# Patient Record
Sex: Female | Born: 1940 | Race: Black or African American | Hispanic: No | Smoking: Former smoker
Health system: Southern US, Community
[De-identification: ages and names within clinical notes are randomized; demographics above are authoritative.]

## PROBLEM LIST (undated history)

## (undated) DIAGNOSIS — G459 Transient cerebral ischemic attack, unspecified: Secondary | ICD-10-CM

## (undated) DIAGNOSIS — M4800 Spinal stenosis, site unspecified: Secondary | ICD-10-CM

## (undated) DIAGNOSIS — E119 Type 2 diabetes mellitus without complications: Secondary | ICD-10-CM

## (undated) DIAGNOSIS — C8590 Non-Hodgkin lymphoma, unspecified, unspecified site: Secondary | ICD-10-CM

## (undated) DIAGNOSIS — I10 Essential (primary) hypertension: Secondary | ICD-10-CM

## (undated) DIAGNOSIS — K219 Gastro-esophageal reflux disease without esophagitis: Secondary | ICD-10-CM

## (undated) DIAGNOSIS — E785 Hyperlipidemia, unspecified: Secondary | ICD-10-CM

## (undated) DIAGNOSIS — I219 Acute myocardial infarction, unspecified: Secondary | ICD-10-CM

## (undated) DIAGNOSIS — M1990 Unspecified osteoarthritis, unspecified site: Secondary | ICD-10-CM

## (undated) HISTORY — DX: Type 2 diabetes mellitus without complications: E11.9

## (undated) HISTORY — PX: TOE AMPUTATION: SHX809

## (undated) HISTORY — DX: Unspecified osteoarthritis, unspecified site: M19.90

## (undated) HISTORY — DX: Essential (primary) hypertension: I10

## (undated) HISTORY — PX: EYE SURGERY: SHX253

## (undated) HISTORY — PX: ABDOMINAL HYSTERECTOMY: SHX81

---

## 2003-09-16 ENCOUNTER — Other Ambulatory Visit: Payer: Self-pay

## 2007-01-04 DIAGNOSIS — C8590 Non-Hodgkin lymphoma, unspecified, unspecified site: Secondary | ICD-10-CM

## 2007-01-04 HISTORY — DX: Non-Hodgkin lymphoma, unspecified, unspecified site: C85.90

## 2008-08-17 ENCOUNTER — Emergency Department: Payer: Self-pay | Admitting: Internal Medicine

## 2010-01-15 ENCOUNTER — Emergency Department: Payer: Self-pay | Admitting: Emergency Medicine

## 2010-01-23 ENCOUNTER — Inpatient Hospital Stay: Payer: Self-pay | Admitting: Internal Medicine

## 2010-02-17 ENCOUNTER — Ambulatory Visit: Payer: Self-pay | Admitting: Surgery

## 2010-02-19 ENCOUNTER — Ambulatory Visit: Payer: Self-pay | Admitting: Surgery

## 2010-03-31 ENCOUNTER — Ambulatory Visit: Payer: Self-pay | Admitting: Surgery

## 2010-04-02 ENCOUNTER — Ambulatory Visit: Payer: Self-pay | Admitting: Surgery

## 2010-04-07 LAB — PATHOLOGY REPORT

## 2011-03-15 ENCOUNTER — Ambulatory Visit: Payer: Self-pay | Admitting: Oncology

## 2011-03-15 LAB — BASIC METABOLIC PANEL
Anion Gap: 12 (ref 7–16)
BUN: 21 mg/dL — ABNORMAL HIGH (ref 7–18)
Chloride: 96 mmol/L — ABNORMAL LOW (ref 98–107)
Co2: 27 mmol/L (ref 21–32)
Creatinine: 1.88 mg/dL — ABNORMAL HIGH (ref 0.60–1.30)
EGFR (African American): 34 — ABNORMAL LOW
EGFR (Non-African Amer.): 28 — ABNORMAL LOW
Potassium: 3.5 mmol/L (ref 3.5–5.1)
Sodium: 135 mmol/L — ABNORMAL LOW (ref 136–145)

## 2011-03-15 LAB — CBC CANCER CENTER
Basophil #: 0 x10 3/mm (ref 0.0–0.1)
Eosinophil #: 0.1 x10 3/mm (ref 0.0–0.7)
HCT: 36.8 % (ref 35.0–47.0)
Lymphocyte %: 22 %
MCH: 31.3 pg (ref 26.0–34.0)
MCHC: 35 g/dL (ref 32.0–36.0)
Monocyte #: 0.4 x10 3/mm (ref 0.0–0.7)
Monocyte %: 6.4 %
Neutrophil #: 4.6 x10 3/mm (ref 1.4–6.5)
Platelet: 177 x10 3/mm (ref 150–440)
RDW: 13.7 % (ref 11.5–14.5)
WBC: 6.5 x10 3/mm (ref 3.6–11.0)

## 2011-03-29 ENCOUNTER — Emergency Department: Payer: Self-pay | Admitting: Emergency Medicine

## 2011-03-29 LAB — URINALYSIS, COMPLETE
Bilirubin,UR: NEGATIVE
Leukocyte Esterase: NEGATIVE
Nitrite: NEGATIVE
Ph: 5 (ref 4.5–8.0)
Protein: NEGATIVE
RBC,UR: 1 /HPF (ref 0–5)
Specific Gravity: 1.025 (ref 1.003–1.030)
WBC UR: 2 /HPF (ref 0–5)

## 2011-03-29 LAB — COMPREHENSIVE METABOLIC PANEL
Alkaline Phosphatase: 86 U/L (ref 50–136)
Anion Gap: 12 (ref 7–16)
BUN: 22 mg/dL — ABNORMAL HIGH (ref 7–18)
Bilirubin,Total: 0.5 mg/dL (ref 0.2–1.0)
Calcium, Total: 9.6 mg/dL (ref 8.5–10.1)
Co2: 29 mmol/L (ref 21–32)
Creatinine: 1.6 mg/dL — ABNORMAL HIGH (ref 0.60–1.30)
EGFR (African American): 41 — ABNORMAL LOW
Glucose: 445 mg/dL — ABNORMAL HIGH (ref 65–99)
Osmolality: 291 (ref 275–301)
Potassium: 3.4 mmol/L — ABNORMAL LOW (ref 3.5–5.1)
SGOT(AST): 21 U/L (ref 15–37)
SGPT (ALT): 16 U/L
Total Protein: 8.2 g/dL (ref 6.4–8.2)

## 2011-03-29 LAB — CBC
HCT: 38.1 % (ref 35.0–47.0)
MCH: 30.7 pg (ref 26.0–34.0)
MCV: 90 fL (ref 80–100)
Platelet: 176 10*3/uL (ref 150–440)
RBC: 4.21 10*6/uL (ref 3.80–5.20)
WBC: 6.5 10*3/uL (ref 3.6–11.0)

## 2011-04-04 ENCOUNTER — Ambulatory Visit: Payer: Self-pay | Admitting: Oncology

## 2011-07-28 DIAGNOSIS — G4733 Obstructive sleep apnea (adult) (pediatric): Secondary | ICD-10-CM | POA: Insufficient documentation

## 2011-07-28 DIAGNOSIS — E785 Hyperlipidemia, unspecified: Secondary | ICD-10-CM | POA: Insufficient documentation

## 2011-07-28 DIAGNOSIS — Z859 Personal history of malignant neoplasm, unspecified: Secondary | ICD-10-CM | POA: Insufficient documentation

## 2011-07-28 DIAGNOSIS — E1151 Type 2 diabetes mellitus with diabetic peripheral angiopathy without gangrene: Secondary | ICD-10-CM | POA: Insufficient documentation

## 2011-07-28 DIAGNOSIS — H3530 Unspecified macular degeneration: Secondary | ICD-10-CM | POA: Insufficient documentation

## 2011-07-28 DIAGNOSIS — Z89429 Acquired absence of other toe(s), unspecified side: Secondary | ICD-10-CM | POA: Insufficient documentation

## 2011-07-28 DIAGNOSIS — R32 Unspecified urinary incontinence: Secondary | ICD-10-CM | POA: Insufficient documentation

## 2011-07-28 DIAGNOSIS — E559 Vitamin D deficiency, unspecified: Secondary | ICD-10-CM | POA: Insufficient documentation

## 2011-07-28 DIAGNOSIS — I2510 Atherosclerotic heart disease of native coronary artery without angina pectoris: Secondary | ICD-10-CM | POA: Insufficient documentation

## 2011-07-28 DIAGNOSIS — H409 Unspecified glaucoma: Secondary | ICD-10-CM | POA: Insufficient documentation

## 2011-11-29 ENCOUNTER — Ambulatory Visit: Payer: Self-pay | Admitting: Ophthalmology

## 2011-11-29 LAB — CBC WITH DIFFERENTIAL/PLATELET
Basophil #: 0.1 10*3/uL (ref 0.0–0.1)
HCT: 34.2 % — ABNORMAL LOW (ref 35.0–47.0)
Lymphocyte %: 24.5 %
MCH: 31.3 pg (ref 26.0–34.0)
MCHC: 35.2 g/dL (ref 32.0–36.0)
Monocyte #: 0.7 x10 3/mm (ref 0.2–0.9)
Monocyte %: 7.6 %
Neutrophil #: 6.1 10*3/uL (ref 1.4–6.5)
Neutrophil %: 66.1 %
RDW: 13.8 % (ref 11.5–14.5)
WBC: 9.3 10*3/uL (ref 3.6–11.0)

## 2011-11-29 LAB — BASIC METABOLIC PANEL
Anion Gap: 6 — ABNORMAL LOW (ref 7–16)
BUN: 24 mg/dL — ABNORMAL HIGH (ref 7–18)
Calcium, Total: 9.5 mg/dL (ref 8.5–10.1)
Co2: 28 mmol/L (ref 21–32)
Creatinine: 1.47 mg/dL — ABNORMAL HIGH (ref 0.60–1.30)
Glucose: 97 mg/dL (ref 65–99)
Potassium: 3.6 mmol/L (ref 3.5–5.1)
Sodium: 141 mmol/L (ref 136–145)

## 2011-12-05 DIAGNOSIS — F419 Anxiety disorder, unspecified: Secondary | ICD-10-CM | POA: Insufficient documentation

## 2011-12-07 ENCOUNTER — Ambulatory Visit: Payer: Self-pay | Admitting: Ophthalmology

## 2012-01-04 ENCOUNTER — Ambulatory Visit: Payer: Self-pay | Admitting: Oncology

## 2012-01-26 LAB — CBC CANCER CENTER
Basophil #: 0.1 x10 3/mm (ref 0.0–0.1)
Basophil %: 0.5 %
Eosinophil #: 0.2 x10 3/mm (ref 0.0–0.7)
Eosinophil %: 1.6 %
HCT: 36.3 % (ref 35.0–47.0)
HGB: 12.5 g/dL (ref 12.0–16.0)
Lymphocyte #: 2.9 x10 3/mm (ref 1.0–3.6)
Monocyte #: 0.7 x10 3/mm (ref 0.2–0.9)
Platelet: 213 x10 3/mm (ref 150–440)
RBC: 4.15 10*6/uL (ref 3.80–5.20)
RDW: 13.9 % (ref 11.5–14.5)
WBC: 10.9 x10 3/mm (ref 3.6–11.0)

## 2012-01-30 DIAGNOSIS — M1990 Unspecified osteoarthritis, unspecified site: Secondary | ICD-10-CM | POA: Insufficient documentation

## 2012-02-04 ENCOUNTER — Ambulatory Visit: Payer: Self-pay | Admitting: Oncology

## 2012-03-03 ENCOUNTER — Ambulatory Visit: Payer: Self-pay | Admitting: Oncology

## 2012-05-24 DIAGNOSIS — F09 Unspecified mental disorder due to known physiological condition: Secondary | ICD-10-CM | POA: Insufficient documentation

## 2012-07-24 ENCOUNTER — Ambulatory Visit: Payer: Self-pay | Admitting: Oncology

## 2012-07-26 LAB — CBC CANCER CENTER
Basophil #: 0 x10 3/mm (ref 0.0–0.1)
Basophil %: 0.5 %
HGB: 11.8 g/dL — ABNORMAL LOW (ref 12.0–16.0)
Lymphocyte #: 1.9 x10 3/mm (ref 1.0–3.6)
Lymphocyte %: 26.7 %
MCHC: 35.5 g/dL (ref 32.0–36.0)
MCV: 89 fL (ref 80–100)
Monocyte #: 0.6 x10 3/mm (ref 0.2–0.9)
Monocyte %: 8.2 %
Neutrophil #: 4.6 x10 3/mm (ref 1.4–6.5)
Neutrophil %: 63.5 %
Platelet: 190 x10 3/mm (ref 150–440)
RBC: 3.74 10*6/uL — ABNORMAL LOW (ref 3.80–5.20)
WBC: 7.2 x10 3/mm (ref 3.6–11.0)

## 2012-07-26 LAB — BASIC METABOLIC PANEL
Anion Gap: 10 (ref 7–16)
Calcium, Total: 9.3 mg/dL (ref 8.5–10.1)
Chloride: 98 mmol/L (ref 98–107)
Co2: 31 mmol/L (ref 21–32)
Osmolality: 293 (ref 275–301)
Potassium: 3.6 mmol/L (ref 3.5–5.1)
Sodium: 139 mmol/L (ref 136–145)

## 2012-08-03 ENCOUNTER — Ambulatory Visit: Payer: Self-pay | Admitting: Oncology

## 2012-08-27 DIAGNOSIS — M7061 Trochanteric bursitis, right hip: Secondary | ICD-10-CM | POA: Insufficient documentation

## 2012-09-06 DIAGNOSIS — M546 Pain in thoracic spine: Secondary | ICD-10-CM | POA: Insufficient documentation

## 2012-09-10 ENCOUNTER — Ambulatory Visit: Payer: Self-pay | Admitting: Specialist

## 2012-09-20 ENCOUNTER — Ambulatory Visit: Payer: Self-pay | Admitting: Specialist

## 2012-11-05 DIAGNOSIS — I70209 Unspecified atherosclerosis of native arteries of extremities, unspecified extremity: Secondary | ICD-10-CM | POA: Insufficient documentation

## 2012-11-26 ENCOUNTER — Ambulatory Visit: Payer: Self-pay | Admitting: Oncology

## 2013-01-24 ENCOUNTER — Ambulatory Visit: Payer: Self-pay | Admitting: Pain Medicine

## 2013-02-11 ENCOUNTER — Ambulatory Visit: Payer: Self-pay | Admitting: Family

## 2013-03-20 DIAGNOSIS — M5137 Other intervertebral disc degeneration, lumbosacral region: Secondary | ICD-10-CM | POA: Insufficient documentation

## 2013-04-25 ENCOUNTER — Ambulatory Visit: Payer: Self-pay | Admitting: Pain Medicine

## 2013-05-13 ENCOUNTER — Ambulatory Visit: Payer: Self-pay | Admitting: Pain Medicine

## 2013-06-20 ENCOUNTER — Ambulatory Visit: Payer: Self-pay | Admitting: Pain Medicine

## 2013-08-15 ENCOUNTER — Ambulatory Visit: Payer: Self-pay | Admitting: Pain Medicine

## 2013-08-26 ENCOUNTER — Ambulatory Visit: Payer: Self-pay | Admitting: Pain Medicine

## 2013-09-24 ENCOUNTER — Ambulatory Visit: Payer: Self-pay | Admitting: Pain Medicine

## 2013-10-02 ENCOUNTER — Ambulatory Visit: Payer: Self-pay | Admitting: Pain Medicine

## 2013-10-11 ENCOUNTER — Emergency Department: Payer: Self-pay | Admitting: Emergency Medicine

## 2013-10-11 LAB — CBC
HCT: 35.5 % (ref 35.0–47.0)
HGB: 11.8 g/dL — AB (ref 12.0–16.0)
MCH: 29.6 pg (ref 26.0–34.0)
MCHC: 33.3 g/dL (ref 32.0–36.0)
MCV: 89 fL (ref 80–100)
PLATELETS: 132 10*3/uL — AB (ref 150–440)
RBC: 4 10*6/uL (ref 3.80–5.20)
RDW: 13.8 % (ref 11.5–14.5)
WBC: 5.2 10*3/uL (ref 3.6–11.0)

## 2013-10-11 LAB — BASIC METABOLIC PANEL
Anion Gap: 8 (ref 7–16)
BUN: 21 mg/dL — ABNORMAL HIGH (ref 7–18)
Calcium, Total: 8.9 mg/dL (ref 8.5–10.1)
Chloride: 104 mmol/L (ref 98–107)
Co2: 33 mmol/L — ABNORMAL HIGH (ref 21–32)
Creatinine: 1.52 mg/dL — ABNORMAL HIGH (ref 0.60–1.30)
EGFR (African American): 43 — ABNORMAL LOW
EGFR (Non-African Amer.): 36 — ABNORMAL LOW
Glucose: 154 mg/dL — ABNORMAL HIGH (ref 65–99)
Osmolality: 295 (ref 275–301)
Potassium: 3.9 mmol/L (ref 3.5–5.1)
Sodium: 145 mmol/L (ref 136–145)

## 2013-10-11 LAB — TROPONIN I: Troponin-I: <0.02 ng/mL

## 2013-10-29 ENCOUNTER — Ambulatory Visit: Payer: Self-pay | Admitting: Pain Medicine

## 2013-11-06 ENCOUNTER — Ambulatory Visit: Payer: Self-pay | Admitting: Pain Medicine

## 2013-11-25 DIAGNOSIS — Z9181 History of falling: Secondary | ICD-10-CM | POA: Insufficient documentation

## 2013-11-25 DIAGNOSIS — H540X55 Blindness right eye category 5, blindness left eye category 5: Secondary | ICD-10-CM | POA: Insufficient documentation

## 2013-12-05 ENCOUNTER — Ambulatory Visit: Payer: Self-pay | Admitting: Pain Medicine

## 2014-01-09 ENCOUNTER — Ambulatory Visit: Payer: Self-pay | Admitting: Pain Medicine

## 2014-01-28 DIAGNOSIS — R51 Headache: Secondary | ICD-10-CM

## 2014-01-28 DIAGNOSIS — R519 Headache, unspecified: Secondary | ICD-10-CM | POA: Insufficient documentation

## 2014-03-11 ENCOUNTER — Ambulatory Visit: Payer: Self-pay | Admitting: Pain Medicine

## 2014-03-24 ENCOUNTER — Ambulatory Visit: Payer: Self-pay | Admitting: Pain Medicine

## 2014-04-07 DIAGNOSIS — R42 Dizziness and giddiness: Secondary | ICD-10-CM | POA: Insufficient documentation

## 2014-04-22 NOTE — Op Note (Signed)
PATIENT NAME:  [name], [name] MR#:  [id number] DATE OF BIRTH:  Aug 24, 1940  DATE OF PROCEDURE:  12/07/2011  PROCEDURES PERFORMED:  1. Pars plana vitrectomy of the right eye.  2. Internal limiting membrane peel of the right eye.   PREOPERATIVE DIAGNOSES:  1. Persistent diabetic macular edema.  2. Proliferative diabetic retinopathy.   POSTOPERATIVE DIAGNOSES:  1. Persistent diabetic macular edema.  2. Proliferative diabetic retinopathy.   ESTIMATED BLOOD LOSS: Less than 1 mL.  PRIMARY SURGEON: Teresa Pelton. Cynda Soule, MD  ANESTHESIA: General endotracheal anesthesia with a supplemental retrobulbar block of the right eye.   COMPLICATIONS: None.   INDICATIONS FOR PROCEDURE: This is a patient who presented to my office with decreased vision in the right eye. Multiple attempts were made to control her diabetic macular edema including intravitreal injections of multiple medications, focal retinal laser and subtenon Kenalog. None of this seemed to work. Risks, benefits, and alternatives of the above procedure were discussed and the patient wished to proceed.   DETAILS OF PROCEDURE: After informed consent was obtained, the patient was brought into the operative suite at [facility]. Patient was placed in supine position, was induced by the anesthesia team without any complications. The right eye was prepped and draped in sterile manner. After lid speculum was inserted, a small subtenons wound was created inferonasally. A subtenons tract was created using [place]. A retrobulbar block was performed via this tract. A 25-gauge trocar was then placed inferotemporally through displaced conjunctiva in an oblique fashion 3 mm beyond the limbus. The infusion cannula was turned on and inserted through the trocar and secured in position with Steri-Strips. Two more trocars were placed in a similar fashion superotemporally and superonasally. The vitreous cutter and light pipe were introduced  into the eye and a core vitrectomy was performed. Peripheral vitreous was trimmed for 360 degrees. Indocyanine green was injected onto the posterior pole and removed within 30 seconds. An internal limiting membrane peel was completed for 360 degrees around the fovea for a total of two disk diameters. Scleral depressed exam was performed for 360 degrees and no signs of any breaks or tears could be identified. A partial air-fluid exchange was performed and the trocars were removed. The wounds were noted to be airtight. 5 mg of dexamethasone was given into the inferior fornix. Pressure in the eye was confirmed to be approximately 15 mmHg and the lid speculum was removed. The eye was cleaned and TobraDex was placed in the eye. A patch and shield were placed over the eye and the patient was reversed by anesthesia and taken to postanesthesia care with instructions to remain head up.    ____________________________ Teresa Pelton. Starling Manns, MD mfa:cms D: 12/07/2011 10:57:54 ET T: 12/07/2011 11:22:37 ET JOB#: [id number]  cc: Teresa Pelton. Starling Manns, MD, <Dictator>  Coralee Rud MD ELECTRONICALLY SIGNED 12/21/2011 9:36

## 2014-04-24 ENCOUNTER — Ambulatory Visit
Admit: 2014-04-24 | Disposition: A | Discharge status: Home / Self Care | Payer: Self-pay | Attending: Pain Medicine | Admitting: Pain Medicine

## 2014-06-24 ENCOUNTER — Ambulatory Visit: Payer: Medicare (Managed Care) | Attending: Pain Medicine | Admitting: Pain Medicine

## 2014-06-24 ENCOUNTER — Encounter: Payer: Self-pay | Admitting: Pain Medicine

## 2014-06-24 VITALS — BP 121/59 | HR 66 | Temp 98.1°F | Resp 20 | Ht 64.0 in | Wt 234.0 lb

## 2014-06-24 DIAGNOSIS — M545 Low back pain, unspecified: Secondary | ICD-10-CM | POA: Insufficient documentation

## 2014-06-24 DIAGNOSIS — E1140 Type 2 diabetes mellitus with diabetic neuropathy, unspecified: Secondary | ICD-10-CM | POA: Insufficient documentation

## 2014-06-24 DIAGNOSIS — M4800 Spinal stenosis, site unspecified: Secondary | ICD-10-CM | POA: Insufficient documentation

## 2014-06-24 DIAGNOSIS — M79605 Pain in left leg: Secondary | ICD-10-CM | POA: Diagnosis present

## 2014-06-24 DIAGNOSIS — M48062 Spinal stenosis, lumbar region with neurogenic claudication: Secondary | ICD-10-CM

## 2014-06-24 DIAGNOSIS — E1340 Other specified diabetes mellitus with diabetic neuropathy, unspecified: Secondary | ICD-10-CM

## 2014-06-24 DIAGNOSIS — M5136 Other intervertebral disc degeneration, lumbar region: Secondary | ICD-10-CM | POA: Insufficient documentation

## 2014-06-24 DIAGNOSIS — M79604 Pain in right leg: Secondary | ICD-10-CM | POA: Diagnosis present

## 2014-06-24 DIAGNOSIS — M47816 Spondylosis without myelopathy or radiculopathy, lumbar region: Secondary | ICD-10-CM

## 2014-06-24 NOTE — Progress Notes (Signed)
Patient did not bring list of medications and is poor historian.  Dr Primus Bravo notified that we do not have accurate med list.

## 2014-06-24 NOTE — Progress Notes (Signed)
Safety precautions to be maintained throughout the outpatient stay will include: orient to surroundings, keep bed in low position, maintain call bell within reach at all times, provide assistance with transfer out of bed and ambulation.  

## 2014-06-24 NOTE — Patient Instructions (Addendum)
Continue present medications.  F/U PCP for evaliation of  BP and general medical  condition. We will need medical clearance from your primary care physician to proceed with lumbar epidural steroid injection  F/U surgical evaluation. We are scheduling neurosurgical evaluation for evaluation of your lower back lower extremity pain and weakness  F/U neurological evaluation  MRI of lumbar spine. We will obtain updated lumbar MRI to evaluate for disc protrusion, fractures, degenerative changes, and other abnormalities which may be contributing to lower back lower extremity pain and paresthesias  May consider radiofrequency rhizolysis or intraspinal procedures pending response to present treatment and F/U evaluation.  Patient to call Pain Management Center should patient have concerns prior to scheduled return appointment. GENERAL RISKS AND COMPLICATIONS  What are the risk, side effects and possible complications? Generally speaking, most procedures are safe.  However, with any procedure there are risks, side effects, and the possibility of complications.  The risks and complications are dependent upon the sites that are lesioned, or the type of nerve block to be performed.  The closer the procedure is to the spine, the more serious the risks are.  Great care is taken when placing the radio frequency needles, block needles or lesioning probes, but sometimes complications can occur. 1. Infection: Any time there is an injection through the skin, there is a risk of infection.  This is why sterile conditions are used for these blocks.  There are four possible types of infection. 1. Localized skin infection. 2. Central Nervous System Infection-This can be in the form of Meningitis, which can be deadly. 3. Epidural Infections-This can be in the form of an epidural abscess, which can cause pressure inside of the spine, causing compression of the spinal cord with subsequent paralysis. This would require an  emergency surgery to decompress, and there are no guarantees that the patient would recover from the paralysis. 4. Discitis-This is an infection of the intervertebral discs.  It occurs in about 1% of discography procedures.  It is difficult to treat and it may lead to surgery.        2. Pain: the needles have to go through skin and soft tissues, will cause soreness.       3. Damage to internal structures:  The nerves to be lesioned may be near blood vessels or    other nerves which can be potentially damaged.       4. Bleeding: Bleeding is more common if the patient is taking blood thinners such as  aspirin, Coumadin, Ticiid, Plavix, etc., or if he/she have some genetic predisposition  such as hemophilia. Bleeding into the spinal canal can cause compression of the spinal  cord with subsequent paralysis.  This would require an emergency surgery to  decompress and there are no guarantees that the patient would recover from the  paralysis.       5. Pneumothorax:  Puncturing of a lung is a possibility, every time a needle is introduced in  the area of the chest or upper back.  Pneumothorax refers to free air around the  collapsed lung(s), inside of the thoracic cavity (chest cavity).  Another two possible  complications related to a similar event would include: Hemothorax and Chylothorax.   These are variations of the Pneumothorax, where instead of air around the collapsed  lung(s), you may have blood or chyle, respectively.       6. Spinal headaches: They may occur with any procedures in the area of the spine.  7. Persistent CSF (Cerebro-Spinal Fluid) leakage: This is a rare problem, but may occur  with prolonged intrathecal or epidural catheters either due to the formation of a fistulous  track or a dural tear.       8. Nerve damage: By working so close to the spinal cord, there is always a possibility of  nerve damage, which could be as serious as a permanent spinal cord injury with  paralysis.        9. Death:  Although rare, severe deadly allergic reactions known as "Anaphylactic  reaction" can occur to any of the medications used.      10. Worsening of the symptoms:  We can always make thing worse.  What are the chances of something like this happening? Chances of any of this occuring are extremely low.  By statistics, you have more of a chance of getting killed in a motor vehicle accident: while driving to the hospital than any of the above occurring .  Nevertheless, you should be aware that they are possibilities.  In general, it is similar to taking a shower.  Everybody knows that you can slip, hit your head and get killed.  Does that mean that you should not shower again?  Nevertheless always keep in mind that statistics do not mean anything if you happen to be on the wrong side of them.  Even if a procedure has a 1 (one) in a 1,000,000 (million) chance of going wrong, it you happen to be that one..Also, keep in mind that by statistics, you have more of a chance of having something go wrong when taking medications.  Who should not have this procedure? If you are on a blood thinning medication (e.g. Coumadin, Plavix, see list of "Blood Thinners"), or if you have an active infection going on, you should not have the procedure.  If you are taking any blood thinners, please inform your physician.  How should I prepare for this procedure?  Do not eat or drink anything at least six hours prior to the procedure.  Bring a driver with you .  It cannot be a taxi.  Come accompanied by an adult that can drive you back, and that is strong enough to help you if your legs get weak or numb from the local anesthetic.  Take all of your medicines the morning of the procedure with just enough water to swallow them.  If you have diabetes, make sure that you are scheduled to have your procedure done first thing in the morning, whenever possible.  If you have diabetes, take only half of your insulin dose and  notify our nurse that you have done so as soon as you arrive at the clinic.  If you are diabetic, but only take blood sugar pills (oral hypoglycemic), then do not take them on the morning of your procedure.  You may take them after you have had the procedure.  Do not take aspirin or any aspirin-containing medications, at least eleven (11) days prior to the procedure.  They may prolong bleeding.  Wear loose fitting clothing that may be easy to take off and that you would not mind if it got stained with Betadine or blood.  Do not wear any jewelry or perfume  Remove any nail coloring.  It will interfere with some of our monitoring equipment.  NOTE: Remember that this is not meant to be interpreted as a complete list of all possible complications.  Unforeseen problems may occur.  BLOOD THINNERS  The following drugs contain aspirin or other products, which can cause increased bleeding during surgery and should not be taken for 2 weeks prior to and 1 week after surgery.  If you should need take something for relief of minor pain, you may take acetaminophen which is found in Tylenol,m Datril, Anacin-3 and Panadol. It is not blood thinner. The products listed below are.  Do not take any of the products listed below in addition to any listed on your instruction sheet.  A.P.C or A.P.C with Codeine Codeine Phosphate Capsules #3 Ibuprofen Ridaura  ABC compound Congesprin Imuran rimadil  Advil Cope Indocin Robaxisal  Alka-Seltzer Effervescent Pain Reliever and Antacid Coricidin or Coricidin-D  Indomethacin Rufen  Alka-Seltzer plus Cold Medicine Cosprin Ketoprofen S-A-C Tablets  Anacin Analgesic Tablets or Capsules Coumadin Korlgesic Salflex  Anacin Extra Strength Analgesic tablets or capsules CP-2 Tablets Lanoril Salicylate  Anaprox Cuprimine Capsules Levenox Salocol  Anexsia-D Dalteparin Magan Salsalate  Anodynos Darvon compound Magnesium Salicylate Sine-off  Ansaid Dasin Capsules Magsal Sodium  Salicylate  Anturane Depen Capsules Marnal Soma  APF Arthritis pain formula Dewitt's Pills Measurin Stanback  Argesic Dia-Gesic Meclofenamic Sulfinpyrazone  Arthritis Bayer Timed Release Aspirin Diclofenac Meclomen Sulindac  Arthritis pain formula Anacin Dicumarol Medipren Supac  Analgesic (Safety coated) Arthralgen Diffunasal Mefanamic Suprofen  Arthritis Strength Bufferin Dihydrocodeine Mepro Compound Suprol  Arthropan liquid Dopirydamole Methcarbomol with Aspirin Synalgos  ASA tablets/Enseals Disalcid Micrainin Tagament  Ascriptin Doan's Midol Talwin  Ascriptin A/D Dolene Mobidin Tanderil  Ascriptin Extra Strength Dolobid Moblgesic Ticlid  Ascriptin with Codeine Doloprin or Doloprin with Codeine Momentum Tolectin  Asperbuf Duoprin Mono-gesic Trendar  Aspergum Duradyne Motrin or Motrin IB Triminicin  Aspirin plain, buffered or enteric coated Durasal Myochrisine Trigesic  Aspirin Suppositories Easprin Nalfon Trillsate  Aspirin with Codeine Ecotrin Regular or Extra Strength Naprosyn Uracel  Atromid-S Efficin Naproxen Ursinus  Auranofin Capsules Elmiron Neocylate Vanquish  Axotal Emagrin Norgesic Verin  Azathioprine Empirin or Empirin with Codeine Normiflo Vitamin E  Azolid Emprazil Nuprin Voltaren  Bayer Aspirin plain, buffered or children's or timed BC Tablets or powders Encaprin Orgaran Warfarin Sodium  Buff-a-Comp Enoxaparin Orudis Zorpin  Buff-a-Comp with Codeine Equegesic Os-Cal-Gesic   Buffaprin Excedrin plain, buffered or Extra Strength Oxalid   Bufferin Arthritis Strength Feldene Oxphenbutazone   Bufferin plain or Extra Strength Feldene Capsules Oxycodone with Aspirin   Bufferin with Codeine Fenoprofen Fenoprofen Pabalate or Pabalate-SF   Buffets II Flogesic Panagesic   Buffinol plain or Extra Strength Florinal or Florinal with Codeine Panwarfarin   Buf-Tabs Flurbiprofen Penicillamine   Butalbital Compound Four-way cold tablets Penicillin   Butazolidin Fragmin Pepto-Bismol    Carbenicillin Geminisyn Percodan   Carna Arthritis Reliever Geopen Persantine   Carprofen Gold's salt Persistin   Chloramphenicol Goody's Phenylbutazone   Chloromycetin Haltrain Piroxlcam   Clmetidine heparin Plaquenil   Cllnoril Hyco-pap Ponstel   Clofibrate Hydroxy chloroquine Propoxyphen         Before stopping any of these medications, be sure to consult the physician who ordered them.  Some, such as Coumadin (Warfarin) are ordered to prevent or treat serious conditions such as "deep thrombosis", "pumonary embolisms", and other heart problems.  The amount of time that you may need off of the medication may also vary with the medication and the reason for which you were taking it.  If you are taking any of these medications, please make sure you notify your pain physician before you undergo any procedures.         Epidural Steroid Injection  An epidural steroid injection is given to relieve pain in your neck, back, or legs that is caused by the irritation or swelling of a nerve root. This procedure involves injecting a steroid and numbing medicine (anesthetic) into the epidural space. The epidural space is the space between the outer covering of your spinal cord and the bones that form your backbone (vertebra).  LET [facility] CARE PROVIDER KNOW ABOUT:  2. Any allergies you have. 3. All medicines you are taking, including vitamins, herbs, eye drops, creams, and over-the-counter medicines such as aspirin. 4. Previous problems you or members of your family have had with the use of anesthetics. 5. Any blood disorders or blood clotting disorders you have. 6. Previous surgeries you have had. 7. Medical conditions you have. RISKS AND COMPLICATIONS Generally, this is a safe procedure. However, as with any procedure, complications can occur. Possible complications of epidural steroid injection include:  Headache.  Bleeding.  Infection.  Allergic reaction to the medicines.  Damage  to your nerves. The response to this procedure depends on the underlying cause of the pain and its duration. People who have long-term (chronic) pain are less likely to benefit from epidural steroids than are those people whose pain comes on strong and suddenly. BEFORE THE PROCEDURE   Ask your health care provider about changing or stopping your regular medicines. You may be advised to stop taking blood-thinning medicines a few days before the procedure.  You may be given medicines to reduce anxiety.  Arrange for someone to take you home after the procedure. PROCEDURE   You will remain awake during the procedure. You may receive medicine to make you relaxed.  You will be asked to lie on your stomach.  The injection site will be cleaned.  The injection site will be numbed with a medicine (local anesthetic).  A needle will be injected through your skin into the epidural space.  Your health care provider will use an X-ray machine to ensure that the steroid is delivered closest to the affected nerve. You may have minimal discomfort at this time.  Once the needle is in the right position, the local anesthetic and the steroid will be injected into the epidural space.  The needle will then be removed and a bandage will be applied to the injection site. AFTER THE PROCEDURE  12. You may be monitored for a short time before you go home. 13. You may feel weakness or numbness in your arm or leg, which disappears within hours. 14. You may be allowed to eat, drink, and take your regular medicine. 15. You may have soreness at the site of the injection. Document Released: 03/29/2007 Document Revised: 08/22/2012 Document Reviewed: 06/08/2012 [facility] Patient Information 2015 [place], [place]. This information is not intended to replace advice given to you by your health care provider. Make sure you discuss any questions you have with your health care provider.

## 2014-06-24 NOTE — Progress Notes (Signed)
   Subjective:    Patient ID: [name], female    DOB: 1940/10/04, 74 y.o.   MRN: [id number]  HPI  Patient is 74 year old female returns to [facility] for further evaluation and treatment of pain involving the lower back lower extremity region. Patient states that she has lower back lower extremity pain which interferes with ability to walk and perform other activities of daily living without experiencing severe disabling lower back lower extremity pain paresthesias and weakness. We informed patient that we wish to proceed with further evaluations including updated MRI of the lumbar spine as well as schedule patient for neurosurgical evaluation and will consider patient for lumbar epidural steroid injection of the treatment pending medical clearance by patient's primary care physician and pending response to treatment regimen at this time. We will proceed with lumbar MRI schedule neurosurgical evaluation and will consider patient for lumbar epidural steroid injection pending medical clearance the patient undergo procedure and follow-up evaluation and pain management Center. The patient was understanding and agrees to treatment plan     Review of Systems     Objective:   Physical Exam There was tenderness of the splenius capitis and occipitalis musculature regions a mild degree with with palpation of the cervical facet cervical paraspinal musculature region reproducing mild discomfort. Palpation over the region of the thoracic facet thoracic paraspinal musculature region was a tends to palpation of mild degree patient appeared to be without increased pain of significant degree with Tinel and Phalen's maneuver. Palpation over the region of the lumbar facet lumbar paraspinal musculature region was a tends to palpation of moderate degree. With no crepitus of the thoracic region noted. Tinel and Phalen's maneuver without increased pain of significant degree and patient appeared to be with  decreased grip strength. Palpation over the lumbar facet lumbar paraspinal musculature region was a tends to palpation of moderate degree with moderate tenderness over the PSIS and PII S regions. Straight leg raising was tolerates approximately 20 without increase of pain with dorsiflexion noted. There was negative clonus negative Homans. EHL strength slightly decreased without definite sensory deficit of dermatomal distribution detected.  Abdomen soft nontender without excessive tends to palpation and there was no costovertebral angle tenderness noted       Assessment & Plan:  Degenerative disc disease lumbar spine  Spinal stenosis with neurogenic claudication   Diabetes mellitus with diabetic neuropathy   Equal iliac joint dysfunction     Continue present medications.  Lumbar epidural steroid injection Lumbar epidural steroid injection to be performed at time return appointment pending medical clearance for lumbar epidural steroid injection for patient with history of diabetes mellitus hypertension and other abnormalities  F/U PCP for evaliation of  BP and general medical  condition.  F/U surgical evaluation. We scheduled neurosurgical evaluation of lumbar and lower cavity pain and paresthesias and weakness  Lumbar MRI We scheduled  lumbar MRI to evaluate patient's lumbar and lower extremity pain paresthesias and weakness. We will evaluate patient for herniated nucleus pulposus the, degenerative disc disease lumbar spine, facet arthropathy, and other abnormalities to explain patient's symptomatology  F/U neurological evaluation.  May consider radiofrequency rhizolysis or intraspinal procedures pending response to present treatment and F/U evaluation.  Patient to call Pain Management Center should patient have concerns prior to scheduled return appointment.

## 2014-06-24 NOTE — Progress Notes (Signed)
Patient discharge via wheelchair accompanied by CNA Discharge instructions given for procedure.LESI Faxed clearance to Dr Durenda Guthrie to have procedure done

## 2014-07-02 ENCOUNTER — Ambulatory Visit
Admission: RE | Admit: 2014-07-02 | Discharge: 2014-07-02 | Disposition: A | Discharge status: Home / Self Care | Payer: Medicare (Managed Care) | Source: Ambulatory Visit | Attending: Pain Medicine | Admitting: Pain Medicine

## 2014-07-02 DIAGNOSIS — M4806 Spinal stenosis, lumbar region: Secondary | ICD-10-CM | POA: Diagnosis not present

## 2014-07-02 DIAGNOSIS — M5126 Other intervertebral disc displacement, lumbar region: Secondary | ICD-10-CM | POA: Diagnosis not present

## 2014-07-02 DIAGNOSIS — M5136 Other intervertebral disc degeneration, lumbar region: Secondary | ICD-10-CM | POA: Diagnosis present

## 2014-07-02 DIAGNOSIS — E1340 Other specified diabetes mellitus with diabetic neuropathy, unspecified: Secondary | ICD-10-CM

## 2014-07-02 DIAGNOSIS — M47816 Spondylosis without myelopathy or radiculopathy, lumbar region: Secondary | ICD-10-CM

## 2014-07-02 DIAGNOSIS — M79673 Pain in unspecified foot: Secondary | ICD-10-CM | POA: Insufficient documentation

## 2014-07-02 DIAGNOSIS — M48062 Spinal stenosis, lumbar region with neurogenic claudication: Secondary | ICD-10-CM

## 2014-07-03 DIAGNOSIS — M48061 Spinal stenosis, lumbar region without neurogenic claudication: Secondary | ICD-10-CM | POA: Insufficient documentation

## 2014-07-03 NOTE — Progress Notes (Signed)
Attempted to call patient. Mobile number is not valid. Home number was answered by voicemail by someone named  Gerald Stabs. Did not leave a message, as I do not know if this the patient's phone number. No PCP is listed for this patient, cannot send clearance request at this time. Will try home number again later.

## 2014-07-03 NOTE — Progress Notes (Signed)
Attempted to call patient. The mobile number listed is not valid. The home number was answered by voice mail by someone named Gerald Stabs. No message was left, as I was not sure this is a correct number.  No PCP listed, so cannot send clearance request at this time. Will try home number again later.

## 2014-07-03 NOTE — Progress Notes (Signed)
Patient already has appt. For lesi.

## 2014-07-16 ENCOUNTER — Ambulatory Visit: Payer: Medicaid Other | Admitting: Pain Medicine

## 2014-07-21 ENCOUNTER — Ambulatory Visit: Payer: Medicare (Managed Care) | Attending: Pain Medicine | Admitting: Pain Medicine

## 2014-07-21 VITALS — BP 107/63 | HR 74 | Temp 96.9°F | Resp 16 | Ht 65.0 in | Wt 244.0 lb

## 2014-07-21 DIAGNOSIS — M5126 Other intervertebral disc displacement, lumbar region: Secondary | ICD-10-CM | POA: Diagnosis not present

## 2014-07-21 DIAGNOSIS — M79605 Pain in left leg: Secondary | ICD-10-CM | POA: Diagnosis present

## 2014-07-21 DIAGNOSIS — M4806 Spinal stenosis, lumbar region: Secondary | ICD-10-CM | POA: Insufficient documentation

## 2014-07-21 DIAGNOSIS — M48062 Spinal stenosis, lumbar region with neurogenic claudication: Secondary | ICD-10-CM

## 2014-07-21 DIAGNOSIS — E1340 Other specified diabetes mellitus with diabetic neuropathy, unspecified: Secondary | ICD-10-CM

## 2014-07-21 DIAGNOSIS — M545 Low back pain: Secondary | ICD-10-CM | POA: Diagnosis present

## 2014-07-21 DIAGNOSIS — M5136 Other intervertebral disc degeneration, lumbar region: Secondary | ICD-10-CM

## 2014-07-21 DIAGNOSIS — M79604 Pain in right leg: Secondary | ICD-10-CM | POA: Diagnosis present

## 2014-07-21 DIAGNOSIS — M47816 Spondylosis without myelopathy or radiculopathy, lumbar region: Secondary | ICD-10-CM

## 2014-07-21 MED ORDER — MIDAZOLAM HCL 5 MG/5ML IJ SOLN
INTRAMUSCULAR | Status: AC
  Administered 2014-07-21: 1 mg via INTRAVENOUS
  Filled 2014-07-21: qty 5

## 2014-07-21 MED ORDER — LIDOCAINE HCL (PF) 1 % IJ SOLN
INTRAMUSCULAR | Status: AC
  Administered 2014-07-21: 15:00:00
  Filled 2014-07-21: qty 5

## 2014-07-21 MED ORDER — BUPIVACAINE HCL (PF) 0.25 % IJ SOLN
INTRAMUSCULAR | Status: AC
  Filled 2014-07-21: qty 30

## 2014-07-21 MED ORDER — FENTANYL CITRATE (PF) 100 MCG/2ML IJ SOLN
INTRAMUSCULAR | Status: AC
  Filled 2014-07-21: qty 2

## 2014-07-21 MED ORDER — CIPROFLOXACIN HCL 250 MG PO TABS: 250.0000 mg | ORAL_TABLET | Freq: Two times a day (BID) | ORAL | Status: DC

## 2014-07-21 MED ORDER — ORPHENADRINE CITRATE 30 MG/ML IJ SOLN
INTRAMUSCULAR | Status: AC
  Filled 2014-07-21: qty 2

## 2014-07-21 MED ORDER — CIPROFLOXACIN IN D5W 400 MG/200ML IV SOLN
INTRAVENOUS | Status: AC
  Administered 2014-07-21: 400 mg
  Filled 2014-07-21: qty 200

## 2014-07-21 MED ORDER — SODIUM CHLORIDE 0.9 % IJ SOLN
INTRAMUSCULAR | Status: AC
  Administered 2014-07-21: 15:00:00
  Filled 2014-07-21: qty 20

## 2014-07-21 MED ORDER — TRIAMCINOLONE ACETONIDE 40 MG/ML IJ SUSP
INTRAMUSCULAR | Status: AC
  Administered 2014-07-21: 15:00:00
  Filled 2014-07-21: qty 1

## 2014-07-21 NOTE — Patient Instructions (Addendum)
Continue present medications and begin taking Cipro antibiotic today as prescribed  F/U PCP Dr. Coralie Common for evaliation of  BP, diabetes mellitus,  and general medical  condition.  F/U surgical evaluation  F/U neurological evaluation  May consider radiofrequency rhizolysis or intraspinal procedures pending response to present treatment and F/U evaluation.  Patient to call Pain Management Center should patient have concerns prior to scheduled return appointment. Pain Management Discharge Instructions  General Discharge Instructions :  If you need to reach your doctor call: Monday-Friday 8:00 am - 4:00 pm at [phone] or toll free [phone].  After clinic hours [phone] to have operator reach doctor.  Bring all of your medication bottles to all your appointments in the pain clinic.  To cancel or reschedule your appointment with Pain Management please remember to call 24 hours in advance to avoid a fee.  Refer to the educational materials which you have been given on: General Risks, I had my Procedure. Discharge Instructions, Post Sedation.  Post Procedure Instructions:  The drugs you were given will stay in your system until tomorrow, so for the next 24 hours you should not drive, make any legal decisions or drink any alcoholic beverages.  You may eat anything you prefer, but it is better to start with liquids then soups and crackers, and gradually work up to solid foods.  Please notify your doctor immediately if you have any unusual bleeding, trouble breathing or pain that is not related to your normal pain.  Depending on the type of procedure that was done, some parts of your body may feel week and/or numb.  This usually clears up by tonight or the next day.  Walk with the use of an assistive device or accompanied by an adult for the 24 hours.  You may use ice on the affected area for the first 24 hours.  Put ice in a Ziploc bag and cover with a towel and place against  area 15 minutes on 15 minutes off.  You may switch to heat after 24 hours.Epidural Steroid Injection Patient Information  Description: The epidural space surrounds the nerves as they exit the spinal cord.  In some patients, the nerves can be compressed and inflamed by a bulging disc or a tight spinal canal (spinal stenosis).  By injecting steroids into the epidural space, we can bring irritated nerves into direct contact with a potentially helpful medication.  These steroids act directly on the irritated nerves and can reduce swelling and inflammation which often leads to decreased pain.  Epidural steroids may be injected anywhere along the spine and from the neck to the low back depending upon the location of your pain.   After numbing the skin with local anesthetic (like Novocaine), a small needle is passed into the epidural space slowly.  You may experience a sensation of pressure while this is being done.  The entire block usually last less than 10 minutes.  Conditions which may be treated by epidural steroids:   Low back and leg pain  Neck and arm pain  Spinal stenosis  Post-laminectomy syndrome  Herpes zoster (shingles) pain  Pain from compression fractures  Preparation for the injection:  1. Do not eat any solid food or dairy products within 6 hours of your appointment.  2. You may drink clear liquids up to 2 hours before appointment.  Clear liquids include water, black coffee, juice or soda.  No milk or cream please. 3. You may take your regular medication, including pain medications, with a  sip of water before your appointment  Diabetics should hold regular insulin (if taken separately) and take 1/2 normal NPH dos the morning of the procedure.  Carry some sugar containing items with you to your appointment. 4. A driver must accompany you and be prepared to drive you home after your procedure.  5. Bring all your current medications with your. 6. An IV may be inserted and sedation  may be given at the discretion of the physician.   7. A blood pressure cuff, EKG and other monitors will often be applied during the procedure.  Some patients may need to have extra oxygen administered for a short period. 8. You will be asked to provide medical information, including your allergies, prior to the procedure.  We must know immediately if you are taking blood thinners (like Coumadin/Warfarin)  Or if you are allergic to IV iodine contrast (dye). We must know if you could possible be pregnant.  Possible side-effects:  Bleeding from needle site  Infection (rare, may require surgery)  Nerve injury (rare)  Numbness & tingling (temporary)  Difficulty urinating (rare, temporary)  Spinal headache ( a headache worse with upright posture)  Light -headedness (temporary)  Pain at injection site (several days)  Decreased blood pressure (temporary)  Weakness in arm/leg (temporary)  Pressure sensation in back/neck (temporary)  Call if you experience:  Fever/chills associated with headache or increased back/neck pain.  Headache worsened by an upright position.  New onset weakness or numbness of an extremity below the injection site  Hives or difficulty breathing (go to the emergency room)  Inflammation or drainage at the infection site  Severe back/neck pain  Any new symptoms which are concerning to you  Please note:  Although the local anesthetic injected can often make your back or neck feel good for several hours after the injection, the pain will likely return.  It takes 3-7 days for steroids to work in the epidural space.  You may not notice any pain relief for at least that one week.  If effective, we will often do a series of three injections spaced 3-6 weeks apart to maximally decrease your pain.  After the initial series, we generally will wait several months before considering a repeat injection of the same type.  If you have any questions, please call [phone] [facility]

## 2014-07-21 NOTE — Progress Notes (Signed)
Script in hand for cipro.

## 2014-07-21 NOTE — Progress Notes (Signed)
Safety precautions to be maintained throughout the outpatient stay will include: orient to surroundings, keep bed in low position, maintain call bell within reach at all times, provide assistance with transfer out of bed and ambulation.  Called Dr. Durenda Guthrie and received approval for patient to have the lumbar epidural procedure done today; witnessed by Festus Barren, RN at 1235hrs on July 18,2016. Re-faxed the medication request which was sent in June. pATIENT IS FROM [facility] [place]; GETS MEDICATION FROM [facility] [phone]

## 2014-07-21 NOTE — Progress Notes (Signed)
   Subjective:    Patient ID: [name], female    DOB: 22-Jun-1940, 74 y.o.   MRN: [id number]  HPI  PROCEDURE PERFORMED: Lumbar epidural steroid injection   NOTE: The patient is a 74 y.o. female who returns to [place] for further evaluation and treatment of pain involving the lumbar and lower extremity region. MRI revealed the patient to be with increased size of L4-5 disc herniation, now with severe multifactorial spinal stenosis and right greater than left lateral recess stenosis at this level. Unchanged disc and facet degeneration noted elsewhere throughout the spine. The risks, benefits, and expectations of the procedure have been discussed and explained to the patient who was understanding and in agreement with suggested treatment plan. We will proceed with lumbar epidural steroid injection as discussed and as explained to the patient who is willing to proceed with procedure as planned.   DESCRIPTION OF PROCEDURE: Lumbar epidural steroid injection with IV Versed, IV fentanyl conscious sedation, EKG, blood pressure, pulse, and pulse oximetry monitoring. The procedure was performed with the patient in the prone position under fluoroscopic guidance. A local anesthetic skin wheal of 1.5% plain lidocaine was accomplished at proposed entry site. An 18-gauge Tuohy epidural needle was inserted at the L 4 vertebral body level right of the midline via loss-of-resistance technique with negative heme and negative CSF return. A total of 4 mL of Preservative-Free normal saline with 40 mg of Kenalog injected incrementally via epidurally placed needle. Needle was removed.    A total of 40 mg of Kenalog was utilized for the procedure.   The patient tolerated the injection well.    PLAN:   1. Medications: We will continue presently prescribed medications . Patient was given prescription for Cipro to take as prescribed prophylactically 2. Will consider modification of treatment regimen  pending response to treatment rendered on today's visit and follow-up evaluation. 3. The patient is to follow-up with primary care physician, Dr Coralie Common  regarding blood pressure, diabetes mellitus, and general medical condition status post lumbar epidural steroid injection performed on today's visit. 4. Surgical evaluation.. Neurosurgical reevaluation was scheduled for evaluation of patient's lumbar and lower extremity pain and weakness  5. Neurological evaluation. 6. The patient may be a candidate for radiofrequency procedures, implantation device, and other treatment pending response to treatment and follow-up evaluation. 7. The patient has been advised to adhere to proper body mechanics and avoid activities which appear to aggravate condition. 8. The patient has been advised to call the Pain Management Center prior to scheduled return appointment should there be significant change in condition or should there be sign  The patient is understanding and agrees with the suggested  treatment plan     Review of Systems     Objective:   Physical Exam        Assessment & Plan:

## 2014-07-22 ENCOUNTER — Telehealth: Payer: Self-pay | Admitting: *Deleted

## 2014-07-22 NOTE — Telephone Encounter (Signed)
Tried calling number listed.. No voice mail.

## 2014-08-12 ENCOUNTER — Encounter: Payer: Self-pay | Admitting: Pain Medicine

## 2014-08-12 ENCOUNTER — Ambulatory Visit: Payer: Medicare (Managed Care) | Attending: Pain Medicine | Admitting: Pain Medicine

## 2014-08-12 VITALS — BP 105/22 | HR 66 | Temp 98.2°F | Resp 16 | Ht 65.0 in | Wt 232.0 lb

## 2014-08-12 DIAGNOSIS — M5136 Other intervertebral disc degeneration, lumbar region: Secondary | ICD-10-CM | POA: Diagnosis not present

## 2014-08-12 DIAGNOSIS — M79605 Pain in left leg: Secondary | ICD-10-CM | POA: Diagnosis present

## 2014-08-12 DIAGNOSIS — E1140 Type 2 diabetes mellitus with diabetic neuropathy, unspecified: Secondary | ICD-10-CM | POA: Insufficient documentation

## 2014-08-12 DIAGNOSIS — E1340 Other specified diabetes mellitus with diabetic neuropathy, unspecified: Secondary | ICD-10-CM

## 2014-08-12 DIAGNOSIS — M79604 Pain in right leg: Secondary | ICD-10-CM | POA: Diagnosis present

## 2014-08-12 DIAGNOSIS — M47816 Spondylosis without myelopathy or radiculopathy, lumbar region: Secondary | ICD-10-CM

## 2014-08-12 DIAGNOSIS — M4806 Spinal stenosis, lumbar region: Secondary | ICD-10-CM | POA: Diagnosis not present

## 2014-08-12 DIAGNOSIS — M533 Sacrococcygeal disorders, not elsewhere classified: Secondary | ICD-10-CM | POA: Insufficient documentation

## 2014-08-12 DIAGNOSIS — M48062 Spinal stenosis, lumbar region with neurogenic claudication: Secondary | ICD-10-CM

## 2014-08-12 DIAGNOSIS — M545 Low back pain: Secondary | ICD-10-CM | POA: Diagnosis present

## 2014-08-12 NOTE — Progress Notes (Signed)
   Subjective:    Patient ID: [name], female    DOB: 15-Nov-1940, 74 y.o.   MRN: [id number]  HPI  Patient is 74 year old female who returns to [place] for further evaluation and treatment of pain involving the lower back lower extremity region. Patient states that she is unable to stand or walk for any significant period of time due to the severe lower back lower extremity pain which is aggravated by standing and walking. Patient has had significant improvement with prior interventional treatment and Pain Management Center. At this time we will request medical clearance for patient undergo lumbar epidural steroid injection in attempt to decrease severity of symptoms, and last progression of patient's symptoms, and avoid the need for more involved treatment. The patient was understanding and in agreement with suggested treatment plan. Patient denied any trauma change in events of daily living the call significant change in symptomatology.    Review of Systems     Objective:   Physical Exam  There was mild tenderness of the splenius capitis and occipitalis musculature region. There was mild tenderness over the cervical facet cervical paraspinal musculature region as well as the thoracic facet thoracic paraspinal musculature region. There was unremarkable Spurling maneuver. Patient appeared to be with bilaterally equal grip strength. Tinel and Phalen's maneuver were without increase of pain of any significant degree. Palpation over the region of the thoracic paraspinal muscles reproducing thoracic facet region was with mild tends to palpation with no crepitus of the thoracic region noted. Palpation of the lower thoracic paraspinal muscles region was with moderate tenderness to palpation with moderate muscle spasms. Palpation over the lumbar paraspinal muscles region lumbar facet region was with moderate to moderately severe discomfort. Straight leg raising was tolerates  approximately 20 without increased pain dorsiflexion noted. There was questionable decreased EHL strength. There appeared to be decreased sensation of the lower extremities in a stocking type distribution. There was negative clonus negative Homans. There was mild tenderness along the greater trochanteric region iliotibial band region. The abdomen was nontender with no costovertebral angle tenderness noted.    Assessment & Plan:    Degenerative disc disease lumbar spine  Spinal stenosis with neurogenic claudication   Diabetes mellitus with diabetic neuropathy   Sacroiliac joint dysfunction   Plan  Continue present medications  Lumbar epidural steroid injection to be performed at time return appointment pending medical clearance  F/U PCP Dr. Rolena Infante and Dr.Reilly medical clearance for lumbar epidural steroid injection and for evaliation of  BP, diabetes mellitus, and general medical  condition  F/U surgical evaluation as discussed  F/U neurological evaluation  May consider radiofrequency rhizolysis or intraspinal procedures pending response to present treatment and F/U evaluation   Patient to call Pain Management Center should patient have concerns prior to scheduled return appointmen.

## 2014-08-12 NOTE — Patient Instructions (Addendum)
Continue present medications  Lumbar epidural steroid injection to be performed at time return appointment pending medical clearance by your primary care physician  F/U PCP Dr. Rolena Infante or Dr.Reilly   for evaliation of  BP, diabetes mellitus, and general medical  condition  F/U surgical evaluation. Neurosurgical evaluation of lower back and lower extremity pain as discussed  F/U neurological evaluation  May consider radiofrequency rhizolysis or intraspinal procedures pending response to present treatment and F/U evaluation   Patient to call Pain Management Center should patient have concerns prior to scheduled return appointmen.  SStop Aspirin 5 days before your procedure. Epidural Steroid Injection Patient Information  Description: The epidural space surrounds the nerves as they exit the spinal cord.  In some patients, the nerves can be compressed and inflamed by a bulging disc or a tight spinal canal (spinal stenosis).  By injecting steroids into the epidural space, we can bring irritated nerves into direct contact with a potentially helpful medication.  These steroids act directly on the irritated nerves and can reduce swelling and inflammation which often leads to decreased pain.  Epidural steroids may be injected anywhere along the spine and from the neck to the low back depending upon the location of your pain.   After numbing the skin with local anesthetic (like Novocaine), a small needle is passed into the epidural space slowly.  You may experience a sensation of pressure while this is being done.  The entire block usually last less than 10 minutes.  Conditions which may be treated by epidural steroids:   Low back and leg pain  Neck and arm pain  Spinal stenosis  Post-laminectomy syndrome  Herpes zoster (shingles) pain  Pain from compression fractures  Preparation for the injection:  1. Do not eat any solid food or dairy products within 6 hours of your appointment.  2. You  may drink clear liquids up to 2 hours before appointment.  Clear liquids include water, black coffee, juice or soda.  No milk or cream please. 3. You may take your regular medication, including pain medications, with a sip of water before your appointment  Diabetics should hold regular insulin (if taken separately) and take 1/2 normal NPH dos the morning of the procedure.  Carry some sugar containing items with you to your appointment. 4. A driver must accompany you and be prepared to drive you home after your procedure.  5. Bring all your current medications with your. 6. An IV may be inserted and sedation may be given at the discretion of the physician.   7. A blood pressure cuff, EKG and other monitors will often be applied during the procedure.  Some patients may need to have extra oxygen administered for a short period. 8. You will be asked to provide medical information, including your allergies, prior to the procedure.  We must know immediately if you are taking blood thinners (like Coumadin/Warfarin)  Or if you are allergic to IV iodine contrast (dye). We must know if you could possible be pregnant.  Possible side-effects:  Bleeding from needle site  Infection (rare, may require surgery)  Nerve injury (rare)  Numbness & tingling (temporary)  Difficulty urinating (rare, temporary)  Spinal headache ( a headache worse with upright posture)  Light -headedness (temporary)  Pain at injection site (several days)  Decreased blood pressure (temporary)  Weakness in arm/leg (temporary)  Pressure sensation in back/neck (temporary)  Call if you experience:  Fever/chills associated with headache or increased back/neck pain.  Headache worsened by  an upright position.  New onset weakness or numbness of an extremity below the injection site  Hives or difficulty breathing (go to the emergency room)  Inflammation or drainage at the infection site  Severe back/neck pain  Any new  symptoms which are concerning to you  Please note:  Although the local anesthetic injected can often make your back or neck feel good for several hours after the injection, the pain will likely return.  It takes 3-7 days for steroids to work in the epidural space.  You may not notice any pain relief for at least that one week.  If effective, we will often do a series of three injections spaced 3-6 weeks apart to maximally decrease your pain.  After the initial series, we generally will wait several months before considering a repeat injection of the same type.  If you have any questions, please call [phone] [facility]

## 2014-08-12 NOTE — Progress Notes (Signed)
Safety precautions to be maintained throughout the outpatient stay will include: orient to surroundings, keep bed in low position, maintain call bell within reach at all times, provide assistance with transfer out of bed and ambulation.  

## 2014-08-20 ENCOUNTER — Encounter: Payer: Self-pay | Admitting: Pain Medicine

## 2014-08-20 ENCOUNTER — Ambulatory Visit: Payer: PRIVATE HEALTH INSURANCE | Admitting: Pain Medicine

## 2014-08-20 ENCOUNTER — Ambulatory Visit: Payer: Medicare (Managed Care) | Attending: Pain Medicine | Admitting: Pain Medicine

## 2014-08-20 VITALS — BP 142/84 | HR 74 | Temp 98.4°F | Resp 20 | Ht 65.0 in | Wt 220.0 lb

## 2014-08-20 DIAGNOSIS — M5136 Other intervertebral disc degeneration, lumbar region: Secondary | ICD-10-CM

## 2014-08-20 DIAGNOSIS — E1340 Other specified diabetes mellitus with diabetic neuropathy, unspecified: Secondary | ICD-10-CM

## 2014-08-20 DIAGNOSIS — M48062 Spinal stenosis, lumbar region with neurogenic claudication: Secondary | ICD-10-CM

## 2014-08-20 DIAGNOSIS — M5126 Other intervertebral disc displacement, lumbar region: Secondary | ICD-10-CM | POA: Diagnosis not present

## 2014-08-20 DIAGNOSIS — M79605 Pain in left leg: Secondary | ICD-10-CM | POA: Diagnosis present

## 2014-08-20 DIAGNOSIS — M4806 Spinal stenosis, lumbar region: Secondary | ICD-10-CM | POA: Insufficient documentation

## 2014-08-20 DIAGNOSIS — M545 Low back pain: Secondary | ICD-10-CM | POA: Diagnosis present

## 2014-08-20 DIAGNOSIS — M47816 Spondylosis without myelopathy or radiculopathy, lumbar region: Secondary | ICD-10-CM | POA: Diagnosis not present

## 2014-08-20 DIAGNOSIS — M79604 Pain in right leg: Secondary | ICD-10-CM | POA: Diagnosis present

## 2014-08-20 MED ORDER — BUPIVACAINE HCL (PF) 0.25 % IJ SOLN
INTRAMUSCULAR | Status: AC
  Administered 2014-08-20: 14:00:00
  Filled 2014-08-20: qty 30

## 2014-08-20 MED ORDER — LIDOCAINE HCL (PF) 1 % IJ SOLN
INTRAMUSCULAR | Status: AC
  Administered 2014-08-20: 14:00:00
  Filled 2014-08-20: qty 5

## 2014-08-20 MED ORDER — CIPROFLOXACIN HCL 250 MG PO TABS: 250.0000 mg | ORAL_TABLET | Freq: Two times a day (BID) | ORAL | Status: DC

## 2014-08-20 MED ORDER — ORPHENADRINE CITRATE 30 MG/ML IJ SOLN
INTRAMUSCULAR | Status: AC
  Filled 2014-08-20: qty 2

## 2014-08-20 MED ORDER — TRIAMCINOLONE ACETONIDE 40 MG/ML IJ SUSP
INTRAMUSCULAR | Status: AC
  Administered 2014-08-20: 14:00:00
  Filled 2014-08-20: qty 1

## 2014-08-20 MED ORDER — CIPROFLOXACIN IN D5W 400 MG/200ML IV SOLN
INTRAVENOUS | Status: AC
  Filled 2014-08-20: qty 200

## 2014-08-20 MED ORDER — SODIUM CHLORIDE 0.9 % IJ SOLN
INTRAMUSCULAR | Status: AC
  Administered 2014-08-20: 14:00:00
  Filled 2014-08-20: qty 20

## 2014-08-20 MED ORDER — MIDAZOLAM HCL 5 MG/5ML IJ SOLN
INTRAMUSCULAR | Status: AC
  Administered 2014-08-20: 1 mg via INTRAVENOUS
  Filled 2014-08-20: qty 5

## 2014-08-20 MED ORDER — FENTANYL CITRATE (PF) 100 MCG/2ML IJ SOLN
INTRAMUSCULAR | Status: AC
  Filled 2014-08-20: qty 2

## 2014-08-20 NOTE — Progress Notes (Signed)
   Subjective:    Patient ID: [name], female    DOB: 1940/05/14, 74 y.o.   MRN: [id number]  HPI PROCEDURE PERFORMED: Lumbar epidural steroid injection   NOTE: The patient is a 74 y.o. female who returns to [place] for further evaluation and treatment of pain involving the lumbar and lower extremity region.  MRI revealed the patient to be with  Degenerative changes of the lumbar spine with increased size of the L4-5 disc herniation with severe multifactorial spinal stenosis right greater than left lateral recess stenosis at this level. Degenerative disc disease and facet degeneration noted throughout the lumbar spine as well. There is concern regarding intraspinal abnormalities contributing to patient's symptomatology with lumbar stenosis with neurogenic claudication. We will proceed with lumbar epidural steroid injection and will request again that patient undergo neurosurgical evaluation immediately. The risks, benefits, and expectations of the procedure have been discussed and explained to the patient who was understanding and in agreement with suggested treatment plan. We will proceed with lumbar epidural steroid injection as discussed and as explained to the patient who is willing to proceed with procedure as planned.   DESCRIPTION OF PROCEDURE: Lumbar epidural steroid injection with IV Versed, IV fentanyl conscious sedation, EKG, blood pressure, pulse, and pulse oximetry monitoring. The procedure was performed with the patient in the prone position under fluoroscopic guidance. A local anesthetic skin wheal of 1.5% plain lidocaine was accomplished at proposed entry site. An 18-gauge Tuohy epidural needle was inserted at the L  4 vertebral body level  right of the midline via loss-of-resistance technique with negative heme and negative CSF return. A total of 4 mL of Preservative-Free normal saline with 40 mg of Kenalog injected incrementally via epidurally placed needle. Needle was  removed.    A total of 40 mg of Kenalog was utilized for the procedure.   The patient tolerated the injection well.    PLAN:   1. Medications: We will continue presently prescribed medications. 2. Will consider modification of treatment regimen pending response to treatment rendered on today's visit and follow-up evaluation. 3. The patient is to follow-up with primary care physician Dr. Rolena Infante or Dr.Reilly  regarding blood pressure and general medical condition status post lumbar epidural steroid injection performed on today's visit. 4. Surgical evaluation.. We requested neurosurgical evaluation again today and will request the primary care physician schedule neurosurgical evaluation if we are unable to obtain the evaluation. We have expressed  the urgency of the need for the neurosurgical evaluation. 5. Neurological evaluation. 6. The patient may be a candidate for radiofrequency procedures, implantation device, and other treatment pending response to treatment and follow-up evaluation. 7. The patient has been advised to adhere to proper body mechanics and avoid activities which appear to aggravate condition. 8. The patient has been advised to call the Pain Management Center prior to scheduled return appointment should there be significant change in condition or should there be sign  The patient is understanding and agrees with the suggested  treatment plan     Review of Systems     Objective:   Physical Exam        Assessment & Plan:

## 2014-08-20 NOTE — Patient Instructions (Addendum)
Continue present medications and begin taking antibiotic Cipro as prescribed. Please obtain your antibiotic Cipro today and begin taking antibiotic today  F/U PCP Dr. Rolena Infante for  evaliation of  BP and general medical  condition.  F/U surgical evaluation as discussed  F/U neurological evaluation.  May consider radiofrequency rhizolysis or intraspinal procedures pending response to present treatment and F/U evaluation.  Patient to call Pain Management Center should patient have concerns prior to scheduled return appointment.   Pain Management Discharge Instructions  General Discharge Instructions :  If you need to reach your doctor call: Monday-Friday 8:00 am - 4:00 pm at [phone] or toll free [phone].  After clinic hours [phone] to have operator reach doctor.  Bring all of your medication bottles to all your appointments in the pain clinic.  To cancel or reschedule your appointment with Pain Management please remember to call 24 hours in advance to avoid a fee.  Refer to the educational materials which you have been given on: General Risks, I had my Procedure. Discharge Instructions, Post Sedation.  Post Procedure Instructions:  The drugs you were given will stay in your system until tomorrow, so for the next 24 hours you should not drive, make any legal decisions or drink any alcoholic beverages.  You may eat anything you prefer, but it is better to start with liquids then soups and crackers, and gradually work up to solid foods.  Please notify your doctor immediately if you have any unusual bleeding, trouble breathing or pain that is not related to your normal pain.  Depending on the type of procedure that was done, some parts of your body may feel week and/or numb.  This usually clears up by tonight or the next day.  Walk with the use of an assistive device or accompanied by an adult for the 24 hours.  You may use ice on the affected area for the first 24 hours.   Put ice in a Ziploc bag and cover with a towel and place against area 15 minutes on 15 minutes off.  You may switch to heat after 24 hours.

## 2014-08-20 NOTE — Progress Notes (Signed)
Safety precautions to be maintained throughout the outpatient stay will include: orient to surroundings, keep bed in low position, maintain call bell within reach at all times, provide assistance with transfer out of bed and ambulation.  

## 2014-08-21 ENCOUNTER — Telehealth: Payer: Self-pay | Admitting: *Deleted

## 2014-08-21 NOTE — Telephone Encounter (Signed)
Called the number that was given, someone named Gerald Stabs answered on voicemail. Did not leave a message.

## 2014-08-26 ENCOUNTER — Other Ambulatory Visit: Payer: Self-pay | Admitting: Pain Medicine

## 2014-08-30 DIAGNOSIS — F331 Major depressive disorder, recurrent, moderate: Secondary | ICD-10-CM | POA: Insufficient documentation

## 2014-09-23 ENCOUNTER — Ambulatory Visit: Payer: PRIVATE HEALTH INSURANCE | Admitting: Pain Medicine

## 2014-10-13 ENCOUNTER — Ambulatory Visit: Payer: PRIVATE HEALTH INSURANCE | Admitting: Pain Medicine

## 2014-10-18 ENCOUNTER — Emergency Department
Admission: EM | Admit: 2014-10-18 | Discharge: 2014-10-18 | Disposition: A | Discharge status: Home / Self Care | Payer: Medicare (Managed Care) | Attending: Emergency Medicine | Admitting: Emergency Medicine

## 2014-10-18 ENCOUNTER — Encounter: Payer: Self-pay | Admitting: Emergency Medicine

## 2014-10-18 DIAGNOSIS — I10 Essential (primary) hypertension: Secondary | ICD-10-CM | POA: Diagnosis not present

## 2014-10-18 DIAGNOSIS — Z792 Long term (current) use of antibiotics: Secondary | ICD-10-CM | POA: Diagnosis not present

## 2014-10-18 DIAGNOSIS — Z880 Allergy status to penicillin: Secondary | ICD-10-CM | POA: Diagnosis not present

## 2014-10-18 DIAGNOSIS — Z87891 Personal history of nicotine dependence: Secondary | ICD-10-CM | POA: Insufficient documentation

## 2014-10-18 DIAGNOSIS — Z794 Long term (current) use of insulin: Secondary | ICD-10-CM | POA: Insufficient documentation

## 2014-10-18 DIAGNOSIS — Z7982 Long term (current) use of aspirin: Secondary | ICD-10-CM | POA: Insufficient documentation

## 2014-10-18 DIAGNOSIS — Z79899 Other long term (current) drug therapy: Secondary | ICD-10-CM | POA: Diagnosis not present

## 2014-10-18 DIAGNOSIS — M5440 Lumbago with sciatica, unspecified side: Secondary | ICD-10-CM | POA: Diagnosis not present

## 2014-10-18 DIAGNOSIS — M545 Low back pain: Secondary | ICD-10-CM | POA: Diagnosis present

## 2014-10-18 DIAGNOSIS — E119 Type 2 diabetes mellitus without complications: Secondary | ICD-10-CM | POA: Insufficient documentation

## 2014-10-18 HISTORY — DX: Non-Hodgkin lymphoma, unspecified, unspecified site: C85.90

## 2014-10-18 HISTORY — DX: Acute myocardial infarction, unspecified: I21.9

## 2014-10-18 HISTORY — DX: Transient cerebral ischemic attack, unspecified: G45.9

## 2014-10-18 HISTORY — DX: Spinal stenosis, site unspecified: M48.00

## 2014-10-18 MED ORDER — HYDROMORPHONE HCL 1 MG/ML IJ SOLN
1.0000 mg | Freq: Once | INTRAMUSCULAR | Status: AC
Start: 2014-10-18 — End: 2014-10-18
  Administered 2014-10-18: 1 mg via INTRAVENOUS

## 2014-10-18 MED ORDER — OXYCODONE HCL 5 MG PO TABS: 5.0000 mg | ORAL_TABLET | Freq: Three times a day (TID) | ORAL | Status: DC | PRN

## 2014-10-18 MED ORDER — MORPHINE SULFATE (PF) 4 MG/ML IV SOLN
6.0000 mg | Freq: Once | INTRAVENOUS | Status: AC
  Administered 2014-10-18: 6 mg via INTRAVENOUS
  Filled 2014-10-18: qty 2

## 2014-10-18 MED ORDER — HYDROMORPHONE HCL 1 MG/ML IJ SOLN
INTRAMUSCULAR | Status: AC
  Administered 2014-10-18: 1 mg via INTRAVENOUS
  Filled 2014-10-18: qty 1

## 2014-10-18 NOTE — Discharge Instructions (Signed)
Please seek medical attention for any high fevers, chest pain, shortness of breath, change in behavior, persistent vomiting, bloody stool or any other new or concerning symptoms.   Back Pain, Adult Back pain is very common in adults.The cause of back pain is rarely dangerous and the pain often gets better over time.The cause of your back pain may not be known. Some common causes of back pain include:  Strain of the muscles or ligaments supporting the spine.  Wear and tear (degeneration) of the spinal disks.  Arthritis.  Direct injury to the back. For many people, back pain may return. Since back pain is rarely dangerous, most people can learn to manage this condition on their own. HOME CARE INSTRUCTIONS Watch your back pain for any changes. The following actions may help to lessen any discomfort you are feeling:  Remain active. It is stressful on your back to sit or stand in one place for long periods of time. Do not sit, drive, or stand in one place for more than 30 minutes at a time. Take short walks on even surfaces as soon as you are able.Try to increase the length of time you walk each day.  Exercise regularly as directed by your health care provider. Exercise helps your back heal faster. It also helps avoid future injury by keeping your muscles strong and flexible.  Do not stay in bed.Resting more than 1-2 days can delay your recovery.  Pay attention to your body when you bend and lift. The most comfortable positions are those that put less stress on your recovering back. Always use proper lifting techniques, including:  Bending your knees.  Keeping the load close to your body.  Avoiding twisting.  Find a comfortable position to sleep. Use a firm mattress and lie on your side with your knees slightly bent. If you lie on your back, put a pillow under your knees.  Avoid feeling anxious or stressed.Stress increases muscle tension and can worsen back pain.It is important to  recognize when you are anxious or stressed and learn ways to manage it, such as with exercise.  Take medicines only as directed by your health care provider. Over-the-counter medicines to reduce pain and inflammation are often the most helpful.Your health care provider may prescribe muscle relaxant drugs.These medicines help dull your pain so you can more quickly return to your normal activities and healthy exercise.  Apply ice to the injured area:  Put ice in a plastic bag.  Place a towel between your skin and the bag.  Leave the ice on for 20 minutes, 2-3 times a day for the first 2-3 days. After that, ice and heat may be alternated to reduce pain and spasms.  Maintain a healthy weight. Excess weight puts extra stress on your back and makes it difficult to maintain good posture. SEEK MEDICAL CARE IF:  You have pain that is not relieved with rest or medicine.  You have increasing pain going down into the legs or buttocks.  You have pain that does not improve in one week.  You have night pain.  You lose weight.  You have a fever or chills. SEEK IMMEDIATE MEDICAL CARE IF:   You develop new bowel or bladder control problems.  You have unusual weakness or numbness in your arms or legs.  You develop nausea or vomiting.  You develop abdominal pain.  You feel faint.   This information is not intended to replace advice given to you by your health care provider. Make  sure you discuss any questions you have with your health care provider.   Document Released: 12/20/2004 Document Revised: 01/10/2014 Document Reviewed: 04/23/2013 Elsevier Interactive Patient Education [employer].

## 2014-10-18 NOTE — ED Notes (Addendum)
41 yof PMHx DM, HTN, Non-Hodkins Lymphoma, spinal stenosis (s/p steroid inj 08/2014) and MI presents from home via EMS with gradual worsening of low back pain radiating to legs x 1-2 weeks.

## 2014-10-18 NOTE — ED Provider Notes (Signed)
[facility] Emergency Department Provider Note    ____________________________________________  Time seen: 1245  I have reviewed the triage vital signs and the nursing notes.   HISTORY  Chief Complaint Back Pain   History limited by: Not Limited   HPI [name] is a 74 y.o. female who presents to the emergency with worsening back pain. The patient states that she has been dealing with back pain for a number of months now. She has seen a pain specialist. She has had an MRI which does show some spinal stenosis. The patient states that for the past couple of days the pain is gotten worse. She denies any new numbness or tingling in her legs. Denies any new problems with urination or defecation. Denies any fevers. She states that the pain has gotten so bad now that she has not been able to sleep.   Past Medical History  Diagnosis Date  . Diabetes mellitus without complication ([place])   . Arthritis   . Non Hodgkin's lymphoma ([place]) 2009    s/p stem cell transplant  . Hypertension   . MI (myocardial infarction) ([place])     1990s  . TIA (transient ischemic attack)   . Spinal stenosis     Patient Active Problem List   Diagnosis Date Noted  . DDD (degenerative disc disease), lumbar 06/24/2014  . Facet syndrome, lumbar 06/24/2014  . Spinal stenosis, lumbar region, with neurogenic claudication 06/24/2014  . Neuropathy due to secondary diabetes ([place]) 06/24/2014    Past Surgical History  Procedure Laterality Date  . Abdominal hysterectomy    . Toe amputation Left   . Eye surgery Bilateral     Current Outpatient Rx  Name  Route  Sig  Dispense  Refill  . acetaminophen (TYLENOL) 500 MG tablet   Oral   Take 500 mg by mouth every 8 (eight) hours as needed.         Marland Kitchen amLODipine (NORVASC) 5 MG tablet   Oral   Take 5 mg by mouth daily.         Marland Kitchen aspirin EC 81 MG tablet   Oral   Take 81 mg by mouth daily.         Marland Kitchen atorvastatin (LIPITOR) 80 MG  tablet   Oral   Take 80 mg by mouth daily.         . brimonidine-timolol (COMBIGAN) 0.2-0.5 % ophthalmic solution   Both Eyes   Place 1 drop into both eyes every 12 (twelve) hours.         . Carboxymethylcellul-Glycerin (OPTIVE) 0.5-0.9 % SOLN   Ophthalmic   Apply 1-2 drops to eye 3 (three) times daily as needed.         . chlorthalidone (HYGROTON) 25 MG tablet   Oral   Take 25 mg by mouth daily.         . ciprofloxacin (CIPRO) 250 MG tablet   Oral   Take 1 tablet (250 mg total) by mouth 2 (two) times daily. Patient not taking: Reported on 08/12/2014   14 tablet   0   . ciprofloxacin (CIPRO) 250 MG tablet   Oral   Take 1 tablet (250 mg total) by mouth 2 (two) times daily.   14 tablet   0   . citalopram (CELEXA) 20 MG tablet   Oral   Take 20 mg by mouth daily.         Marland Kitchen docusate sodium (COLACE) 100 MG capsule   Oral  Take 100 mg by mouth at bedtime as needed for mild constipation.         . gabapentin (NEURONTIN) 300 MG capsule   Oral   Take 300 mg by mouth.         . Insulin Glargine (LANTUS SOLOSTAR) 100 UNIT/ML Solostar Pen   Subcutaneous   Inject 60 Units into the skin daily.         Marland Kitchen lisinopril (PRINIVIL,ZESTRIL) 40 MG tablet   Oral   Take 40 mg by mouth daily.         . naproxen (NAPROSYN) 500 MG tablet   Oral   Take 500 mg by mouth every morning.         Marland Kitchen omeprazole (PRILOSEC) 20 MG capsule   Oral   Take 20 mg by mouth daily.         Marland Kitchen oxyCODONE (OXY IR/ROXICODONE) 5 MG immediate release tablet   Oral   Take 5 mg by mouth every 4 (four) hours as needed for severe pain.         Marland Kitchen oxyCODONE (OXY IR/ROXICODONE) 5 MG immediate release tablet   Oral   Take 2.5 mg by mouth every 8 (eight) hours as needed for severe pain.         . polyethylene glycol (MIRALAX / GLYCOLAX) packet   Oral   Take 17 g by mouth daily as needed.         . Travoprost, BAK Free, (TRAVATAN) 0.004 % SOLN ophthalmic solution   Both Eyes   Place 1  drop into both eyes.         Marland Kitchen trolamine salicylate (ASPERCREME) 10 % cream   Topical   Apply 1 application topically as needed for muscle pain (left hip).         . Vitamin D, Ergocalciferol, (DRISDOL) 50000 UNITS CAPS capsule   Oral   Take 50,000 Units by mouth every 30 (thirty) days.         . Zinc Oxide (DESITIN) 13 % CREA   Apply externally   Apply topically.           Allergies Penicillins  Family History  Problem Relation Age of Onset  . Hypertension Mother   . Heart disease Mother   . Diabetes Father     Social History Social History  Substance Use Topics  . Smoking status: Former Research scientist (life sciences)  . Smokeless tobacco: None  . Alcohol Use: No    Review of Systems  Constitutional: Negative for fever. Cardiovascular: Negative for chest pain. Respiratory: Negative for shortness of breath. Gastrointestinal: Negative for abdominal pain, vomiting and diarrhea. Genitourinary: Negative for dysuria. Musculoskeletal: Positive for back pain Skin: Negative for rash. Neurological: Negative for headaches, focal weakness or numbness.  10-point ROS otherwise negative.  ____________________________________________   PHYSICAL EXAM:  VITAL SIGNS: ED Triage Vitals  Enc Vitals Group     BP 10/18/14 1227 127/61 mmHg     Pulse Rate 10/18/14 1227 84     Resp 10/18/14 1227 20     Temp 10/18/14 1227 97.8 F (36.6 C)     Temp Source 10/18/14 1227 Oral     SpO2 10/18/14 1227 100 %     Weight 10/18/14 1227 220 lb (99.791 kg)     Height 10/18/14 1227 5\' 5"  (1.651 m)   Constitutional: Alert and oriented. Appears uncomfortable Eyes: Conjunctivae are normal. PERRL. Normal extraocular movements. ENT   Head: Normocephalic and atraumatic.   Nose: No congestion/rhinnorhea.  Mouth/Throat: Mucous membranes are moist.   Neck: No stridor. Hematological/Lymphatic/Immunilogical: No cervical lymphadenopathy. Cardiovascular: Normal rate, regular rhythm.  No murmurs,  rubs, or gallops. Respiratory: Normal respiratory effort without tachypnea nor retractions. Breath sounds are clear and equal bilaterally. No wheezes/rales/rhonchi. Gastrointestinal: Soft and nontender. No distention.  Genitourinary: Deferred Musculoskeletal: Normal range of motion in all extremities. No joint effusions.  No lower extremity tenderness nor edema. Neurologic:  Normal speech and language. No gross focal neurologic deficits are appreciated. Speech is normal.  Skin:  Skin is warm, dry and intact. No rash noted. Psychiatric: Mood and affect are normal. Speech and behavior are normal. Patient exhibits appropriate insight and judgment.  ____________________________________________    LABS (pertinent positives/negatives)  None  ____________________________________________   EKG  None  ____________________________________________    RADIOLOGY  None   ____________________________________________   PROCEDURES  Procedure(s) performed: None  Critical Care performed: No  ____________________________________________   INITIAL IMPRESSION / ASSESSMENT AND PLAN / ED COURSE  Pertinent labs & imaging results that were available during my care of the patient were reviewed by me and considered in my medical decision making (see chart for details).  Patient presents to the emergency department today with acute on chronic back pain. Currently patient was found any symptoms concerning for cord compression. Patient did feel better and was able to nap after IV pain medication. Currently not on any narcotic pain medication at home. Will give patient prescription for narcotic pain medication instructed patient follow-up with primary care doctor.  ____________________________________________   FINAL CLINICAL IMPRESSION(S) / ED DIAGNOSES  Final diagnoses:  Low back pain, unspecified back pain laterality, with sciatica presence unspecified     Nance Pear, MD 10/18/14  [phone]

## 2014-10-29 ENCOUNTER — Encounter: Payer: Self-pay | Admitting: Pain Medicine

## 2014-10-29 ENCOUNTER — Emergency Department
Admission: EM | Admit: 2014-10-29 | Discharge: 2014-10-29 | Disposition: A | Discharge status: Home / Self Care | Payer: Medicare (Managed Care) | Source: Home / Self Care | Attending: Emergency Medicine | Admitting: Emergency Medicine

## 2014-10-29 ENCOUNTER — Encounter: Payer: Self-pay | Admitting: Emergency Medicine

## 2014-10-29 ENCOUNTER — Emergency Department: Payer: Medicare (Managed Care)

## 2014-10-29 ENCOUNTER — Ambulatory Visit: Payer: PRIVATE HEALTH INSURANCE | Admitting: Pain Medicine

## 2014-10-29 VITALS — BP 136/58 | HR 79 | Temp 98.0°F | Resp 18 | Ht 66.0 in | Wt 228.0 lb

## 2014-10-29 DIAGNOSIS — Z79899 Other long term (current) drug therapy: Secondary | ICD-10-CM

## 2014-10-29 DIAGNOSIS — Z794 Long term (current) use of insulin: Secondary | ICD-10-CM

## 2014-10-29 DIAGNOSIS — M48062 Spinal stenosis, lumbar region with neurogenic claudication: Secondary | ICD-10-CM

## 2014-10-29 DIAGNOSIS — I10 Essential (primary) hypertension: Secondary | ICD-10-CM

## 2014-10-29 DIAGNOSIS — T404X1A Poisoning by other synthetic narcotics, accidental (unintentional), initial encounter: Secondary | ICD-10-CM

## 2014-10-29 DIAGNOSIS — Y9389 Activity, other specified: Secondary | ICD-10-CM | POA: Insufficient documentation

## 2014-10-29 DIAGNOSIS — Z792 Long term (current) use of antibiotics: Secondary | ICD-10-CM | POA: Insufficient documentation

## 2014-10-29 DIAGNOSIS — Y998 Other external cause status: Secondary | ICD-10-CM

## 2014-10-29 DIAGNOSIS — E119 Type 2 diabetes mellitus without complications: Secondary | ICD-10-CM | POA: Insufficient documentation

## 2014-10-29 DIAGNOSIS — Y9289 Other specified places as the place of occurrence of the external cause: Secondary | ICD-10-CM | POA: Insufficient documentation

## 2014-10-29 DIAGNOSIS — T50901A Poisoning by unspecified drugs, medicaments and biological substances, accidental (unintentional), initial encounter: Secondary | ICD-10-CM

## 2014-10-29 DIAGNOSIS — Z791 Long term (current) use of non-steroidal anti-inflammatories (NSAID): Secondary | ICD-10-CM

## 2014-10-29 DIAGNOSIS — M5136 Other intervertebral disc degeneration, lumbar region: Secondary | ICD-10-CM

## 2014-10-29 DIAGNOSIS — Z7982 Long term (current) use of aspirin: Secondary | ICD-10-CM | POA: Insufficient documentation

## 2014-10-29 DIAGNOSIS — I639 Cerebral infarction, unspecified: Secondary | ICD-10-CM | POA: Diagnosis not present

## 2014-10-29 DIAGNOSIS — Z880 Allergy status to penicillin: Secondary | ICD-10-CM

## 2014-10-29 DIAGNOSIS — E1340 Other specified diabetes mellitus with diabetic neuropathy, unspecified: Secondary | ICD-10-CM

## 2014-10-29 DIAGNOSIS — M47816 Spondylosis without myelopathy or radiculopathy, lumbar region: Secondary | ICD-10-CM

## 2014-10-29 DIAGNOSIS — Z87891 Personal history of nicotine dependence: Secondary | ICD-10-CM | POA: Insufficient documentation

## 2014-10-29 DIAGNOSIS — R4182 Altered mental status, unspecified: Secondary | ICD-10-CM | POA: Diagnosis not present

## 2014-10-29 LAB — GLUCOSE, CAPILLARY: GLUCOSE-CAPILLARY: 140 mg/dL — AB (ref 65–99)

## 2014-10-29 MED ORDER — NALOXONE HCL 2 MG/2ML IJ SOSY
0.4000 mg | PREFILLED_SYRINGE | Freq: Once | INTRAMUSCULAR | Status: AC
  Administered 2014-10-29: 0.4 mg via INTRAVENOUS
  Filled 2014-10-29: qty 2

## 2014-10-29 NOTE — ED Notes (Signed)
Pt arrived by POV with left sided weakness

## 2014-10-29 NOTE — ED Notes (Signed)
MD, stroke coordinator, and charge nurse at bedside.

## 2014-10-29 NOTE — ED Notes (Signed)
Pt taken to CT by this RN.  ?

## 2014-10-29 NOTE — ED Notes (Signed)
[place] computer set up at bedside and MD reminded to order [facility] consult.

## 2014-10-29 NOTE — ED Notes (Signed)
Pt to [facility] with RN from pain clinic.  RN reports pt went unresponsive for a short period and when she starting waking up was weak on her left side.  Pt now mumbles and can't answer questions.  Code Stroke called.

## 2014-10-29 NOTE — Progress Notes (Signed)
   Subjective:    Patient ID: [name], female    DOB: 1940-01-08, 74 y.o.   MRN: [id number]  HPI patient is 74 year old female who returns to [facility] for further evaluation and treatment of pain involving the lumbar and lower extremity region. On today's visit patient was noted to be altered mental status. After evaluation of patient including evaluation of patient's glucose level which was within normal limits patient was transported to the emergency room for further evaluation and treatment. He was accompanied by patient's daughter on today's visit who stated that her mother had been with some slight changes in her mental status over the past few day and maybe weeks. We questioned patient and following evaluation patient was transported to the emergency room for further evaluations   Review of Systems     Objective:   Physical Exam    The patient was noted to be with a stair without definite eye contact. The patient was able to answer to date place correctly the patient is with history of a fall several days ago without medical evaluation. No masses of the head were noted on evaluation There was tends to palpation of paraspinal muscles and cervical region cervical facet region of mild degree with mild tenderness of the splenius capitis and occipitalis musculature regions. Patient appeared to be with decreased grip strength on the right. Tinel and Phalen's maneuver were without increased pain of significant degree.. There was tends to palpation of paraspinal muscles and cervical region thoracic region. No crepitus of the thoracic region was noted. Palpation over the lumbar paraspinal muscle lumbar facet region was a tends to palpation of moderate degree. There was decreased EHL strength on the right compared to the left. No sensory deficit of dermatomal distribution was detected. There was negative clonus and negative Homans Abdomen was nontender and no costovertebral tenderness  was noted       Assessment & Plan:    Altered mental status (concern regarding TIA,  hematoma, infection, and other abnormalities)   Degenerative disc disease lumbar spine   Spinal stenosis with neurogenic claudication   Diabetes mellitus with diabetic neuropathy   Sacroiliac joint dysfunction    PLAN   Continue present medication  F/U PCP Dr. Rolena Infante and Dr.Reilly for Further evaluation following evaluation of patient and emergency department with concern regarding TIA, hematoma, infection, and other abnormalities which may be contributing to change in patient's mental status   F/U surgical evaluation may be considered pending further assessment of patient's condition  F/U neurological evaluation. The patient is to undergo neurological evaluation for further assessment of change in mental status  May consider radiofrequency rhizolysis or intraspinal procedures pending response to present treatment and F/U evaluation . At the present time we'll avoid interventional treatment and will await further evaluation of patient's general medical condition  Patient to call [facility] should patient have concerns prior to scheduled return appointmen.

## 2014-10-29 NOTE — ED Notes (Signed)
Stroke response nurse summary - Pt brought to ED by daughter after having a syncopal episode at the pain clinic. Pt has generalized weakness, obtunded LOC, pin point pulpils equal, reactive to light accomodation.  Pt's, LOC/orientation and speech are the only deficits noted. Pt's daughter states that the LKW was 1245. Code stroke was initiated in triage, pt taken straight to CT, then to room 24 where primary nurse Shannin RN resumed care. CT negative for blood. Vascular access difficult to obtain, multiple sticks by multiple nurses, access obtained on rt side via US guided IV by Noe Gens. RN.  0.4mg  Narcan was administered and pt was immediately more alert with no deficits noted. EDP called of code stroke at this time. Initial NIH was 4 on exam.

## 2014-10-29 NOTE — Discharge Instructions (Signed)
Accidental Overdose Do not take tramadol and oxycodone together. Stay with her tonight. We also notice that there is a chance she may have a tiny aneurysm in her brain, 4 mm, smaller than a pea.  This did not cause any of her symptoms today may never cause her any harm, is also quite likely that it does not really exist as sometimes on CT scan we see things that are not later found on MRI . However, we do need to make you aware of this. Please follow closely with her doctor and/or neurosurgery at [facility] for further evaluation. A drug overdose occurs when a chemical substance (drug or medication) is used in amounts large enough to overcome a person. This may result in severe illness or death. This is a type of poisoning. Accidental overdoses of medications or other substances come from a variety of reasons. When this happens accidentally, it is often because the person taking the substance does not know enough about what they have taken. Drugs which commonly cause overdose deaths are alcohol, psychotropic medications (medications which affect the mind), pain medications, illegal drugs (street drugs) such as cocaine and heroin, and multiple drugs taken at the same time. It may result from careless behavior (such as over-indulging at a party). Other causes of overdose may include multiple drug use, a lapse in memory, or drug use after a period of no drug use.  Sometimes overdosing occurs because a person cannot remember if they have taken their medication.  A common unintentional overdose in young children involves multi-vitamins containing iron. Iron is a part of the hemoglobin molecule in blood. It is used to transport oxygen to living cells. When taken in small amounts, iron allows the body to restock hemoglobin. In large amounts, it causes problems in the body. If this overdose is not treated, it can lead to death. Never take medicines that show signs of tampering or do not seem quite right. Never take medicines  in the dark or in poor lighting. Read the label and check each dose of medicine before you take it. When adults are poisoned, it happens most often through carelessness or lack of information. Taking medicines in the dark or taking medicine prescribed for someone else to treat the same type of problem is a dangerous practice. SYMPTOMS  Symptoms of overdose depend on the medication and amount taken. They can vary from over-activity with stimulant over-dosage, to sleepiness from depressants such as alcohol, narcotics and tranquilizers. Confusion, dizziness, nausea and vomiting may be present. If problems are severe enough coma and death may result. DIAGNOSIS  Diagnosis and management are generally straightforward if the drug is known. Otherwise it is more difficult. At times, certain symptoms and signs exhibited by the patient, or blood tests, can reveal the drug in question.  TREATMENT  In an emergency department, most patients can be treated with supportive measures. Antidotes may be available if there has been an overdose of opioids or benzodiazepines. A rapid improvement will often occur if this is the cause of overdose. At home or away from medical care:  There may be no immediate problems or warning signs in children.  Not everything works well in all cases of poisoning.  Take immediate action. Poisons may act quickly.  If you think someone has swallowed medicine or a household product, and the person is unconscious, having seizures (convulsions), or is not breathing, immediately call for an ambulance. IF a person is conscious and appears to be doing OK but has swallowed  a poison:  Do not wait to see what effect the poison will have. Immediately call a poison control center (listed in the white pages of your telephone book under "Poison Control" or inside the front cover with other emergency numbers). Some poison control centers have TTY capability for the deaf. Check with your local center if  you or someone in your family requires this service.  Keep the container so you can read the label on the product for ingredients.  Describe what, when, and how much was taken and the age and condition of the person poisoned. Inform them if the person is vomiting, choking, drowsy, shows a change in color or temperature of skin, is conscious or unconscious, or is convulsing.  Do not cause vomiting unless instructed by medical personnel. Do not induce vomiting or force liquids into a person who is convulsing, unconscious, or very drowsy. Stay calm and in control.   Activated charcoal also is sometimes used in certain types of poisoning and you may wish to add a supply to your emergency medicines. It is available without a prescription. Call a poison control center before using this medication. PREVENTION  Thousands of children die every year from unintentional poisoning. This may be from household chemicals, poisoning from carbon monoxide in a car, taking their parent's medications, or simply taking a few iron pills or vitamins with iron. Poisoning comes from unexpected sources.  Store medicines out of the sight and reach of children, preferably in a locked cabinet. Do not keep medications in a food cabinet. Always store your medicines in a secure place. Get rid of expired medications.  If you have children living with you or have them as occasional guests, you should have child-resistant caps on your medicine containers. Keep everything out of reach. Child proof your home.  If you are called to the telephone or to answer the door while you are taking a medicine, take the container with you or put the medicine out of the reach of small children.  Do not take your medication in front of children. Do not tell your child how good a medication is and how good it is for them. They may get the idea it is more of a treat.  If you are an adult and have accidentally taken an overdose, you need to consider  how this happened and what can be done to prevent it from happening again. If this was from a street drug or alcohol, determine if there is a problem that needs addressing. If you are not sure a problems exists, it is easy to talk to a professional and ask them if they think you have a problem. It is better to handle this problem in this way before it happens again and has a much worse consequence.   This information is not intended to replace advice given to you by your health care provider. Make sure you discuss any questions you have with your health care provider.   Document Released: 03/05/2004 Document Revised: 01/10/2014 Document Reviewed: 06/09/2014 Elsevier Interactive Patient Education [employer].

## 2014-10-29 NOTE — Progress Notes (Signed)
When brought back to room pt very disoriented-confused-- bs taken 185--weakness noted to right side (arm unable to lift, right leg fallen from wheelchair- weak, have to asst to put back ) Went to get daughter from waiting room -jpt was  not her normal self-speech slurred- unable to answer appropriate questions- will take to ER for eval.  Pts daughter states that she is not acting "right"-- report given to triage nurse.

## 2014-10-29 NOTE — ED Provider Notes (Signed)
-----------------------------------------   6:17 PM on 10/29/2014 -----------------------------------------  Sign out was to ensure the patient did not have any further overdose symptoms after Narcan wears off.  Patient is resting completely this time and is awake and alert and without any signs of sedation or overdose. Denies any pain including headache. Is saying that she would like to go home at this time. Physical Exam  BP 114/103 mmHg  Resp 17  Physical Exam Patient awake and alert without any focal neurologic deficits. ED Course  Procedures  MDM Patient aware as well as family member who is at the bedside of the possible aneurysmal finding on the CT scan. There going to follow-up with the neurosurgeon as directed by Dr.McShane.        Orbie Pyo, MD 10/29/14 [phone]

## 2014-10-29 NOTE — Patient Instructions (Addendum)
Continue present medication  F/U PCP Dr. Rolena Infante or Dr.Reilly   for evaliation of  BP, diabetes mellitus, and general medical condition as discussed. after the emergency room visit today  F/U surgical evaluation. Neurosurgical evaluation of lower back and lower extremity pain as discussed  F/U neurological evaluation  May consider radiofrequency rhizolysis or intraspinal procedures pending response to present treatment and F/U evaluation   Patient to call [facility] should patient have concerns prior to scheduled appointment

## 2014-10-29 NOTE — ED Provider Notes (Addendum)
[facility] Emergency Department Provider Note  ____________________________________________   I have reviewed the triage vital signs and the nursing notes.   HISTORY  Chief Complaint Code Stroke    HPI [name] is a 74 y.o. female was at the pain clinic. Patient was recently given a projection for tramadol in addition to her baseline oxycodone. She's not sure if she takes the oxycodone this morning but she did take her tramadol. The patient has chronic sciatica. She was at the pain clinic to get a shot apparently and she became unresponsive. She was not pulseless. Upon arrival, patient has pinpoint pupils is nonfocal neurologic exam but quite sleepy. Her daughter is with her and helps provide a history.  Past Medical History  Diagnosis Date  . Diabetes mellitus without complication ([place])   . Arthritis   . Non Hodgkin's lymphoma ([place]) 2009    s/p stem cell transplant  . Hypertension   . MI (myocardial infarction) ([place])     1990s  . TIA (transient ischemic attack)   . Spinal stenosis     Patient Active Problem List   Diagnosis Date Noted  . DDD (degenerative disc disease), lumbar 06/24/2014  . Facet syndrome, lumbar 06/24/2014  . Spinal stenosis, lumbar region, with neurogenic claudication 06/24/2014  . Neuropathy due to secondary diabetes ([place]) 06/24/2014    Past Surgical History  Procedure Laterality Date  . Abdominal hysterectomy    . Toe amputation Left   . Eye surgery Bilateral     Current Outpatient Rx  Name  Route  Sig  Dispense  Refill  . acetaminophen (TYLENOL) 500 MG tablet   Oral   Take 500 mg by mouth every 8 (eight) hours as needed.         Marland Kitchen amLODipine (NORVASC) 5 MG tablet   Oral   Take 5 mg by mouth daily.         Marland Kitchen aspirin EC 81 MG tablet   Oral   Take 81 mg by mouth daily.         Marland Kitchen atorvastatin (LIPITOR) 80 MG tablet   Oral   Take 80 mg by mouth daily.         . brimonidine-timolol (COMBIGAN) 0.2-0.5  % ophthalmic solution   Both Eyes   Place 1 drop into both eyes every 12 (twelve) hours.         . Carboxymethylcellul-Glycerin (OPTIVE) 0.5-0.9 % SOLN   Ophthalmic   Apply 1-2 drops to eye 3 (three) times daily as needed.         . chlorthalidone (HYGROTON) 25 MG tablet   Oral   Take 25 mg by mouth daily.         . ciprofloxacin (CIPRO) 250 MG tablet   Oral   Take 1 tablet (250 mg total) by mouth 2 (two) times daily. Patient not taking: Reported on 08/12/2014   14 tablet   0   . ciprofloxacin (CIPRO) 250 MG tablet   Oral   Take 1 tablet (250 mg total) by mouth 2 (two) times daily.   14 tablet   0   . citalopram (CELEXA) 20 MG tablet   Oral   Take 20 mg by mouth daily.         Marland Kitchen docusate sodium (COLACE) 100 MG capsule   Oral   Take 100 mg by mouth at bedtime as needed for mild constipation.         . gabapentin (NEURONTIN) 300 MG capsule  Oral   Take 300 mg by mouth.         . Insulin Glargine (LANTUS SOLOSTAR) 100 UNIT/ML Solostar Pen   Subcutaneous   Inject 60 Units into the skin daily.         Marland Kitchen lisinopril (PRINIVIL,ZESTRIL) 40 MG tablet   Oral   Take 40 mg by mouth daily.         . naproxen (NAPROSYN) 500 MG tablet   Oral   Take 500 mg by mouth every morning.         Marland Kitchen omeprazole (PRILOSEC) 20 MG capsule   Oral   Take 20 mg by mouth daily.         Marland Kitchen oxyCODONE (ROXICODONE) 5 MG immediate release tablet   Oral   Take 1 tablet (5 mg total) by mouth every 8 (eight) hours as needed.   20 tablet   0   . polyethylene glycol (MIRALAX / GLYCOLAX) packet   Oral   Take 17 g by mouth daily as needed.         . Travoprost, BAK Free, (TRAVATAN) 0.004 % SOLN ophthalmic solution   Both Eyes   Place 1 drop into both eyes.         Marland Kitchen trolamine salicylate (ASPERCREME) 10 % cream   Topical   Apply 1 application topically as needed for muscle pain (left hip).         . Vitamin D, Ergocalciferol, (DRISDOL) 50000 UNITS CAPS capsule   Oral    Take 50,000 Units by mouth every 30 (thirty) days.         . Zinc Oxide (DESITIN) 13 % CREA   Apply externally   Apply topically.           Allergies Penicillins  Family History  Problem Relation Age of Onset  . Hypertension Mother   . Heart disease Mother   . Diabetes Father     Social History Social History  Substance Use Topics  . Smoking status: Former Research scientist (life sciences)  . Smokeless tobacco: Not on file  . Alcohol Use: No    Review of Systems Constitutional: No fever/chills Eyes: No visual changes. ENT: No sore throat. No stiff neck no neck pain Cardiovascular: Denies chest pain. Respiratory: Denies shortness of breath. Gastrointestinal:   no vomiting.  No diarrhea.  No constipation. Genitourinary: Negative for dysuria. Musculoskeletal: Negative lower extremity swelling Skin: Negative for rash. Neurological: Negative for headaches, focal weakness or numbness. 10-point ROS otherwise negative.  ____________________________________________   PHYSICAL EXAM:  VITAL SIGNS: ED Triage Vitals  Enc Vitals Group     BP --      Pulse --      Resp --      Temp --      Temp src --      SpO2 --      Weight --      Height --      Head Cir --      Peak Flow --      Pain Score --      Pain Loc --      Pain Edu? --      Excl. in [place]? --     Constitutional: Alert and oriented. Well appearing and in no acute distress. Eyes: Conjunctivae are normal. PERRL. EOMI. Head: Atraumatic. Nose: No congestion/rhinnorhea. Mouth/Throat: Mucous membranes are moist.  Oropharynx non-erythematous. Neck: No stridor.   Nontender with no meningismus Cardiovascular: Normal rate, regular rhythm. Grossly normal heart sounds.  Good peripheral circulation. Respiratory: Normal respiratory effort.  No retractions. Lungs CTAB. Gastrointestinal: Soft and nontender. No distention. No guarding no rebound Back:  There is no focal tenderness or step off there is no midline tenderness there are no  lesions noted. there is no CVA tenderness Musculoskeletal: No lower extremity tenderness. No joint effusions, no DVT signs strong distal pulses no edema Neurologic:  Normal speech and language. No gross focal neurologic deficits are appreciated.  Skin:  Skin is warm, dry and intact. No rash noted. Psychiatric: Mood and affect are normal. Speech and behavior are normal.  ____________________________________________   LABS (all labs ordered are listed, but only abnormal results are displayed)  Labs Reviewed  GLUCOSE, CAPILLARY - Abnormal; Notable for the following:    Glucose-Capillary 140 (*)    All other components within normal limits  ETHANOL  PROTIME-INR  APTT  CBC  DIFFERENTIAL  COMPREHENSIVE METABOLIC PANEL  TROPONIN I  URINALYSIS COMPLETEWITH MICROSCOPIC ([facility] ONLY)   ____________________________________________  EKG  EKG shows normal sinus rhythm rate 72 bpm normal axis, nonspecific ST changes I personally interpreted EKG  ____________________________________________  RADIOLOGY   ____________________________________________   PROCEDURES  Procedure(s) performed: None  Critical Care performed: None  ____________________________________________   INITIAL IMPRESSION / ASSESSMENT AND PLAN / ED COURSE  Pertinent labs & imaging results that were available during my care of the patient were reviewed by me and considered in my medical decision making (see chart for details).  Patient with pinpoint pupils, blood pressure sometimes low when she fell asleep and then would come up when aroused her. She was always easily arousable. I did give her Narcan and all of her symptoms resolved. This time she is awake and alert and completely at her baseline. S2 suspect the patient's been taking too many narcotic-containing pain medications at home for her chronic pains. She remains nonfocal. She was initially called as a code stroke but obviously that is not something that we are  going to pursue, patient does not need TPA. This time her NIH stroke scale of 0. All her somnolence and all of her symptoms including her pinpoint pupils went away with 0.4 Narcan. We'll watch her in the emergency room. Incidental note is made of a possible aneurysm in her brain. That is certainly not causing her symptoms today. I have made family aware, and they will follow-up as an outpatient. ____________________________________________  ----------------------------------------- 3:13 PM on 10/29/2014 -----------------------------------------  Page remains awake and alert and at her baseline. She refuses further IV sticks for blood and I do not think that is unreasonable. We will watch her closely here.   ----------------------------------------- 3:47 PM on 10/29/2014 -----------------------------------------   Remains awake and alert, we will sign her out at the end of my shift to Dr. Brandy Hale, we'll continue to observe her. She has been made aware of the findings of a question of a small 4 mm aneurysm which again is not causing her symptoms and the need for follow-up has been stressed and understood. FINAL CLINICAL IMPRESSION(S) / ED DIAGNOSES  Final diagnoses:  None     Schuyler Amor, MD 10/29/14 [place], MD 10/29/14 [place], MD 10/29/14 [place], MD 10/29/14 [phone]

## 2014-10-31 ENCOUNTER — Other Ambulatory Visit: Payer: Self-pay

## 2014-10-31 ENCOUNTER — Inpatient Hospital Stay
Admission: EM | Admit: 2014-10-31 | Discharge: 2014-11-04 | DRG: 064 | Disposition: A | Discharge status: Home / Self Care | Payer: Medicare (Managed Care) | Attending: Internal Medicine | Admitting: Internal Medicine

## 2014-10-31 ENCOUNTER — Encounter: Payer: Self-pay | Admitting: Emergency Medicine

## 2014-10-31 ENCOUNTER — Emergency Department: Payer: Medicare (Managed Care)

## 2014-10-31 DIAGNOSIS — Z89422 Acquired absence of other left toe(s): Secondary | ICD-10-CM

## 2014-10-31 DIAGNOSIS — Z791 Long term (current) use of non-steroidal anti-inflammatories (NSAID): Secondary | ICD-10-CM

## 2014-10-31 DIAGNOSIS — K219 Gastro-esophageal reflux disease without esophagitis: Secondary | ICD-10-CM | POA: Diagnosis present

## 2014-10-31 DIAGNOSIS — E1122 Type 2 diabetes mellitus with diabetic chronic kidney disease: Secondary | ICD-10-CM | POA: Diagnosis present

## 2014-10-31 DIAGNOSIS — Z880 Allergy status to penicillin: Secondary | ICD-10-CM

## 2014-10-31 DIAGNOSIS — Z8572 Personal history of non-Hodgkin lymphomas: Secondary | ICD-10-CM

## 2014-10-31 DIAGNOSIS — N170 Acute kidney failure with tubular necrosis: Secondary | ICD-10-CM | POA: Diagnosis present

## 2014-10-31 DIAGNOSIS — G939 Disorder of brain, unspecified: Secondary | ICD-10-CM | POA: Insufficient documentation

## 2014-10-31 DIAGNOSIS — R748 Abnormal levels of other serum enzymes: Secondary | ICD-10-CM | POA: Diagnosis present

## 2014-10-31 DIAGNOSIS — Z833 Family history of diabetes mellitus: Secondary | ICD-10-CM

## 2014-10-31 DIAGNOSIS — R1012 Left upper quadrant pain: Secondary | ICD-10-CM | POA: Diagnosis present

## 2014-10-31 DIAGNOSIS — I10 Essential (primary) hypertension: Secondary | ICD-10-CM | POA: Diagnosis present

## 2014-10-31 DIAGNOSIS — Z9484 Stem cells transplant status: Secondary | ICD-10-CM

## 2014-10-31 DIAGNOSIS — Z7982 Long term (current) use of aspirin: Secondary | ICD-10-CM

## 2014-10-31 DIAGNOSIS — Z8673 Personal history of transient ischemic attack (TIA), and cerebral infarction without residual deficits: Secondary | ICD-10-CM

## 2014-10-31 DIAGNOSIS — J690 Pneumonitis due to inhalation of food and vomit: Secondary | ICD-10-CM | POA: Insufficient documentation

## 2014-10-31 DIAGNOSIS — Z803 Family history of malignant neoplasm of breast: Secondary | ICD-10-CM

## 2014-10-31 DIAGNOSIS — G9340 Encephalopathy, unspecified: Secondary | ICD-10-CM | POA: Diagnosis present

## 2014-10-31 DIAGNOSIS — M4800 Spinal stenosis, site unspecified: Secondary | ICD-10-CM | POA: Diagnosis present

## 2014-10-31 DIAGNOSIS — Z79899 Other long term (current) drug therapy: Secondary | ICD-10-CM

## 2014-10-31 DIAGNOSIS — E119 Type 2 diabetes mellitus without complications: Secondary | ICD-10-CM

## 2014-10-31 DIAGNOSIS — R4182 Altered mental status, unspecified: Secondary | ICD-10-CM

## 2014-10-31 DIAGNOSIS — E162 Hypoglycemia, unspecified: Secondary | ICD-10-CM | POA: Diagnosis present

## 2014-10-31 DIAGNOSIS — E11649 Type 2 diabetes mellitus with hypoglycemia without coma: Secondary | ICD-10-CM | POA: Diagnosis present

## 2014-10-31 DIAGNOSIS — M25559 Pain in unspecified hip: Secondary | ICD-10-CM | POA: Diagnosis present

## 2014-10-31 DIAGNOSIS — Z794 Long term (current) use of insulin: Secondary | ICD-10-CM

## 2014-10-31 DIAGNOSIS — Z87891 Personal history of nicotine dependence: Secondary | ICD-10-CM

## 2014-10-31 DIAGNOSIS — R29810 Facial weakness: Secondary | ICD-10-CM

## 2014-10-31 DIAGNOSIS — I252 Old myocardial infarction: Secondary | ICD-10-CM

## 2014-10-31 DIAGNOSIS — R000 Tachycardia, unspecified: Secondary | ICD-10-CM | POA: Diagnosis present

## 2014-10-31 DIAGNOSIS — I639 Cerebral infarction, unspecified: Principal | ICD-10-CM | POA: Diagnosis present

## 2014-10-31 DIAGNOSIS — N183 Chronic kidney disease, stage 3 (moderate): Secondary | ICD-10-CM | POA: Diagnosis present

## 2014-10-31 DIAGNOSIS — I2510 Atherosclerotic heart disease of native coronary artery without angina pectoris: Secondary | ICD-10-CM | POA: Diagnosis present

## 2014-10-31 DIAGNOSIS — E785 Hyperlipidemia, unspecified: Secondary | ICD-10-CM | POA: Diagnosis present

## 2014-10-31 DIAGNOSIS — I129 Hypertensive chronic kidney disease with stage 1 through stage 4 chronic kidney disease, or unspecified chronic kidney disease: Secondary | ICD-10-CM | POA: Diagnosis present

## 2014-10-31 DIAGNOSIS — M1990 Unspecified osteoarthritis, unspecified site: Secondary | ICD-10-CM | POA: Diagnosis present

## 2014-10-31 DIAGNOSIS — Z8249 Family history of ischemic heart disease and other diseases of the circulatory system: Secondary | ICD-10-CM

## 2014-10-31 DIAGNOSIS — N179 Acute kidney failure, unspecified: Secondary | ICD-10-CM | POA: Diagnosis present

## 2014-10-31 DIAGNOSIS — N189 Chronic kidney disease, unspecified: Secondary | ICD-10-CM

## 2014-10-31 HISTORY — DX: Hyperlipidemia, unspecified: E78.5

## 2014-10-31 HISTORY — DX: Gastro-esophageal reflux disease without esophagitis: K21.9

## 2014-10-31 LAB — CBC WITH DIFFERENTIAL/PLATELET
BASOS ABS: 0 10*3/uL (ref 0–0.1)
BASOS PCT: 0 %
EOS ABS: 0 10*3/uL (ref 0–0.7)
EOS PCT: 0 %
HCT: 37.9 % (ref 35.0–47.0)
Hemoglobin: 12.7 g/dL (ref 12.0–16.0)
LYMPHS PCT: 31 %
Lymphs Abs: 5.6 10*3/uL — ABNORMAL HIGH (ref 1.0–3.6)
MCH: 29.2 pg (ref 26.0–34.0)
MCHC: 33.6 g/dL (ref 32.0–36.0)
MCV: 86.7 fL (ref 80.0–100.0)
MONO ABS: 1 10*3/uL — AB (ref 0.2–0.9)
Monocytes Relative: 6 %
Neutro Abs: 11.5 10*3/uL — ABNORMAL HIGH (ref 1.4–6.5)
Neutrophils Relative %: 63 %
PLATELETS: 146 10*3/uL — AB (ref 150–440)
RBC: 4.37 MIL/uL (ref 3.80–5.20)
RDW: 14 % (ref 11.5–14.5)
WBC: 18.2 10*3/uL — AB (ref 3.6–11.0)

## 2014-10-31 LAB — COMPREHENSIVE METABOLIC PANEL
ALBUMIN: 3.8 g/dL (ref 3.5–5.0)
ALT: 18 U/L (ref 14–54)
ANION GAP: 9 (ref 5–15)
AST: 41 U/L (ref 15–41)
Alkaline Phosphatase: 67 U/L (ref 38–126)
BUN: 53 mg/dL — AB (ref 6–20)
CHLORIDE: 97 mmol/L — AB (ref 101–111)
CO2: 29 mmol/L (ref 22–32)
Calcium: 9.2 mg/dL (ref 8.9–10.3)
Creatinine, Ser: 2.36 mg/dL — ABNORMAL HIGH (ref 0.44–1.00)
GFR calc Af Amer: 22 mL/min — ABNORMAL LOW (ref >60)
GFR, EST NON AFRICAN AMERICAN: 19 mL/min — AB (ref >60)
Glucose, Bld: 119 mg/dL — ABNORMAL HIGH (ref 65–99)
POTASSIUM: 3.5 mmol/L (ref 3.5–5.1)
Sodium: 135 mmol/L (ref 135–145)
TOTAL PROTEIN: 6.9 g/dL (ref 6.5–8.1)
Total Bilirubin: 1.2 mg/dL (ref 0.3–1.2)

## 2014-10-31 LAB — URINALYSIS COMPLETE WITH MICROSCOPIC (ARMC ONLY)
BILIRUBIN URINE: NEGATIVE
Bacteria, UA: NONE SEEN
GLUCOSE, UA: NEGATIVE mg/dL
HGB URINE DIPSTICK: NEGATIVE
LEUKOCYTES UA: NEGATIVE
NITRITE: NEGATIVE
PH: 5 (ref 5.0–8.0)
Protein, ur: NEGATIVE mg/dL
SPECIFIC GRAVITY, URINE: 1.019 (ref 1.005–1.030)

## 2014-10-31 LAB — GLUCOSE, CAPILLARY
Glucose-Capillary: 108 mg/dL — ABNORMAL HIGH (ref 65–99)
Glucose-Capillary: 92 mg/dL (ref 65–99)

## 2014-10-31 LAB — URINE DRUG SCREEN, QUALITATIVE (ARMC ONLY)
Amphetamines, Ur Screen: NOT DETECTED
BARBITURATES, UR SCREEN: NOT DETECTED
BENZODIAZEPINE, UR SCRN: NOT DETECTED
COCAINE METABOLITE, UR ~~LOC~~: NOT DETECTED
Cannabinoid 50 Ng, Ur ~~LOC~~: NOT DETECTED
MDMA (Ecstasy)Ur Screen: NOT DETECTED
METHADONE SCREEN, URINE: NOT DETECTED
OPIATE, UR SCREEN: NOT DETECTED
PHENCYCLIDINE (PCP) UR S: NOT DETECTED
Tricyclic, Ur Screen: NOT DETECTED

## 2014-10-31 LAB — ETHANOL

## 2014-10-31 LAB — LACTIC ACID, PLASMA: LACTIC ACID, VENOUS: 1.6 mmol/L (ref 0.5–2.0)

## 2014-10-31 LAB — TROPONIN I: TROPONIN I: 0.24 ng/mL — AB (ref <0.031)

## 2014-10-31 LAB — LIPASE, BLOOD: Lipase: 26 U/L (ref 11–51)

## 2014-10-31 MED ORDER — SODIUM CHLORIDE 0.9 % IV SOLN: 1.0000 g | Freq: Three times a day (TID) | INTRAVENOUS | Status: DC

## 2014-10-31 MED ORDER — ASPIRIN 300 MG RE SUPP
300.0000 mg | Freq: Once | RECTAL | Status: AC
  Administered 2014-10-31: 300 mg via RECTAL
  Filled 2014-10-31: qty 1

## 2014-10-31 MED ORDER — LORAZEPAM 2 MG/ML IJ SOLN
1.0000 mg | Freq: Once | INTRAMUSCULAR | Status: AC
  Administered 2014-10-31: 2 mg via INTRAVENOUS
  Filled 2014-10-31: qty 1

## 2014-10-31 MED ORDER — NALOXONE HCL 2 MG/2ML IJ SOSY
0.4000 mg | PREFILLED_SYRINGE | Freq: Once | INTRAMUSCULAR | Status: AC
  Administered 2014-10-31: 0.4 mg via INTRAVENOUS
  Filled 2014-10-31: qty 2

## 2014-10-31 MED ORDER — SODIUM CHLORIDE 0.9 % IV SOLN
500.0000 mg | Freq: Two times a day (BID) | INTRAVENOUS | Status: DC
  Administered 2014-10-31 – 2014-11-02 (×5): 500 mg via INTRAVENOUS
  Filled 2014-10-31 (×7): qty 0.5

## 2014-10-31 NOTE — Progress Notes (Signed)
ANTIBIOTIC CONSULT NOTE - INITIAL  Pharmacy Consult for meropenem Indication: pneumonia  Allergies  Allergen Reactions  . Penicillins Hives    Patient Measurements: Height: 5\' 5"  (165.1 cm) Weight: 234 lb 5.6 oz (106.3 kg) IBW/kg (Calculated) : 57 Adjusted Body Weight:   Vital Signs: Temp: 97.8 F (36.6 C) (10/28 2129) Temp Source: Oral (10/28 2129) BP: 131/89 mmHg (10/28 2300) Pulse Rate: 106 (10/28 2300) Intake/Output from previous day:   Intake/Output from this shift:    Labs:  Recent Labs  10/31/14 2144  WBC 18.2*  HGB 12.7  PLT 146*  CREATININE 2.36*   Estimated Creatinine Clearance: 25.7 mL/min (by C-G formula based on Cr of 2.36). No results for input(s): VANCOTROUGH, VANCOPEAK, VANCORANDOM, GENTTROUGH, GENTPEAK, GENTRANDOM, TOBRATROUGH, TOBRAPEAK, TOBRARND, AMIKACINPEAK, AMIKACINTROU, AMIKACIN in the last 72 hours.   Microbiology: No results found for this or any previous visit (from the past 720 hour(s)).  Medical History: Past Medical History  Diagnosis Date  . Diabetes mellitus without complication ([place])   . Arthritis   . Non Hodgkin's lymphoma ([place]) 2009    s/p stem cell transplant  . Hypertension   . MI (myocardial infarction) ([place])     1990s  . TIA (transient ischemic attack)   . Spinal stenosis   . HLD (hyperlipidemia)   . GERD (gastroesophageal reflux disease)     Medications:  Infusions:  . meropenem (MERREM) IV     Assessment: 9 yom with AMS limited history available due to mental state. Questioning tramadol overdose, starting meropenem for aspiration pneumonia.   Goal of Therapy:    Plan:  Expected duration 7 days with resolution of temperature and/or normalization of WBC. Meropenem 500 mg IV Q12H for creatinine clearance approximately 25.7 mL/min.  Laural Benes, Pharm.D.  Clinical Pharmacist 10/31/2014,11:30 PM

## 2014-10-31 NOTE — ED Notes (Signed)
Pt taken to CT, accompanied by RN

## 2014-10-31 NOTE — ED Provider Notes (Addendum)
[facility] Emergency Department Provider Note  ____________________________________________  Time seen: Approximately 9:32 PM  I have reviewed the triage vital signs and the nursing notes.   HISTORY  Chief Complaint Altered Mental Status  History Limited by altered mental status  HPI TIMARA LOMA is a 74 y.o. female patient was seen yesterday for a tramadol overdose as I understand she got Narcan woke up completely and went home fine. Patient was reported by EMS to have been found foaming at the mouth at lunch time. Patient went and laid on that in the bed until EMS was called by her family just prior to arrival here at 9:30 this evening. On EMSs arrival at the house her fingerstick was 25. She got IV D50 fingerstick then was 225. Patient has equal grips follows many commands but has very slurry speech and right side of her face appears to be drooping. I am uncertain if when this started. Patient also complains of pain in the left upper quadrant of the belly on examination and complains of pain in the right leg in the hip area but does not appear to respond to palpation of this. I am unable to get any other history.    Past Medical History  Diagnosis Date  . Diabetes mellitus without complication ([place])   . Arthritis   . Non Hodgkin's lymphoma ([place]) 2009    s/p stem cell transplant  . Hypertension   . MI (myocardial infarction) ([place])     1990s  . TIA (transient ischemic attack)   . Spinal stenosis     Patient Active Problem List   Diagnosis Date Noted  . DDD (degenerative disc disease), lumbar 06/24/2014  . Facet syndrome, lumbar 06/24/2014  . Spinal stenosis, lumbar region, with neurogenic claudication 06/24/2014  . Neuropathy due to secondary diabetes ([place]) 06/24/2014    Past Surgical History  Procedure Laterality Date  . Abdominal hysterectomy    . Toe amputation Left   . Eye surgery Bilateral     Current Outpatient Rx  Name  Route  Sig   Dispense  Refill  . amLODipine (NORVASC) 5 MG tablet   Oral   Take 5 mg by mouth daily.         Marland Kitchen aspirin EC 81 MG tablet   Oral   Take 81 mg by mouth daily.         Marland Kitchen atorvastatin (LIPITOR) 80 MG tablet   Oral   Take 80 mg by mouth daily.         . chlorthalidone (HYGROTON) 25 MG tablet   Oral   Take 25 mg by mouth daily.         . citalopram (CELEXA) 20 MG tablet   Oral   Take 20 mg by mouth daily.         Marland Kitchen gabapentin (NEURONTIN) 300 MG capsule   Oral   Take 300 mg by mouth.         Marland Kitchen lisinopril (PRINIVIL,ZESTRIL) 40 MG tablet   Oral   Take 40 mg by mouth daily.         Marland Kitchen omeprazole (PRILOSEC) 20 MG capsule   Oral   Take 20 mg by mouth daily.         . polyethylene glycol (MIRALAX / GLYCOLAX) packet   Oral   Take 17 g by mouth daily as needed.         . Travoprost, BAK Free, (TRAVATAN) 0.004 % SOLN ophthalmic solution  Both Eyes   Place 1 drop into both eyes.         Marland Kitchen trolamine salicylate (ASPERCREME) 10 % cream   Topical   Apply 1 application topically as needed for muscle pain (left hip).         . Vitamin D, Ergocalciferol, (DRISDOL) 50000 UNITS CAPS capsule   Oral   Take 50,000 Units by mouth every 30 (thirty) days.         . Zinc Oxide (DESITIN) 13 % CREA   Apply externally   Apply topically.         Marland Kitchen acetaminophen (TYLENOL) 500 MG tablet   Oral   Take 500 mg by mouth every 8 (eight) hours as needed.         . brimonidine-timolol (COMBIGAN) 0.2-0.5 % ophthalmic solution   Both Eyes   Place 1 drop into both eyes every 12 (twelve) hours.         . Carboxymethylcellul-Glycerin (OPTIVE) 0.5-0.9 % SOLN   Ophthalmic   Apply 1-2 drops to eye 3 (three) times daily as needed.         . ciprofloxacin (CIPRO) 250 MG tablet   Oral   Take 1 tablet (250 mg total) by mouth 2 (two) times daily. Patient not taking: Reported on 08/12/2014   14 tablet   0   . ciprofloxacin (CIPRO) 250 MG tablet   Oral   Take 1 tablet (250  mg total) by mouth 2 (two) times daily.   14 tablet   0   . docusate sodium (COLACE) 100 MG capsule   Oral   Take 100 mg by mouth at bedtime as needed for mild constipation.         . Insulin Glargine (LANTUS SOLOSTAR) 100 UNIT/ML Solostar Pen   Subcutaneous   Inject 60 Units into the skin daily.         . naproxen (NAPROSYN) 500 MG tablet   Oral   Take 500 mg by mouth every morning.         Marland Kitchen oxyCODONE (ROXICODONE) 5 MG immediate release tablet   Oral   Take 1 tablet (5 mg total) by mouth every 8 (eight) hours as needed.   20 tablet   0     Allergies Penicillins  Family History  Problem Relation Age of Onset  . Hypertension Mother   . Heart disease Mother   . Diabetes Father     Social History Social History  Substance Use Topics  . Smoking status: Former Research scientist (life sciences)  . Smokeless tobacco: None  . Alcohol Use: No    Review of Systems Review of systems unavailable due to altered mental status ____________________________________________   PHYSICAL EXAM:  VITAL SIGNS: ED Triage Vitals  Enc Vitals Group     BP --      Pulse --      Resp --      Temp --      Temp src --      SpO2 --      Weight --      Height --      Head Cir --      Peak Flow --      Pain Score --      Pain Loc --      Pain Edu? --      Excl. in [place]? --     Constitutional: Patient is awake but groggy has slurry speech and follow some commands occasionally. Eyes: Conjunctivae are  normal. PERRL. EOMI. Head: Patient has an old lesion on the left forehead which is recognized by the nurse that saw her yesterday. Nose: No congestion/rhinnorhea. Mouth/Throat: Mucous membranes are moist.  Oropharynx non-erythematous. Neck: No stridor.  No cervical spine tenderness to palpation Cardiovascular: Normal rate, regular rhythm. Grossly normal heart sounds.  Good peripheral circulation. Respiratory: Normal respiratory effort.  No retractions. Lungs CTAB. Patient is not taking deep  breaths Gastrointestinal: Soft and except for in the left upper quadrant which seems to be very tender to palpation No distention. No abdominal bruits. No CVA tenderness. Musculoskeletal: No lower extremity tenderness nor edema I am uncertain if there is any right hip tenderness..  No joint effusions. Neurologic:  Patient moves both arms and legs in a purposeful manner however does not always follow commands. Her grip strength appears to be equal bilaterally. Patient's face is drawn to the right in her speech is very slurry. Skin:  Skin is warm, dry and intact. No rash noted.   ____________________________________________   LABS (all labs ordered are listed, but only abnormal results are displayed)  Labs Reviewed  CBC WITH DIFFERENTIAL/PLATELET - Abnormal; Notable for the following:    WBC 18.2 (*)    Platelets 146 (*)    Neutro Abs 11.5 (*)    Lymphs Abs 5.6 (*)    Monocytes Absolute 1.0 (*)    All other components within normal limits  GLUCOSE, CAPILLARY - Abnormal; Notable for the following:    Glucose-Capillary 108 (*)    All other components within normal limits  COMPREHENSIVE METABOLIC PANEL  ETHANOL  LIPASE, BLOOD  TROPONIN I  LACTIC ACID, PLASMA  LACTIC ACID, PLASMA  URINALYSIS COMPLETEWITH MICROSCOPIC ([facility] ONLY)  URINE DRUG SCREEN, QUALITATIVE ([facility] ONLY)  CBG MONITORING, ED   ____________________________________________  EKG  EKG read and interpreted by me shows sinus tach at a rate of 105 normal axis nonspecific ST-T wave abnormalities RADIOLOGY  pending ____________________________________________   PROCEDURES   ____________________________________________   INITIAL IMPRESSION / ASSESSMENT AND PLAN / ED COURSE  Pertinent labs & imaging results that were available during my care of the patient were reviewed by me and considered in my medical decision making (see chart for details).   ____________________________________________   FINAL CLINICAL  IMPRESSION(S) / ED DIAGNOSES  Final diagnoses:  Altered mental status, unspecified altered mental status type      Nena Polio, MD 10/31/14 2212 Discussed patient with hospitalist we will plan on admitting patient has an elevated troponin and elevated white count worsening renal failure and stroke like symptoms. The patient's daughter came after I had left the room talking to the nurse briefly and then left again before I could get back in the room. Patient's daughter reported she was last seen well midnight last night. We are currently waiting on CT scans.  Nena Polio, MD 11/02/14 [phone]

## 2014-10-31 NOTE — ED Notes (Signed)
Per daughter, pt recently seen in ED for pain to left hip.  Pt also saw [facility] and was prescribed tramadol.  Daughter reports after taking tramadol, pt had AMS and decrease in functionality.  This occurred 2 days ago.  Daughter states they were told medication would "wear off" but still hasn't quite been self.  Pt was more alert yesterday and at midnight yesterday pt was more alert and "fine".  Last known well 12am 10/28.

## 2014-10-31 NOTE — ED Notes (Signed)
Pt arrived from home via EMS for AMS. Per ems, mother was found floor when daughter came home. Pt was "foaming at the mouth" around noon.

## 2014-11-01 ENCOUNTER — Inpatient Hospital Stay: Payer: Medicare (Managed Care)

## 2014-11-01 ENCOUNTER — Inpatient Hospital Stay
Admit: 2014-11-01 | Discharge: 2014-11-01 | Disposition: A | Discharge status: Home / Self Care | Payer: Medicare (Managed Care) | Attending: Internal Medicine | Admitting: Internal Medicine

## 2014-11-01 ENCOUNTER — Encounter: Payer: Self-pay | Admitting: Emergency Medicine

## 2014-11-01 DIAGNOSIS — Z833 Family history of diabetes mellitus: Secondary | ICD-10-CM | POA: Diagnosis not present

## 2014-11-01 DIAGNOSIS — N179 Acute kidney failure, unspecified: Secondary | ICD-10-CM | POA: Insufficient documentation

## 2014-11-01 DIAGNOSIS — E11649 Type 2 diabetes mellitus with hypoglycemia without coma: Secondary | ICD-10-CM | POA: Diagnosis present

## 2014-11-01 DIAGNOSIS — R1012 Left upper quadrant pain: Secondary | ICD-10-CM | POA: Diagnosis present

## 2014-11-01 DIAGNOSIS — E1122 Type 2 diabetes mellitus with diabetic chronic kidney disease: Secondary | ICD-10-CM | POA: Diagnosis present

## 2014-11-01 DIAGNOSIS — R7989 Other specified abnormal findings of blood chemistry: Secondary | ICD-10-CM | POA: Diagnosis not present

## 2014-11-01 DIAGNOSIS — R29810 Facial weakness: Secondary | ICD-10-CM | POA: Diagnosis present

## 2014-11-01 DIAGNOSIS — N183 Chronic kidney disease, stage 3 (moderate): Secondary | ICD-10-CM | POA: Diagnosis present

## 2014-11-01 DIAGNOSIS — Z791 Long term (current) use of non-steroidal anti-inflammatories (NSAID): Secondary | ICD-10-CM | POA: Diagnosis not present

## 2014-11-01 DIAGNOSIS — J690 Pneumonitis due to inhalation of food and vomit: Secondary | ICD-10-CM | POA: Diagnosis present

## 2014-11-01 DIAGNOSIS — I129 Hypertensive chronic kidney disease with stage 1 through stage 4 chronic kidney disease, or unspecified chronic kidney disease: Secondary | ICD-10-CM | POA: Diagnosis present

## 2014-11-01 DIAGNOSIS — M25559 Pain in unspecified hip: Secondary | ICD-10-CM | POA: Diagnosis present

## 2014-11-01 DIAGNOSIS — G9340 Encephalopathy, unspecified: Secondary | ICD-10-CM | POA: Diagnosis not present

## 2014-11-01 DIAGNOSIS — Z79899 Other long term (current) drug therapy: Secondary | ICD-10-CM | POA: Diagnosis not present

## 2014-11-01 DIAGNOSIS — Z8572 Personal history of non-Hodgkin lymphomas: Secondary | ICD-10-CM | POA: Diagnosis not present

## 2014-11-01 DIAGNOSIS — I252 Old myocardial infarction: Secondary | ICD-10-CM | POA: Diagnosis not present

## 2014-11-01 DIAGNOSIS — E785 Hyperlipidemia, unspecified: Secondary | ICD-10-CM | POA: Diagnosis present

## 2014-11-01 DIAGNOSIS — M4800 Spinal stenosis, site unspecified: Secondary | ICD-10-CM | POA: Diagnosis present

## 2014-11-01 DIAGNOSIS — Z89422 Acquired absence of other left toe(s): Secondary | ICD-10-CM | POA: Diagnosis not present

## 2014-11-01 DIAGNOSIS — I639 Cerebral infarction, unspecified: Secondary | ICD-10-CM | POA: Diagnosis present

## 2014-11-01 DIAGNOSIS — I2510 Atherosclerotic heart disease of native coronary artery without angina pectoris: Secondary | ICD-10-CM | POA: Diagnosis present

## 2014-11-01 DIAGNOSIS — K219 Gastro-esophageal reflux disease without esophagitis: Secondary | ICD-10-CM | POA: Diagnosis present

## 2014-11-01 DIAGNOSIS — N170 Acute kidney failure with tubular necrosis: Secondary | ICD-10-CM | POA: Diagnosis present

## 2014-11-01 DIAGNOSIS — R748 Abnormal levels of other serum enzymes: Secondary | ICD-10-CM | POA: Diagnosis present

## 2014-11-01 DIAGNOSIS — Z9484 Stem cells transplant status: Secondary | ICD-10-CM | POA: Diagnosis not present

## 2014-11-01 DIAGNOSIS — Z803 Family history of malignant neoplasm of breast: Secondary | ICD-10-CM | POA: Diagnosis not present

## 2014-11-01 DIAGNOSIS — N189 Chronic kidney disease, unspecified: Secondary | ICD-10-CM

## 2014-11-01 DIAGNOSIS — R4182 Altered mental status, unspecified: Secondary | ICD-10-CM | POA: Diagnosis present

## 2014-11-01 DIAGNOSIS — R000 Tachycardia, unspecified: Secondary | ICD-10-CM | POA: Diagnosis present

## 2014-11-01 DIAGNOSIS — Z7982 Long term (current) use of aspirin: Secondary | ICD-10-CM | POA: Diagnosis not present

## 2014-11-01 DIAGNOSIS — M1990 Unspecified osteoarthritis, unspecified site: Secondary | ICD-10-CM | POA: Diagnosis present

## 2014-11-01 DIAGNOSIS — Z794 Long term (current) use of insulin: Secondary | ICD-10-CM | POA: Diagnosis not present

## 2014-11-01 DIAGNOSIS — Z8673 Personal history of transient ischemic attack (TIA), and cerebral infarction without residual deficits: Secondary | ICD-10-CM | POA: Diagnosis not present

## 2014-11-01 DIAGNOSIS — Z880 Allergy status to penicillin: Secondary | ICD-10-CM | POA: Diagnosis not present

## 2014-11-01 DIAGNOSIS — Z87891 Personal history of nicotine dependence: Secondary | ICD-10-CM | POA: Diagnosis not present

## 2014-11-01 DIAGNOSIS — Z8249 Family history of ischemic heart disease and other diseases of the circulatory system: Secondary | ICD-10-CM | POA: Diagnosis not present

## 2014-11-01 LAB — LIPID PANEL
CHOLESTEROL: 99 mg/dL (ref 0–200)
HDL: 44 mg/dL (ref >40)
LDL Cholesterol: 42 mg/dL (ref 0–99)
TRIGLYCERIDES: 63 mg/dL (ref <150)
Total CHOL/HDL Ratio: 2.3 RATIO
VLDL: 13 mg/dL (ref 0–40)

## 2014-11-01 LAB — GLUCOSE, CAPILLARY
GLUCOSE-CAPILLARY: 70 mg/dL (ref 65–99)
GLUCOSE-CAPILLARY: 73 mg/dL (ref 65–99)
GLUCOSE-CAPILLARY: 84 mg/dL (ref 65–99)
GLUCOSE-CAPILLARY: 87 mg/dL (ref 65–99)
GLUCOSE-CAPILLARY: 83 mg/dL (ref 65–99)
GLUCOSE-CAPILLARY: 118 mg/dL — AB (ref 65–99)
GLUCOSE-CAPILLARY: 82 mg/dL (ref 65–99)
Glucose-Capillary: 65 mg/dL (ref 65–99)
Glucose-Capillary: 68 mg/dL (ref 65–99)
Glucose-Capillary: 72 mg/dL (ref 65–99)
Glucose-Capillary: 83 mg/dL (ref 65–99)

## 2014-11-01 LAB — HEMOGLOBIN A1C: Hgb A1c MFr Bld: 6.2 % — ABNORMAL HIGH (ref 4.0–6.0)

## 2014-11-01 LAB — MRSA PCR SCREENING: MRSA by PCR: POSITIVE — AB

## 2014-11-01 LAB — CBC
HEMATOCRIT: 35.3 % (ref 35.0–47.0)
HEMOGLOBIN: 12 g/dL (ref 12.0–16.0)
MCH: 29.3 pg (ref 26.0–34.0)
MCHC: 34 g/dL (ref 32.0–36.0)
MCV: 86.3 fL (ref 80.0–100.0)
PLATELETS: 134 10*3/uL — AB (ref 150–440)
RBC: 4.09 MIL/uL (ref 3.80–5.20)
RDW: 14 % (ref 11.5–14.5)
WBC: 16.3 10*3/uL — ABNORMAL HIGH (ref 3.6–11.0)

## 2014-11-01 LAB — BASIC METABOLIC PANEL
Anion gap: 11 (ref 5–15)
BUN: 55 mg/dL — AB (ref 6–20)
CHLORIDE: 99 mmol/L — AB (ref 101–111)
CO2: 27 mmol/L (ref 22–32)
Calcium: 9 mg/dL (ref 8.9–10.3)
Creatinine, Ser: 2.12 mg/dL — ABNORMAL HIGH (ref 0.44–1.00)
GFR calc non Af Amer: 22 mL/min — ABNORMAL LOW (ref >60)
GFR, EST AFRICAN AMERICAN: 25 mL/min — AB (ref >60)
Glucose, Bld: 83 mg/dL (ref 65–99)
Potassium: 3.4 mmol/L — ABNORMAL LOW (ref 3.5–5.1)
Sodium: 137 mmol/L (ref 135–145)

## 2014-11-01 LAB — AMMONIA: AMMONIA: 28 umol/L (ref 9–35)

## 2014-11-01 LAB — TROPONIN I
TROPONIN I: 0.31 ng/mL — AB (ref <0.031)
TROPONIN I: 0.48 ng/mL — AB (ref <0.031)

## 2014-11-01 MED ORDER — ACETAMINOPHEN 325 MG PO TABS: 650.0000 mg | ORAL_TABLET | Freq: Four times a day (QID) | ORAL | Status: DC | PRN

## 2014-11-01 MED ORDER — HEPARIN SODIUM (PORCINE) 5000 UNIT/ML IJ SOLN
5000.0000 [IU] | Freq: Three times a day (TID) | INTRAMUSCULAR | Status: DC
  Administered 2014-11-01 – 2014-11-04 (×10): 5000 [IU] via SUBCUTANEOUS
  Filled 2014-11-01 (×10): qty 1

## 2014-11-01 MED ORDER — INSULIN ASPART 100 UNIT/ML ~~LOC~~ SOLN
0.0000 [IU] | Freq: Four times a day (QID) | SUBCUTANEOUS | Status: DC
  Administered 2014-11-02: 1 [IU] via SUBCUTANEOUS
  Administered 2014-11-02 – 2014-11-03 (×3): 2 [IU] via SUBCUTANEOUS
  Filled 2014-11-01: qty 1
  Filled 2014-11-01 (×3): qty 2

## 2014-11-01 MED ORDER — PNEUMOCOCCAL 13-VAL CONJ VACC IM SUSP
0.5000 mL | INTRAMUSCULAR | Status: DC
Start: 2014-11-02 — End: 2014-11-04
  Filled 2014-11-01 (×2): qty 0.5

## 2014-11-01 MED ORDER — MUPIROCIN 2 % EX OINT
1.0000 "application " | TOPICAL_OINTMENT | Freq: Two times a day (BID) | CUTANEOUS | Status: DC
  Administered 2014-11-01 – 2014-11-04 (×8): 1 via NASAL
  Filled 2014-11-01: qty 22

## 2014-11-01 MED ORDER — BRIMONIDINE TARTRATE-TIMOLOL 0.2-0.5 % OP SOLN: 1.0000 [drp] | Freq: Two times a day (BID) | OPHTHALMIC | Status: DC

## 2014-11-01 MED ORDER — ONDANSETRON HCL 4 MG PO TABS: 4.0000 mg | ORAL_TABLET | Freq: Four times a day (QID) | ORAL | Status: DC | PRN

## 2014-11-01 MED ORDER — ACETAMINOPHEN 650 MG RE SUPP: 650.0000 mg | Freq: Four times a day (QID) | RECTAL | Status: DC | PRN

## 2014-11-01 MED ORDER — STROKE: EARLY STAGES OF RECOVERY BOOK: Freq: Once | Status: DC

## 2014-11-01 MED ORDER — DEXTROSE 50 % IV SOLN
25.0000 mL | Freq: Once | INTRAVENOUS | Status: AC
  Administered 2014-11-01: 25 mL via INTRAVENOUS

## 2014-11-01 MED ORDER — INFLUENZA VAC SPLIT QUAD 0.5 ML IM SUSY
0.5000 mL | PREFILLED_SYRINGE | INTRAMUSCULAR | Status: AC
  Administered 2014-11-02: 0.5 mL via INTRAMUSCULAR
  Filled 2014-11-01: qty 0.5

## 2014-11-01 MED ORDER — DEXTROSE-NACL 5-0.45 % IV SOLN
INTRAVENOUS | Status: DC
  Administered 2014-11-02 – 2014-11-04 (×3): via INTRAVENOUS

## 2014-11-01 MED ORDER — CHLORHEXIDINE GLUCONATE CLOTH 2 % EX PADS
6.0000 | MEDICATED_PAD | Freq: Every day | CUTANEOUS | Status: DC
  Administered 2014-11-01 – 2014-11-04 (×4): 6 via TOPICAL

## 2014-11-01 MED ORDER — TIMOLOL MALEATE 0.5 % OP SOLN
1.0000 [drp] | Freq: Two times a day (BID) | OPHTHALMIC | Status: DC
  Administered 2014-11-01 – 2014-11-04 (×6): 1 [drp] via OPHTHALMIC
  Filled 2014-11-01: qty 5

## 2014-11-01 MED ORDER — DEXTROSE 50 % IV SOLN
INTRAVENOUS | Status: AC
  Administered 2014-11-01: 25 mL via INTRAVENOUS
  Filled 2014-11-01: qty 50

## 2014-11-01 MED ORDER — SODIUM CHLORIDE 0.9 % IJ SOLN
3.0000 mL | Freq: Two times a day (BID) | INTRAMUSCULAR | Status: DC
  Administered 2014-11-01 – 2014-11-02 (×4): 3 mL via INTRAVENOUS

## 2014-11-01 MED ORDER — SODIUM CHLORIDE 0.9 % IV SOLN
INTRAVENOUS | Status: DC
  Administered 2014-11-01: 13:00:00 via INTRAVENOUS

## 2014-11-01 MED ORDER — ONDANSETRON HCL 4 MG/2ML IJ SOLN: 4.0000 mg | Freq: Four times a day (QID) | INTRAMUSCULAR | Status: DC | PRN

## 2014-11-01 MED ORDER — BRIMONIDINE TARTRATE 0.2 % OP SOLN
1.0000 [drp] | Freq: Two times a day (BID) | OPHTHALMIC | Status: DC
  Administered 2014-11-01 – 2014-11-04 (×6): 1 [drp] via OPHTHALMIC
  Filled 2014-11-01: qty 5

## 2014-11-01 MED ORDER — DEXTROSE-NACL 5-0.45 % IV SOLN
INTRAVENOUS | Status: DC
  Administered 2014-11-01: via INTRAVENOUS

## 2014-11-01 NOTE — Progress Notes (Signed)
Paged prime doc concerning pt's blood sugar dropping as night progresses.  Per Dr. Jannifer Franklin, change NS to D51/2 NS at 62ml/hr.  Also asked about possibility of pt moving to floor.  OK for pt to move to 1C.

## 2014-11-01 NOTE — Consult Note (Signed)
CC: AMS  HPI: [name] is an 74 y.o. female ith a h/o non-hodgkins lymphoma who was admitted with altered mental status. She was found to have an abnormal troponin and cardiology is consulted. The patient cannot provide me any history. Her sister was with her yesterday and noted that she was unresponsive. She was noted to be awake Thursday night.   Her daughter states that her mother was recently brought to the emergency department for evaluation for sciatic pain. At that point she was given a prescription for a long-acting oxycodone.   The daughter went to see her mother and found her "foaming at the mouth" and with what seemed to be a facial droop. She was noted to have a low blood sugar in the 20s.   Past Medical History  Diagnosis Date  . Diabetes mellitus without complication ([place])   . Arthritis   . Non Hodgkin's lymphoma ([place]) 2009    s/p stem cell transplant  . Hypertension   . MI (myocardial infarction) ([place])     1990s  . TIA (transient ischemic attack)   . Spinal stenosis   . HLD (hyperlipidemia)   . GERD (gastroesophageal reflux disease)     Past Surgical History  Procedure Laterality Date  . Abdominal hysterectomy    . Toe amputation Left   . Eye surgery Bilateral     Family History  Problem Relation Age of Onset  . Hypertension Mother   . Heart disease Mother   . Diabetes Father   . Breast cancer Sister     Social History:  reports that she has quit smoking. She does not have any smokeless tobacco history on file. She reports that she does not drink alcohol or use illicit drugs.  Allergies  Allergen Reactions  . Penicillins Hives    Medications: I have reviewed the patient's current medications.  ROS: Not able to obtain due to current mental status   Physical Examination: Blood pressure 79/56, pulse 73, temperature 98.3 F (36.8 C), temperature source Oral, resp. rate 17, height 5\' 4"  (1.626 m), weight 208 lb 8.9 oz (94.6 kg), SpO2 97 %.  EOM  intact  Visual fields appear to be intact L facial droop is evident Significant weakness LUE and LLE Not following commands   Laboratory Studies:   Basic Metabolic Panel:  Recent Labs Lab 10/31/14 2144 11/01/14 0248  NA 135 137  K 3.5 3.4*  CL 97* 99*  CO2 29 27  GLUCOSE 119* 83  BUN 53* 55*  CREATININE 2.36* 2.12*  CALCIUM 9.2 9.0    Liver Function Tests:  Recent Labs Lab 10/31/14 2144  AST 41  ALT 18  ALKPHOS 67  BILITOT 1.2  PROT 6.9  ALBUMIN 3.8    Recent Labs Lab 10/31/14 2144  LIPASE 26    Recent Labs Lab 11/01/14 1021  AMMONIA 28    CBC:  Recent Labs Lab 10/31/14 2144 11/01/14 0248  WBC 18.2* 16.3*  NEUTROABS 11.5*  --   HGB 12.7 12.0  HCT 37.9 35.3  MCV 86.7 86.3  PLT 146* 134*    Cardiac Enzymes:  Recent Labs Lab 10/31/14 2144 11/01/14 0248 11/01/14 0844  TROPONINI 0.24* 0.31* 0.48*    BNP: Invalid input(s): POCBNP  CBG:  Recent Labs Lab 11/01/14 0343 11/01/14 0525 11/01/14 0706 11/01/14 1128 11/01/14 1420  GLUCAP 84 72 87 83 39    Microbiology: Results for orders placed or performed during the hospital encounter of 10/31/14  Culture, blood (routine x  2)     Status: None (Preliminary result)   Collection Time: 10/31/14 11:12 PM  Result Value Ref Range Status   Specimen Description BLOOD LEFT ASSIST CONTROL  Final   Special Requests BOTTLES DRAWN AEROBIC AND ANAEROBIC 4CC  Final   Culture NO GROWTH < 12 HOURS  Final   Report Status PENDING  Incomplete  Culture, blood (routine x 2)     Status: None (Preliminary result)   Collection Time: 10/31/14 11:12 PM  Result Value Ref Range Status   Specimen Description BLOOD LEFT HAND  Final   Special Requests BOTTLES DRAWN AEROBIC AND ANAEROBIC 4CC  Final   Culture NO GROWTH < 12 HOURS  Final   Report Status PENDING  Incomplete  MRSA PCR Screening     Status: Abnormal   Collection Time: 11/01/14  1:35 AM  Result Value Ref Range Status   MRSA by PCR POSITIVE (A)  NEGATIVE Final    Comment:        The GeneXpert MRSA Assay (FDA approved for NASAL specimens only), is one component of a comprehensive MRSA colonization surveillance program. It is not intended to diagnose MRSA infection nor to guide or monitor treatment for MRSA infections. CRITICAL RESULT CALLED TO, READ BACK BY AND VERIFIED WITH: RENEE BABB RN AT 0245 11/01/2014     Coagulation Studies: No results for input(s): LABPROT, INR in the last 72 hours.  Urinalysis:  Recent Labs Lab 10/31/14 2144  COLORURINE YELLOW*  LABSPEC 1.019  PHURINE 5.0  GLUCOSEU NEGATIVE  HGBUR NEGATIVE  BILIRUBINUR NEGATIVE  KETONESUR TRACE*  PROTEINUR NEGATIVE  NITRITE NEGATIVE  LEUKOCYTESUR NEGATIVE    Lipid Panel:     Component Value Date/Time   CHOL 99 11/01/2014 0248   TRIG 63 11/01/2014 0248   HDL 44 11/01/2014 0248   CHOLHDL 2.3 11/01/2014 0248   VLDL 13 11/01/2014 0248   LDLCALC 42 11/01/2014 0248    HgbA1C:  Lab Results  Component Value Date   HGBA1C 6.2* 11/01/2014    Urine Drug Screen:     Component Value Date/Time   LABOPIA NONE DETECTED 10/31/2014 2144   LABBENZ NONE DETECTED 10/31/2014 2144   AMPHETMU NONE DETECTED 10/31/2014 2144   THCU NONE DETECTED 10/31/2014 2144   LABBARB NONE DETECTED 10/31/2014 2144    Alcohol Level:  Recent Labs Lab 10/31/14 2144  ETH <5     Imaging: Ct Abdomen Pelvis Wo Contrast  10/31/2014  CLINICAL DATA:  Found down, left upper quadrant abdominal pain, history of non-Hodgkin's lymphoma EXAM: CT CHEST, ABDOMEN AND PELVIS WITHOUT CONTRAST TECHNIQUE: Multidetector CT imaging of the chest, abdomen and pelvis was performed following the standard protocol without IV contrast. COMPARISON:  02/23/2012 FINDINGS: CT CHEST FINDINGS Mediastinum/Nodes: The heart is top-normal in size. No pericardial effusion. Coronary atherosclerosis. Atherosclerotic calcifications of the aortic arch. Dominant 17 mm short axis left supraclavicular node, new,  suspicious for lymphomatous recurrence given the patient's clinical history. Additional prominent thoracic lymph nodes, new/progressed from 2014, including: --12 mm short axis left supraclavicular node (series 2/ image 12) --6 mm short axis prevascular node (series 2/ image 20) --8 mm short axis AP window node (series 2/ image 22) --8 mm short axis subcarinal node (series 2/ image 26) Visualized thyroid is unremarkable. Lungs/Pleura: Evaluation of the lung parenchyma is constrained by respiratory motion. Patchy/nodular ground-glass opacities in the posterior right upper lobe and right lower lobe, suspicious for pneumonia, possibly on the basis of aspiration. Minimal dependent atelectasis at the lung bases, likely atelectasis.  Underlying mild centrilobular and paraseptal emphysematous changes. No pleural effusion or pneumothorax. Musculoskeletal: Degenerative changes of the thoracic spine. CT ABDOMEN PELVIS FINDINGS Motion degraded images. Hepatobiliary: Unenhanced liver is unremarkable. Layering small gallstones with in the gallbladder neck (series 2/image 36), without associated inflammatory changes. No intrahepatic or extrahepatic ductal dilatation. Pancreas: Within normal limits. Spleen: Normal in size. Adrenals/Urinary Tract: Adrenal glands are within normal limits. Kidneys are unremarkable. No renal, ureteral, or bladder calculi.  No hydronephrosis. Bladder is within normal limits. Stomach/Bowel: Stomach is within normal limits. No evidence of bowel obstruction. Normal appendix (series 2/image 95). Vascular/Lymphatic: No evidence of abdominal aortic aneurysm. Mildly prominent upper abdominal/retroperitoneal lymph nodes, new/progressed from 2014, including: --9 mm short axis gastrohepatic node (series 2/ image 51) --10 mm short axis node in the porta hepatis (series 2/image 23) --9 mm short axis portacaval node (series 2/ image 58) --9 mm short axis left para-aortic node (series 2/image 69) --8 mm short axis  aortocaval node (series 2/image 69) Reproductive: Status post hysterectomy. No adnexal masses. Other: No abdominopelvic ascites. Musculoskeletal: Mild degenerative changes of the lumbar spine. IMPRESSION: Patchy/nodular ground-glass opacities in the posterior right upper lobe and right lower lobe, suspicious for pneumonia, possibly on the basis of aspiration. No evidence of bowel obstruction. Normal appendix. Cholelithiasis, without associated inflammatory changes. No CT findings to account for the patient's left upper quadrant abdominal pain. Dominant 17 mm short axis left supraclavicular node, new, suspicious for lymphomatous recurrence given the patient's history. Additional mildly prominent lymph nodes in the chest, abdomen, and pelvis, as above. Electronically Signed   By: Julian Hy M.D.   On: 10/31/2014 23:03   Ct Head Wo Contrast  10/31/2014  CLINICAL DATA:  Altered mental status.  Apparent fall EXAM: CT HEAD WITHOUT CONTRAST CT CERVICAL SPINE WITHOUT CONTRAST TECHNIQUE: Multidetector CT imaging of the head and cervical spine was performed following the standard protocol without intravenous contrast. Multiplanar CT image reconstructions of the cervical spine were also generated. COMPARISON:  Head CT October 29, 2014 FINDINGS: CT HEAD FINDINGS Moderate diffuse atrophy is stable. There is no intracranial mass, hemorrhage, extra-axial fluid collection, or midline shift. Patchy small vessel disease in the centra semiovale bilaterally is stable. There is no new gray-white compartment lesion. No acute infarct. Bony calvarium appears intact. The mastoid air cells are clear. CT CERVICAL SPINE FINDINGS There is no fracture or appreciable spondylolisthesis. Prevertebral soft tissues and predental space regions are normal. There is moderate disc space narrowing at C5-6, C6-7, and C7-T1. There is facet hypertrophy at multiple levels bilaterally, most pronounced at C5-6 on the left. No frank disc extrusion or  high-grade stenosis. IMPRESSION: CT head: Atrophy with mild periventricular small vessel disease. No intracranial mass, hemorrhage, or acute appearing infarct. CT cervical spine: Multilevel osteoarthritic change. No demonstrable fracture or spondylolisthesis. Electronically Signed   By: Lowella Grip III M.D.   On: 10/31/2014 22:50   Ct Chest Wo Contrast  10/31/2014  CLINICAL DATA:  Found down, left upper quadrant abdominal pain, history of non-Hodgkin's lymphoma EXAM: CT CHEST, ABDOMEN AND PELVIS WITHOUT CONTRAST TECHNIQUE: Multidetector CT imaging of the chest, abdomen and pelvis was performed following the standard protocol without IV contrast. COMPARISON:  02/23/2012 FINDINGS: CT CHEST FINDINGS Mediastinum/Nodes: The heart is top-normal in size. No pericardial effusion. Coronary atherosclerosis. Atherosclerotic calcifications of the aortic arch. Dominant 17 mm short axis left supraclavicular node, new, suspicious for lymphomatous recurrence given the patient's clinical history. Additional prominent thoracic lymph nodes, new/progressed from 2014, including: --12 mm  short axis left supraclavicular node (series 2/ image 12) --6 mm short axis prevascular node (series 2/ image 20) --8 mm short axis AP window node (series 2/ image 22) --8 mm short axis subcarinal node (series 2/ image 26) Visualized thyroid is unremarkable. Lungs/Pleura: Evaluation of the lung parenchyma is constrained by respiratory motion. Patchy/nodular ground-glass opacities in the posterior right upper lobe and right lower lobe, suspicious for pneumonia, possibly on the basis of aspiration. Minimal dependent atelectasis at the lung bases, likely atelectasis. Underlying mild centrilobular and paraseptal emphysematous changes. No pleural effusion or pneumothorax. Musculoskeletal: Degenerative changes of the thoracic spine. CT ABDOMEN PELVIS FINDINGS Motion degraded images. Hepatobiliary: Unenhanced liver is unremarkable. Layering small  gallstones with in the gallbladder neck (series 2/image 36), without associated inflammatory changes. No intrahepatic or extrahepatic ductal dilatation. Pancreas: Within normal limits. Spleen: Normal in size. Adrenals/Urinary Tract: Adrenal glands are within normal limits. Kidneys are unremarkable. No renal, ureteral, or bladder calculi.  No hydronephrosis. Bladder is within normal limits. Stomach/Bowel: Stomach is within normal limits. No evidence of bowel obstruction. Normal appendix (series 2/image 95). Vascular/Lymphatic: No evidence of abdominal aortic aneurysm. Mildly prominent upper abdominal/retroperitoneal lymph nodes, new/progressed from 2014, including: --9 mm short axis gastrohepatic node (series 2/ image 51) --10 mm short axis node in the porta hepatis (series 2/image 23) --9 mm short axis portacaval node (series 2/ image 58) --9 mm short axis left para-aortic node (series 2/image 69) --8 mm short axis aortocaval node (series 2/image 69) Reproductive: Status post hysterectomy. No adnexal masses. Other: No abdominopelvic ascites. Musculoskeletal: Mild degenerative changes of the lumbar spine. IMPRESSION: Patchy/nodular ground-glass opacities in the posterior right upper lobe and right lower lobe, suspicious for pneumonia, possibly on the basis of aspiration. No evidence of bowel obstruction. Normal appendix. Cholelithiasis, without associated inflammatory changes. No CT findings to account for the patient's left upper quadrant abdominal pain. Dominant 17 mm short axis left supraclavicular node, new, suspicious for lymphomatous recurrence given the patient's history. Additional mildly prominent lymph nodes in the chest, abdomen, and pelvis, as above. Electronically Signed   By: Julian Hy M.D.   On: 10/31/2014 23:03   Ct Cervical Spine Wo Contrast  10/31/2014  CLINICAL DATA:  Altered mental status.  Apparent fall EXAM: CT HEAD WITHOUT CONTRAST CT CERVICAL SPINE WITHOUT CONTRAST TECHNIQUE:  Multidetector CT imaging of the head and cervical spine was performed following the standard protocol without intravenous contrast. Multiplanar CT image reconstructions of the cervical spine were also generated. COMPARISON:  Head CT October 29, 2014 FINDINGS: CT HEAD FINDINGS Moderate diffuse atrophy is stable. There is no intracranial mass, hemorrhage, extra-axial fluid collection, or midline shift. Patchy small vessel disease in the centra semiovale bilaterally is stable. There is no new gray-white compartment lesion. No acute infarct. Bony calvarium appears intact. The mastoid air cells are clear. CT CERVICAL SPINE FINDINGS There is no fracture or appreciable spondylolisthesis. Prevertebral soft tissues and predental space regions are normal. There is moderate disc space narrowing at C5-6, C6-7, and C7-T1. There is facet hypertrophy at multiple levels bilaterally, most pronounced at C5-6 on the left. No frank disc extrusion or high-grade stenosis. IMPRESSION: CT head: Atrophy with mild periventricular small vessel disease. No intracranial mass, hemorrhage, or acute appearing infarct. CT cervical spine: Multilevel osteoarthritic change. No demonstrable fracture or spondylolisthesis. Electronically Signed   By: Lowella Grip III M.D.   On: 10/31/2014 22:50     Assessment/Plan:  74 y.o. female ith a h/o non-hodgkins lymphoma who was admitted  with altered mental status. She was found to have an abnormal troponin and cardiology is consulted. The patient cannot provide me any history. Her sister was with her yesterday and noted that she was unresponsive. She was noted to be awake Thursday night.   Her daughter states that her mother was recently brought to the emergency department for evaluation for sciatic pain. At that point she was given a prescription for a long-acting oxycodone.   The daughter went to see her mother and found her "foaming at the mouth" and with what seemed to be a facial droop. She  was noted to have a low blood sugar in the 20s.   Do suspect R MCA stroke and AMS likely in setting of aspiration PNA  Agree with MRI  Pt is on Rectal ASA  Pt has not taken any pain medications recently.   D/w family at bedside.   11/01/2014, 2:45 PM

## 2014-11-01 NOTE — Progress Notes (Signed)
PT Attempt Note  Patient Details Name: [name] MRN: [id number] DOB: 02-18-1940   Cancelled Treatment:    Reason Eval/Treat Not Completed: Medical issues which prohibited therapy;Patient's level of consciousness. Chart reviewed and RN consulted. Troponin currently trending upward. RN reports that pt is currently not response to verbal stimulus. Pt is not currently appropriate for PT evaluation. Will attempt evaluation at later time/date as patient is appropriate.  Lyndel Safe Arneda Sappington PT, DPT   Aviv Lengacher 11/01/2014, 12:19 PM

## 2014-11-01 NOTE — Consult Note (Signed)
Reason for Consult:elevated troponin  Referring Physician: Dr.  [name] is an 74 y.o. female.   HPI: The patient is a 74 yo woman with a h/o non-hodgkins lymphoma who was admitted with altered mental status. She was found to have an abnormal troponin and cardiology is consulted. The patient cannot provide me any history. Her sister was with her yesterday and noted that she was unresponsive. She was noted to be awake Thursday night. She did not c/o chest pain or sob. She has multiple medical problems as noted in the PMH.   PMH: Past Medical History  Diagnosis Date  . Diabetes mellitus without complication ([place])   . Arthritis   . Non Hodgkin's lymphoma ([place]) 2009    s/p stem cell transplant  . Hypertension   . MI (myocardial infarction) ([place])     1990s  . TIA (transient ischemic attack)   . Spinal stenosis   . HLD (hyperlipidemia)   . GERD (gastroesophageal reflux disease)     PSHX: Past Surgical History  Procedure Laterality Date  . Abdominal hysterectomy    . Toe amputation Left   . Eye surgery Bilateral     FAMHX: Family History  Problem Relation Age of Onset  . Hypertension Mother   . Heart disease Mother   . Diabetes Father   . Breast cancer Sister     Social History:  reports that she has quit smoking. She does not have any smokeless tobacco history on file. She reports that she does not drink alcohol or use illicit drugs.  Allergies:  Allergies  Allergen Reactions  . Penicillins Hives    Medications: I have reviewed the patient's current medications.  Ct Abdomen Pelvis Wo Contrast  10/31/2014  CLINICAL DATA:  Found down, left upper quadrant abdominal pain, history of non-Hodgkin's lymphoma EXAM: CT CHEST, ABDOMEN AND PELVIS WITHOUT CONTRAST TECHNIQUE: Multidetector CT imaging of the chest, abdomen and pelvis was performed following the standard protocol without IV contrast. COMPARISON:  02/23/2012 FINDINGS: CT CHEST FINDINGS Mediastinum/Nodes: The  heart is top-normal in size. No pericardial effusion. Coronary atherosclerosis. Atherosclerotic calcifications of the aortic arch. Dominant 17 mm short axis left supraclavicular node, new, suspicious for lymphomatous recurrence given the patient's clinical history. Additional prominent thoracic lymph nodes, new/progressed from 2014, including: --12 mm short axis left supraclavicular node (series 2/ image 12) --6 mm short axis prevascular node (series 2/ image 20) --8 mm short axis AP window node (series 2/ image 22) --8 mm short axis subcarinal node (series 2/ image 26) Visualized thyroid is unremarkable. Lungs/Pleura: Evaluation of the lung parenchyma is constrained by respiratory motion. Patchy/nodular ground-glass opacities in the posterior right upper lobe and right lower lobe, suspicious for pneumonia, possibly on the basis of aspiration. Minimal dependent atelectasis at the lung bases, likely atelectasis. Underlying mild centrilobular and paraseptal emphysematous changes. No pleural effusion or pneumothorax. Musculoskeletal: Degenerative changes of the thoracic spine. CT ABDOMEN PELVIS FINDINGS Motion degraded images. Hepatobiliary: Unenhanced liver is unremarkable. Layering small gallstones with in the gallbladder neck (series 2/image 36), without associated inflammatory changes. No intrahepatic or extrahepatic ductal dilatation. Pancreas: Within normal limits. Spleen: Normal in size. Adrenals/Urinary Tract: Adrenal glands are within normal limits. Kidneys are unremarkable. No renal, ureteral, or bladder calculi.  No hydronephrosis. Bladder is within normal limits. Stomach/Bowel: Stomach is within normal limits. No evidence of bowel obstruction. Normal appendix (series 2/image 95). Vascular/Lymphatic: No evidence of abdominal aortic aneurysm. Mildly prominent upper abdominal/retroperitoneal lymph nodes, new/progressed from 2014, including: --9  mm short axis gastrohepatic node (series 2/ image 51) --10 mm  short axis node in the porta hepatis (series 2/image 23) --9 mm short axis portacaval node (series 2/ image 58) --9 mm short axis left para-aortic node (series 2/image 69) --8 mm short axis aortocaval node (series 2/image 69) Reproductive: Status post hysterectomy. No adnexal masses. Other: No abdominopelvic ascites. Musculoskeletal: Mild degenerative changes of the lumbar spine. IMPRESSION: Patchy/nodular ground-glass opacities in the posterior right upper lobe and right lower lobe, suspicious for pneumonia, possibly on the basis of aspiration. No evidence of bowel obstruction. Normal appendix. Cholelithiasis, without associated inflammatory changes. No CT findings to account for the patient's left upper quadrant abdominal pain. Dominant 17 mm short axis left supraclavicular node, new, suspicious for lymphomatous recurrence given the patient's history. Additional mildly prominent lymph nodes in the chest, abdomen, and pelvis, as above. Electronically Signed   By: Julian Hy M.D.   On: 10/31/2014 23:03   Ct Head Wo Contrast  10/31/2014  CLINICAL DATA:  Altered mental status.  Apparent fall EXAM: CT HEAD WITHOUT CONTRAST CT CERVICAL SPINE WITHOUT CONTRAST TECHNIQUE: Multidetector CT imaging of the head and cervical spine was performed following the standard protocol without intravenous contrast. Multiplanar CT image reconstructions of the cervical spine were also generated. COMPARISON:  Head CT October 29, 2014 FINDINGS: CT HEAD FINDINGS Moderate diffuse atrophy is stable. There is no intracranial mass, hemorrhage, extra-axial fluid collection, or midline shift. Patchy small vessel disease in the centra semiovale bilaterally is stable. There is no new gray-white compartment lesion. No acute infarct. Bony calvarium appears intact. The mastoid air cells are clear. CT CERVICAL SPINE FINDINGS There is no fracture or appreciable spondylolisthesis. Prevertebral soft tissues and predental space regions are  normal. There is moderate disc space narrowing at C5-6, C6-7, and C7-T1. There is facet hypertrophy at multiple levels bilaterally, most pronounced at C5-6 on the left. No frank disc extrusion or high-grade stenosis. IMPRESSION: CT head: Atrophy with mild periventricular small vessel disease. No intracranial mass, hemorrhage, or acute appearing infarct. CT cervical spine: Multilevel osteoarthritic change. No demonstrable fracture or spondylolisthesis. Electronically Signed   By: Lowella Grip III M.D.   On: 10/31/2014 22:50   Ct Chest Wo Contrast  10/31/2014  CLINICAL DATA:  Found down, left upper quadrant abdominal pain, history of non-Hodgkin's lymphoma EXAM: CT CHEST, ABDOMEN AND PELVIS WITHOUT CONTRAST TECHNIQUE: Multidetector CT imaging of the chest, abdomen and pelvis was performed following the standard protocol without IV contrast. COMPARISON:  02/23/2012 FINDINGS: CT CHEST FINDINGS Mediastinum/Nodes: The heart is top-normal in size. No pericardial effusion. Coronary atherosclerosis. Atherosclerotic calcifications of the aortic arch. Dominant 17 mm short axis left supraclavicular node, new, suspicious for lymphomatous recurrence given the patient's clinical history. Additional prominent thoracic lymph nodes, new/progressed from 2014, including: --12 mm short axis left supraclavicular node (series 2/ image 12) --6 mm short axis prevascular node (series 2/ image 20) --8 mm short axis AP window node (series 2/ image 22) --8 mm short axis subcarinal node (series 2/ image 26) Visualized thyroid is unremarkable. Lungs/Pleura: Evaluation of the lung parenchyma is constrained by respiratory motion. Patchy/nodular ground-glass opacities in the posterior right upper lobe and right lower lobe, suspicious for pneumonia, possibly on the basis of aspiration. Minimal dependent atelectasis at the lung bases, likely atelectasis. Underlying mild centrilobular and paraseptal emphysematous changes. No pleural effusion  or pneumothorax. Musculoskeletal: Degenerative changes of the thoracic spine. CT ABDOMEN PELVIS FINDINGS Motion degraded images. Hepatobiliary: Unenhanced liver is unremarkable. Layering  small gallstones with in the gallbladder neck (series 2/image 36), without associated inflammatory changes. No intrahepatic or extrahepatic ductal dilatation. Pancreas: Within normal limits. Spleen: Normal in size. Adrenals/Urinary Tract: Adrenal glands are within normal limits. Kidneys are unremarkable. No renal, ureteral, or bladder calculi.  No hydronephrosis. Bladder is within normal limits. Stomach/Bowel: Stomach is within normal limits. No evidence of bowel obstruction. Normal appendix (series 2/image 95). Vascular/Lymphatic: No evidence of abdominal aortic aneurysm. Mildly prominent upper abdominal/retroperitoneal lymph nodes, new/progressed from 2014, including: --9 mm short axis gastrohepatic node (series 2/ image 51) --10 mm short axis node in the porta hepatis (series 2/image 23) --9 mm short axis portacaval node (series 2/ image 58) --9 mm short axis left para-aortic node (series 2/image 69) --8 mm short axis aortocaval node (series 2/image 69) Reproductive: Status post hysterectomy. No adnexal masses. Other: No abdominopelvic ascites. Musculoskeletal: Mild degenerative changes of the lumbar spine. IMPRESSION: Patchy/nodular ground-glass opacities in the posterior right upper lobe and right lower lobe, suspicious for pneumonia, possibly on the basis of aspiration. No evidence of bowel obstruction. Normal appendix. Cholelithiasis, without associated inflammatory changes. No CT findings to account for the patient's left upper quadrant abdominal pain. Dominant 17 mm short axis left supraclavicular node, new, suspicious for lymphomatous recurrence given the patient's history. Additional mildly prominent lymph nodes in the chest, abdomen, and pelvis, as above. Electronically Signed   By: Julian Hy M.D.   On:  10/31/2014 23:03   Ct Cervical Spine Wo Contrast  10/31/2014  CLINICAL DATA:  Altered mental status.  Apparent fall EXAM: CT HEAD WITHOUT CONTRAST CT CERVICAL SPINE WITHOUT CONTRAST TECHNIQUE: Multidetector CT imaging of the head and cervical spine was performed following the standard protocol without intravenous contrast. Multiplanar CT image reconstructions of the cervical spine were also generated. COMPARISON:  Head CT October 29, 2014 FINDINGS: CT HEAD FINDINGS Moderate diffuse atrophy is stable. There is no intracranial mass, hemorrhage, extra-axial fluid collection, or midline shift. Patchy small vessel disease in the centra semiovale bilaterally is stable. There is no new gray-white compartment lesion. No acute infarct. Bony calvarium appears intact. The mastoid air cells are clear. CT CERVICAL SPINE FINDINGS There is no fracture or appreciable spondylolisthesis. Prevertebral soft tissues and predental space regions are normal. There is moderate disc space narrowing at C5-6, C6-7, and C7-T1. There is facet hypertrophy at multiple levels bilaterally, most pronounced at C5-6 on the left. No frank disc extrusion or high-grade stenosis. IMPRESSION: CT head: Atrophy with mild periventricular small vessel disease. No intracranial mass, hemorrhage, or acute appearing infarct. CT cervical spine: Multilevel osteoarthritic change. No demonstrable fracture or spondylolisthesis. Electronically Signed   By: Lowella Grip III M.D.   On: 10/31/2014 22:50    ROS  As stated in the HPI and negative for all other systems.  Physical Exam  Vitals:Blood pressure 138/118, pulse 96, temperature 98.9 F (37.2 C), temperature source Oral, resp. rate 16, height 5\' 4"  (1.626 m), weight 208 lb 8.9 oz (94.6 kg), SpO2 100 %.  Somnolent, withdraws to painful stimuli, does not follow commands, NAD HEENT: Unremarkable Neck:  6 cm JVD, no thyromegally Lymphatics:  No adenopathy Back:  No CVA tenderness Lungs:  Clear  with no wheezes HEART:  Regular rate rhythm, no murmurs, no rubs, no clicks Abd:  soft, positive bowel sounds, no organomegally, no rebound, no guarding Ext:  2 plus pulses, no edema, no cyanosis, no clubbing Skin:  No rashes no nodules Neuro:  CN II through XII intact, motor grossly  intact  Tele - nsr   Assessment/Plan: 1. Encephalopathy 2. Elevated troponin 3. Elevated WBC Rec: at this time her elevated troponin is of no clinical significance. No additional rec's regarding this. I would not use IV heparin as there is no clinical condition to warrant this. Her encephalopathy is either metabolic, infectious or perhaps some central neuro process. An MRI of the head should be considered. Rarely, I have seen SBE present this way although the patient usually has evidence of cerebral emboli. She is not hypoglycemic. A 2D echo is recommended. I would not do any other cardiac workup.   Carleene Overlie TaylorMD 11/01/2014, 2:18 PM

## 2014-11-01 NOTE — Progress Notes (Signed)
LAb reports Gram + Cocci 1 anaerobic bottle in 1 set.

## 2014-11-01 NOTE — Progress Notes (Signed)
[name] NAME: [name]    MR#:  [id number]  DATE OF BIRTH:  23-Apr-1940  SUBJECTIVE:  CHIEF COMPLAINT:   Chief Complaint  Patient presents with  . Altered Mental Status   -Patient is currently lethargic, arousable with deep sternal rub. At times, following some simple commands like squeezing fingers. -Tolerated bedside, prior to recent episodes with her pain medications and this one, patient was very alert and oriented and very conversational. She had limited mobility from her hip pain and was mostly using walker or wheelchair. - MRI is pending  REVIEW OF SYSTEMS:  Review of Systems  Unable to perform ROS: critical illness    DRUG ALLERGIES:   Allergies  Allergen Reactions  . Penicillins Hives    VITALS:  Blood pressure 138/118, pulse 96, temperature 98.9 F (37.2 C), temperature source Oral, resp. rate 16, height 5\' 4"  (1.626 m), weight 94.6 kg (208 lb 8.9 oz), SpO2 100 %.  PHYSICAL EXAMINATION:  Physical Exam  GENERAL:  74 y.o.-year-old patient lying in the bed, very lethargic and critically ill-appearing  EYES: Pupils equal, round, reactive to light and accommodation. No scleral icterus. Extraocular muscles intact.  HEENT: Head atraumatic, normocephalic. Has right facial droop on exam, Oropharynx and nasopharynx clear.  NECK:  Supple, no jugular venous distention. No thyroid enlargement, no tenderness.  LUNGS: Normal breath sounds bilaterally, no wheezing, rales,rhonchi or crepitation. No use of accessory muscles of respiration. Decreased bibasilar breath sounds CARDIOVASCULAR: S1, S2 normal. No rubs, or gallops. 3/6 systolic murmur present ABDOMEN: Soft, nontender, nondistended. Bowel sounds present. No organomegaly or mass.  EXTREMITIES: No pedal edema, cyanosis, or clubbing.  NEUROLOGIC: Unable to do a complete neuro exam due to patient being lethargic. She has obvious right facial droop and when  trying to talk her speech is very slurred. Trying to follow simple commands her left hand grip seems slightly weaker but able to move her left leg better. However this is not consistent because she is lethargic and intermittently following commands. PSYCHIATRIC: The patient is lethargic and barely arousable.  SKIN: No obvious rash, lesion, or ulcer.    LABORATORY PANEL:   CBC  Recent Labs Lab 11/01/14 0248  WBC 16.3*  HGB 12.0  HCT 35.3  PLT 134*   ------------------------------------------------------------------------------------------------------------------  Chemistries   Recent Labs Lab 10/31/14 2144 11/01/14 0248  NA 135 137  K 3.5 3.4*  CL 97* 99*  CO2 29 27  GLUCOSE 119* 83  BUN 53* 55*  CREATININE 2.36* 2.12*  CALCIUM 9.2 9.0  AST 41  --   ALT 18  --   ALKPHOS 67  --   BILITOT 1.2  --    ------------------------------------------------------------------------------------------------------------------  Cardiac Enzymes  Recent Labs Lab 11/01/14 0844  TROPONINI 0.48*   ------------------------------------------------------------------------------------------------------------------  RADIOLOGY:  Ct Abdomen Pelvis Wo Contrast  10/31/2014  CLINICAL DATA:  Found down, left upper quadrant abdominal pain, history of non-Hodgkin's lymphoma EXAM: CT CHEST, ABDOMEN AND PELVIS WITHOUT CONTRAST TECHNIQUE: Multidetector CT imaging of the chest, abdomen and pelvis was performed following the standard protocol without IV contrast. COMPARISON:  02/23/2012 FINDINGS: CT CHEST FINDINGS Mediastinum/Nodes: The heart is top-normal in size. No pericardial effusion. Coronary atherosclerosis. Atherosclerotic calcifications of the aortic arch. Dominant 17 mm short axis left supraclavicular node, new, suspicious for lymphomatous recurrence given the patient's clinical history. Additional prominent thoracic lymph nodes, new/progressed from 2014, including: --12 mm short axis left  supraclavicular node (series 2/ image 12) --  6 mm short axis prevascular node (series 2/ image 20) --8 mm short axis AP window node (series 2/ image 22) --8 mm short axis subcarinal node (series 2/ image 26) Visualized thyroid is unremarkable. Lungs/Pleura: Evaluation of the lung parenchyma is constrained by respiratory motion. Patchy/nodular ground-glass opacities in the posterior right upper lobe and right lower lobe, suspicious for pneumonia, possibly on the basis of aspiration. Minimal dependent atelectasis at the lung bases, likely atelectasis. Underlying mild centrilobular and paraseptal emphysematous changes. No pleural effusion or pneumothorax. Musculoskeletal: Degenerative changes of the thoracic spine. CT ABDOMEN PELVIS FINDINGS Motion degraded images. Hepatobiliary: Unenhanced liver is unremarkable. Layering small gallstones with in the gallbladder neck (series 2/image 36), without associated inflammatory changes. No intrahepatic or extrahepatic ductal dilatation. Pancreas: Within normal limits. Spleen: Normal in size. Adrenals/Urinary Tract: Adrenal glands are within normal limits. Kidneys are unremarkable. No renal, ureteral, or bladder calculi.  No hydronephrosis. Bladder is within normal limits. Stomach/Bowel: Stomach is within normal limits. No evidence of bowel obstruction. Normal appendix (series 2/image 95). Vascular/Lymphatic: No evidence of abdominal aortic aneurysm. Mildly prominent upper abdominal/retroperitoneal lymph nodes, new/progressed from 2014, including: --9 mm short axis gastrohepatic node (series 2/ image 51) --10 mm short axis node in the porta hepatis (series 2/image 23) --9 mm short axis portacaval node (series 2/ image 58) --9 mm short axis left para-aortic node (series 2/image 69) --8 mm short axis aortocaval node (series 2/image 69) Reproductive: Status post hysterectomy. No adnexal masses. Other: No abdominopelvic ascites. Musculoskeletal: Mild degenerative changes of the  lumbar spine. IMPRESSION: Patchy/nodular ground-glass opacities in the posterior right upper lobe and right lower lobe, suspicious for pneumonia, possibly on the basis of aspiration. No evidence of bowel obstruction. Normal appendix. Cholelithiasis, without associated inflammatory changes. No CT findings to account for the patient's left upper quadrant abdominal pain. Dominant 17 mm short axis left supraclavicular node, new, suspicious for lymphomatous recurrence given the patient's history. Additional mildly prominent lymph nodes in the chest, abdomen, and pelvis, as above. Electronically Signed   By: Julian Hy M.D.   On: 10/31/2014 23:03   Ct Head Wo Contrast  10/31/2014  CLINICAL DATA:  Altered mental status.  Apparent fall EXAM: CT HEAD WITHOUT CONTRAST CT CERVICAL SPINE WITHOUT CONTRAST TECHNIQUE: Multidetector CT imaging of the head and cervical spine was performed following the standard protocol without intravenous contrast. Multiplanar CT image reconstructions of the cervical spine were also generated. COMPARISON:  Head CT October 29, 2014 FINDINGS: CT HEAD FINDINGS Moderate diffuse atrophy is stable. There is no intracranial mass, hemorrhage, extra-axial fluid collection, or midline shift. Patchy small vessel disease in the centra semiovale bilaterally is stable. There is no new gray-white compartment lesion. No acute infarct. Bony calvarium appears intact. The mastoid air cells are clear. CT CERVICAL SPINE FINDINGS There is no fracture or appreciable spondylolisthesis. Prevertebral soft tissues and predental space regions are normal. There is moderate disc space narrowing at C5-6, C6-7, and C7-T1. There is facet hypertrophy at multiple levels bilaterally, most pronounced at C5-6 on the left. No frank disc extrusion or high-grade stenosis. IMPRESSION: CT head: Atrophy with mild periventricular small vessel disease. No intracranial mass, hemorrhage, or acute appearing infarct. CT cervical spine:  Multilevel osteoarthritic change. No demonstrable fracture or spondylolisthesis. Electronically Signed   By: Lowella Grip III M.D.   On: 10/31/2014 22:50   Ct Chest Wo Contrast  10/31/2014  CLINICAL DATA:  Found down, left upper quadrant abdominal pain, history of non-Hodgkin's lymphoma EXAM: CT CHEST, ABDOMEN  AND PELVIS WITHOUT CONTRAST TECHNIQUE: Multidetector CT imaging of the chest, abdomen and pelvis was performed following the standard protocol without IV contrast. COMPARISON:  02/23/2012 FINDINGS: CT CHEST FINDINGS Mediastinum/Nodes: The heart is top-normal in size. No pericardial effusion. Coronary atherosclerosis. Atherosclerotic calcifications of the aortic arch. Dominant 17 mm short axis left supraclavicular node, new, suspicious for lymphomatous recurrence given the patient's clinical history. Additional prominent thoracic lymph nodes, new/progressed from 2014, including: --12 mm short axis left supraclavicular node (series 2/ image 12) --6 mm short axis prevascular node (series 2/ image 20) --8 mm short axis AP window node (series 2/ image 22) --8 mm short axis subcarinal node (series 2/ image 26) Visualized thyroid is unremarkable. Lungs/Pleura: Evaluation of the lung parenchyma is constrained by respiratory motion. Patchy/nodular ground-glass opacities in the posterior right upper lobe and right lower lobe, suspicious for pneumonia, possibly on the basis of aspiration. Minimal dependent atelectasis at the lung bases, likely atelectasis. Underlying mild centrilobular and paraseptal emphysematous changes. No pleural effusion or pneumothorax. Musculoskeletal: Degenerative changes of the thoracic spine. CT ABDOMEN PELVIS FINDINGS Motion degraded images. Hepatobiliary: Unenhanced liver is unremarkable. Layering small gallstones with in the gallbladder neck (series 2/image 36), without associated inflammatory changes. No intrahepatic or extrahepatic ductal dilatation. Pancreas: Within normal  limits. Spleen: Normal in size. Adrenals/Urinary Tract: Adrenal glands are within normal limits. Kidneys are unremarkable. No renal, ureteral, or bladder calculi.  No hydronephrosis. Bladder is within normal limits. Stomach/Bowel: Stomach is within normal limits. No evidence of bowel obstruction. Normal appendix (series 2/image 95). Vascular/Lymphatic: No evidence of abdominal aortic aneurysm. Mildly prominent upper abdominal/retroperitoneal lymph nodes, new/progressed from 2014, including: --9 mm short axis gastrohepatic node (series 2/ image 51) --10 mm short axis node in the porta hepatis (series 2/image 23) --9 mm short axis portacaval node (series 2/ image 58) --9 mm short axis left para-aortic node (series 2/image 69) --8 mm short axis aortocaval node (series 2/image 69) Reproductive: Status post hysterectomy. No adnexal masses. Other: No abdominopelvic ascites. Musculoskeletal: Mild degenerative changes of the lumbar spine. IMPRESSION: Patchy/nodular ground-glass opacities in the posterior right upper lobe and right lower lobe, suspicious for pneumonia, possibly on the basis of aspiration. No evidence of bowel obstruction. Normal appendix. Cholelithiasis, without associated inflammatory changes. No CT findings to account for the patient's left upper quadrant abdominal pain. Dominant 17 mm short axis left supraclavicular node, new, suspicious for lymphomatous recurrence given the patient's history. Additional mildly prominent lymph nodes in the chest, abdomen, and pelvis, as above. Electronically Signed   By: Julian Hy M.D.   On: 10/31/2014 23:03   Ct Cervical Spine Wo Contrast  10/31/2014  CLINICAL DATA:  Altered mental status.  Apparent fall EXAM: CT HEAD WITHOUT CONTRAST CT CERVICAL SPINE WITHOUT CONTRAST TECHNIQUE: Multidetector CT imaging of the head and cervical spine was performed following the standard protocol without intravenous contrast. Multiplanar CT image reconstructions of the  cervical spine were also generated. COMPARISON:  Head CT October 29, 2014 FINDINGS: CT HEAD FINDINGS Moderate diffuse atrophy is stable. There is no intracranial mass, hemorrhage, extra-axial fluid collection, or midline shift. Patchy small vessel disease in the centra semiovale bilaterally is stable. There is no new gray-white compartment lesion. No acute infarct. Bony calvarium appears intact. The mastoid air cells are clear. CT CERVICAL SPINE FINDINGS There is no fracture or appreciable spondylolisthesis. Prevertebral soft tissues and predental space regions are normal. There is moderate disc space narrowing at C5-6, C6-7, and C7-T1. There is facet hypertrophy at multiple  levels bilaterally, most pronounced at C5-6 on the left. No frank disc extrusion or high-grade stenosis. IMPRESSION: CT head: Atrophy with mild periventricular small vessel disease. No intracranial mass, hemorrhage, or acute appearing infarct. CT cervical spine: Multilevel osteoarthritic change. No demonstrable fracture or spondylolisthesis. Electronically Signed   By: Lowella Grip III M.D.   On: 10/31/2014 22:50    EKG:   Orders placed or performed during the hospital encounter of 10/31/14  . ED EKG  . ED EKG    ASSESSMENT AND PLAN:   74 year old African-American female with past medical history significant for diabetes, arthritis, prior history of TIAs, coronary artery disease, hypertension presents to the hospital secondary to a sudden change in mental status.   #1 altered mental status-likely acute stroke based on clinical exam. -CT of the head showing moderate atrophy and no acute abnormalities. Patient does have chronic small vessel ischemic changes noted. Efren MRI and MRA of the brain ordered. -Also follow up on carotid Dopplers, echo and also lipid profile. -Neurology consulted. Continue neuro checks frequently - Rule out other metabolic causes as well. Does have aspiration pneumonia-likely from her  stroke. -Physical therapy, speech therapy are pending at this time. -She had altered mental status last week from pain medications, however urine tox is negative and daughter hasn't been giving her any pain medications at all. -Aspirin being given rectally  #2 Aspiration pneumonia- confirmed by CT chest - likely from CVA or aspirated after went unresponsive - f/u blood cultures - cont meropenem for now  #3 DM-maintenance nothing by mouth. -Follow Hba1c. On sliding scale insulin -Patient was on Lantus at home- was hypoglycemic on presentation  #4 HTN - permissive HTN allowed, BP on lower side this morning MAP maintained. Monitor for now  #5 Elevated troponins-could be demand ischemia. -However they're trending up. EKG with no acute findings -Cardiology consulted. Unless stroke is ruled out cannot start on anticoagulation -Continue aspirin for now  #6 ARF on CKD stage 3- baseline cr at 1.7, now at 2.1 - likely ATN - kidneys are unremarkable on CT abd - monitor for now, IV fluids  #5  DVT Prophylaxis- SQ heparin    All the records are reviewed and case discussed with Care Management/Social Workerr. Management plans discussed with the patient, family and they are in agreement.  CODE STATUS: FULL CODE Discussed with patient's daughter at bedside  TOTAL CRITICAL CARE TIME SPENT IN TAKING CARE OF THIS PATIENT: 40 minutes.    Julina Altmann M.D on 11/01/2014 at 12:29 PM  Between 7am to 6pm - Pager - [phone]  After 6pm go to [email or web address] - password [email or web address] [facility]  Office  [phone]  CC: Primary care physician; Pcp Not In System

## 2014-11-01 NOTE — H&P (Signed)
[facility] NAME: [name]    MR#:  [id number]  DATE OF BIRTH:  08/22/1940  DATE OF ADMISSION:  10/31/2014  PRIMARY CARE PHYSICIAN: Pcp Not In System   REQUESTING/REFERRING PHYSICIAN: Cinda Quest, M.D.  CHIEF COMPLAINT:   Chief Complaint  Patient presents with  . Altered Mental Status    HISTORY OF PRESENT ILLNESS:  [name]  is a 74 y.o. female who presents with acute encephalopathy. Patient is altered and unable to participate in history taking process. Information is given by her daughter who is present with her at bedside in the ED. Her daughter states that her mother was recently brought to the emergency department for evaluation for sciatic pain. At that point she was given a prescription for a long-acting oxycodone. Her daughter states that this was a couple of weeks prior to admission tonight. She states that she initially did well with oxycodone, but then was given a prescription for tramadol by her outpatient physician. Daughter states that her mother did not tolerate the tramadol while, and was sleeping all the time. At one point they brought her in for evaluation because she was difficult to arouse. During her evaluation she was given Narcan and woke up and seemed to respond well, and so it was presumed at that time that it was the tramadol that caused her altered mental state after that, the daughter states that she did not give her any further narcotic or narcotic derived medications for her pain. On the day of admission she states that her mother was resting in the morning after having been something closer to her usual self the night before. Her daughter did not wake her up that morning, but when she arrived home from work she was told by her onto being cared for her mother that her mother had not woken up all day. The daughter went to see her mother and found her "foaming at the mouth" and with what seemed to be a  facial droop. She was noted to have a low blood sugar in the 20s. At this point she brought her to the ED for evaluation. Patient here is nonverbal, but does respond to some verbal stimuli, although not purposefully. She does have a facial droop concerning for possible stroke, and does have a history in the past of mini strokes. CT scan also showed what is likely an aspiration pneumonia. Patient's white blood cell count is elevated and she was tachycardic on arrival. Creatinine is also significantly elevated from her baseline. Hospitalists were called for admission for evaluation and management of all of the above.  PAST MEDICAL HISTORY:   Past Medical History  Diagnosis Date  . Diabetes mellitus without complication ([place])   . Arthritis   . Non Hodgkin's lymphoma ([place]) 2009    s/p stem cell transplant  . Hypertension   . MI (myocardial infarction) ([place])     1990s  . TIA (transient ischemic attack)   . Spinal stenosis   . HLD (hyperlipidemia)   . GERD (gastroesophageal reflux disease)     PAST SURGICAL HISTORY:   Past Surgical History  Procedure Laterality Date  . Abdominal hysterectomy    . Toe amputation Left   . Eye surgery Bilateral     SOCIAL HISTORY:   Social History  Substance Use Topics  . Smoking status: Former Research scientist (life sciences)  . Smokeless tobacco: Not on file  . Alcohol Use: No    FAMILY HISTORY:  Family History  Problem Relation Age of Onset  . Hypertension Mother   . Heart disease Mother   . Diabetes Father   . Breast cancer Sister     DRUG ALLERGIES:   Allergies  Allergen Reactions  . Penicillins Hives    MEDICATIONS AT HOME:   Prior to Admission medications   Medication Sig Start Date End Date Taking? Authorizing Provider  acetaminophen (TYLENOL) 500 MG tablet Take 500 mg by mouth every 8 (eight) hours as needed.   Yes Historical Provider, MD  amLODipine (NORVASC) 5 MG tablet Take 5 mg by mouth daily.   Yes Historical Provider, MD  aspirin EC 81 MG  tablet Take 81 mg by mouth daily.   Yes Historical Provider, MD  atorvastatin (LIPITOR) 80 MG tablet Take 80 mg by mouth daily.   Yes Historical Provider, MD  brimonidine-timolol (COMBIGAN) 0.2-0.5 % ophthalmic solution Place 1 drop into both eyes every 12 (twelve) hours.   Yes Historical Provider, MD  Carboxymethylcellul-Glycerin (OPTIVE) 0.5-0.9 % SOLN Apply 1-2 drops to eye 3 (three) times daily as needed.   Yes Historical Provider, MD  chlorthalidone (HYGROTON) 25 MG tablet Take 25 mg by mouth daily.   Yes Historical Provider, MD  citalopram (CELEXA) 20 MG tablet Take 20 mg by mouth daily.   Yes Historical Provider, MD  docusate sodium (COLACE) 100 MG capsule Take 100 mg by mouth at bedtime as needed for mild constipation.   Yes Historical Provider, MD  gabapentin (NEURONTIN) 300 MG capsule Take 300 mg by mouth.   Yes Historical Provider, MD  Insulin Glargine (LANTUS SOLOSTAR) 100 UNIT/ML Solostar Pen Inject 60 Units into the skin daily.   Yes Historical Provider, MD  lisinopril (PRINIVIL,ZESTRIL) 40 MG tablet Take 40 mg by mouth daily.   Yes Historical Provider, MD  naproxen (NAPROSYN) 500 MG tablet Take 500 mg by mouth every morning.   Yes Historical Provider, MD  omeprazole (PRILOSEC) 20 MG capsule Take 20 mg by mouth daily.   Yes Historical Provider, MD  oxyCODONE (ROXICODONE) 5 MG immediate release tablet Take 1 tablet (5 mg total) by mouth every 8 (eight) hours as needed. 10/18/14 10/18/15 Yes Nance Pear, MD  polyethylene glycol (MIRALAX / GLYCOLAX) packet Take 17 g by mouth daily as needed.   Yes Historical Provider, MD  Travoprost, BAK Free, (TRAVATAN) 0.004 % SOLN ophthalmic solution Place 1 drop into both eyes.   Yes Historical Provider, MD  trolamine salicylate (ASPERCREME) 10 % cream Apply 1 application topically as needed for muscle pain (left hip).   Yes Historical Provider, MD  Vitamin D, Ergocalciferol, (DRISDOL) 50000 UNITS CAPS capsule Take 50,000 Units by mouth every 30  (thirty) days.   Yes Historical Provider, MD  Zinc Oxide (DESITIN) 13 % CREA Apply topically.   Yes Historical Provider, MD    REVIEW OF SYSTEMS:  Review of Systems  Unable to perform ROS: acuity of condition     VITAL SIGNS:   Filed Vitals:   10/31/14 2300 10/31/14 2330 10/31/14 2345 11/01/14 0000  BP: 131/89 155/88 127/79 135/72  Pulse: 106 108 106 102  Temp:      TempSrc:      Resp: 25 18 29 20   Height:      Weight:      SpO2: 98% 99% 99% 96%   Wt Readings from Last 3 Encounters:  10/31/14 106.3 kg (234 lb 5.6 oz)  10/29/14 103.42 kg (228 lb)  10/18/14 99.791 kg (220 lb)    PHYSICAL  EXAMINATION:  Physical Exam  Vitals reviewed. Constitutional: She appears well-developed and well-nourished. No distress.  HENT:  Head: Normocephalic and atraumatic.  Mouth/Throat: Oropharynx is clear and moist.  Eyes: Conjunctivae are normal. Pupils are equal, round, and reactive to light. No scleral icterus.  Neck: Normal range of motion. Neck supple. No JVD present. No thyromegaly present.  Cardiovascular: Normal rate, regular rhythm and intact distal pulses.  Exam reveals no gallop and no friction rub.   No murmur heard. Respiratory: Effort normal. No respiratory distress. She has no wheezes. She has rales (some diffuse coarse breath sounds, with over coarse rales focused over the right middle and lower lobes).  GI: Soft. Bowel sounds are normal. She exhibits no distension. There is no tenderness.  Musculoskeletal: Normal range of motion. She exhibits no edema.  No arthritis, no gout  Lymphadenopathy:    She has no cervical adenopathy.  Neurological: A cranial nerve deficit is present.  Unable to fully assess as the patient is not following commands. However, she does seem to have a right facial droop. She spontaneously moves all extremities, though not purposefully. She does respond to pain, and does try try to open her eyes to some verbal cues, but is herself nonverbal at this time.   Skin: Skin is warm and dry. No rash noted. No erythema.  Psychiatric:  Unable to assess due to patient condition    LABORATORY PANEL:   CBC  Recent Labs Lab 10/31/14 2144  WBC 18.2*  HGB 12.7  HCT 37.9  PLT 146*   ------------------------------------------------------------------------------------------------------------------  Chemistries   Recent Labs Lab 10/31/14 2144  NA 135  K 3.5  CL 97*  CO2 29  GLUCOSE 119*  BUN 53*  CREATININE 2.36*  CALCIUM 9.2  AST 41  ALT 18  ALKPHOS 67  BILITOT 1.2   ------------------------------------------------------------------------------------------------------------------  Cardiac Enzymes  Recent Labs Lab 10/31/14 2144  TROPONINI 0.24*   ------------------------------------------------------------------------------------------------------------------  RADIOLOGY:  Ct Abdomen Pelvis Wo Contrast  10/31/2014  CLINICAL DATA:  Found down, left upper quadrant abdominal pain, history of non-Hodgkin's lymphoma EXAM: CT CHEST, ABDOMEN AND PELVIS WITHOUT CONTRAST TECHNIQUE: Multidetector CT imaging of the chest, abdomen and pelvis was performed following the standard protocol without IV contrast. COMPARISON:  02/23/2012 FINDINGS: CT CHEST FINDINGS Mediastinum/Nodes: The heart is top-normal in size. No pericardial effusion. Coronary atherosclerosis. Atherosclerotic calcifications of the aortic arch. Dominant 17 mm short axis left supraclavicular node, new, suspicious for lymphomatous recurrence given the patient's clinical history. Additional prominent thoracic lymph nodes, new/progressed from 2014, including: --12 mm short axis left supraclavicular node (series 2/ image 12) --6 mm short axis prevascular node (series 2/ image 20) --8 mm short axis AP window node (series 2/ image 22) --8 mm short axis subcarinal node (series 2/ image 26) Visualized thyroid is unremarkable. Lungs/Pleura: Evaluation of the lung parenchyma is constrained by  respiratory motion. Patchy/nodular ground-glass opacities in the posterior right upper lobe and right lower lobe, suspicious for pneumonia, possibly on the basis of aspiration. Minimal dependent atelectasis at the lung bases, likely atelectasis. Underlying mild centrilobular and paraseptal emphysematous changes. No pleural effusion or pneumothorax. Musculoskeletal: Degenerative changes of the thoracic spine. CT ABDOMEN PELVIS FINDINGS Motion degraded images. Hepatobiliary: Unenhanced liver is unremarkable. Layering small gallstones with in the gallbladder neck (series 2/image 36), without associated inflammatory changes. No intrahepatic or extrahepatic ductal dilatation. Pancreas: Within normal limits. Spleen: Normal in size. Adrenals/Urinary Tract: Adrenal glands are within normal limits. Kidneys are unremarkable. No renal, ureteral, or bladder calculi.  No hydronephrosis. Bladder is within normal limits. Stomach/Bowel: Stomach is within normal limits. No evidence of bowel obstruction. Normal appendix (series 2/image 95). Vascular/Lymphatic: No evidence of abdominal aortic aneurysm. Mildly prominent upper abdominal/retroperitoneal lymph nodes, new/progressed from 2014, including: --9 mm short axis gastrohepatic node (series 2/ image 51) --10 mm short axis node in the porta hepatis (series 2/image 23) --9 mm short axis portacaval node (series 2/ image 58) --9 mm short axis left para-aortic node (series 2/image 69) --8 mm short axis aortocaval node (series 2/image 69) Reproductive: Status post hysterectomy. No adnexal masses. Other: No abdominopelvic ascites. Musculoskeletal: Mild degenerative changes of the lumbar spine. IMPRESSION: Patchy/nodular ground-glass opacities in the posterior right upper lobe and right lower lobe, suspicious for pneumonia, possibly on the basis of aspiration. No evidence of bowel obstruction. Normal appendix. Cholelithiasis, without associated inflammatory changes. No CT findings to  account for the patient's left upper quadrant abdominal pain. Dominant 17 mm short axis left supraclavicular node, new, suspicious for lymphomatous recurrence given the patient's history. Additional mildly prominent lymph nodes in the chest, abdomen, and pelvis, as above. Electronically Signed   By: Julian Hy M.D.   On: 10/31/2014 23:03   Ct Head Wo Contrast  10/31/2014  CLINICAL DATA:  Altered mental status.  Apparent fall EXAM: CT HEAD WITHOUT CONTRAST CT CERVICAL SPINE WITHOUT CONTRAST TECHNIQUE: Multidetector CT imaging of the head and cervical spine was performed following the standard protocol without intravenous contrast. Multiplanar CT image reconstructions of the cervical spine were also generated. COMPARISON:  Head CT October 29, 2014 FINDINGS: CT HEAD FINDINGS Moderate diffuse atrophy is stable. There is no intracranial mass, hemorrhage, extra-axial fluid collection, or midline shift. Patchy small vessel disease in the centra semiovale bilaterally is stable. There is no new gray-white compartment lesion. No acute infarct. Bony calvarium appears intact. The mastoid air cells are clear. CT CERVICAL SPINE FINDINGS There is no fracture or appreciable spondylolisthesis. Prevertebral soft tissues and predental space regions are normal. There is moderate disc space narrowing at C5-6, C6-7, and C7-T1. There is facet hypertrophy at multiple levels bilaterally, most pronounced at C5-6 on the left. No frank disc extrusion or high-grade stenosis. IMPRESSION: CT head: Atrophy with mild periventricular small vessel disease. No intracranial mass, hemorrhage, or acute appearing infarct. CT cervical spine: Multilevel osteoarthritic change. No demonstrable fracture or spondylolisthesis. Electronically Signed   By: Lowella Grip III M.D.   On: 10/31/2014 22:50   Ct Chest Wo Contrast  10/31/2014  CLINICAL DATA:  Found down, left upper quadrant abdominal pain, history of non-Hodgkin's lymphoma EXAM: CT  CHEST, ABDOMEN AND PELVIS WITHOUT CONTRAST TECHNIQUE: Multidetector CT imaging of the chest, abdomen and pelvis was performed following the standard protocol without IV contrast. COMPARISON:  02/23/2012 FINDINGS: CT CHEST FINDINGS Mediastinum/Nodes: The heart is top-normal in size. No pericardial effusion. Coronary atherosclerosis. Atherosclerotic calcifications of the aortic arch. Dominant 17 mm short axis left supraclavicular node, new, suspicious for lymphomatous recurrence given the patient's clinical history. Additional prominent thoracic lymph nodes, new/progressed from 2014, including: --12 mm short axis left supraclavicular node (series 2/ image 12) --6 mm short axis prevascular node (series 2/ image 20) --8 mm short axis AP window node (series 2/ image 22) --8 mm short axis subcarinal node (series 2/ image 26) Visualized thyroid is unremarkable. Lungs/Pleura: Evaluation of the lung parenchyma is constrained by respiratory motion. Patchy/nodular ground-glass opacities in the posterior right upper lobe and right lower lobe, suspicious for pneumonia, possibly on the basis of aspiration. Minimal  dependent atelectasis at the lung bases, likely atelectasis. Underlying mild centrilobular and paraseptal emphysematous changes. No pleural effusion or pneumothorax. Musculoskeletal: Degenerative changes of the thoracic spine. CT ABDOMEN PELVIS FINDINGS Motion degraded images. Hepatobiliary: Unenhanced liver is unremarkable. Layering small gallstones with in the gallbladder neck (series 2/image 36), without associated inflammatory changes. No intrahepatic or extrahepatic ductal dilatation. Pancreas: Within normal limits. Spleen: Normal in size. Adrenals/Urinary Tract: Adrenal glands are within normal limits. Kidneys are unremarkable. No renal, ureteral, or bladder calculi.  No hydronephrosis. Bladder is within normal limits. Stomach/Bowel: Stomach is within normal limits. No evidence of bowel obstruction. Normal  appendix (series 2/image 95). Vascular/Lymphatic: No evidence of abdominal aortic aneurysm. Mildly prominent upper abdominal/retroperitoneal lymph nodes, new/progressed from 2014, including: --9 mm short axis gastrohepatic node (series 2/ image 51) --10 mm short axis node in the porta hepatis (series 2/image 23) --9 mm short axis portacaval node (series 2/ image 58) --9 mm short axis left para-aortic node (series 2/image 69) --8 mm short axis aortocaval node (series 2/image 69) Reproductive: Status post hysterectomy. No adnexal masses. Other: No abdominopelvic ascites. Musculoskeletal: Mild degenerative changes of the lumbar spine. IMPRESSION: Patchy/nodular ground-glass opacities in the posterior right upper lobe and right lower lobe, suspicious for pneumonia, possibly on the basis of aspiration. No evidence of bowel obstruction. Normal appendix. Cholelithiasis, without associated inflammatory changes. No CT findings to account for the patient's left upper quadrant abdominal pain. Dominant 17 mm short axis left supraclavicular node, new, suspicious for lymphomatous recurrence given the patient's history. Additional mildly prominent lymph nodes in the chest, abdomen, and pelvis, as above. Electronically Signed   By: Julian Hy M.D.   On: 10/31/2014 23:03   Ct Cervical Spine Wo Contrast  10/31/2014  CLINICAL DATA:  Altered mental status.  Apparent fall EXAM: CT HEAD WITHOUT CONTRAST CT CERVICAL SPINE WITHOUT CONTRAST TECHNIQUE: Multidetector CT imaging of the head and cervical spine was performed following the standard protocol without intravenous contrast. Multiplanar CT image reconstructions of the cervical spine were also generated. COMPARISON:  Head CT October 29, 2014 FINDINGS: CT HEAD FINDINGS Moderate diffuse atrophy is stable. There is no intracranial mass, hemorrhage, extra-axial fluid collection, or midline shift. Patchy small vessel disease in the centra semiovale bilaterally is stable. There is  no new gray-white compartment lesion. No acute infarct. Bony calvarium appears intact. The mastoid air cells are clear. CT CERVICAL SPINE FINDINGS There is no fracture or appreciable spondylolisthesis. Prevertebral soft tissues and predental space regions are normal. There is moderate disc space narrowing at C5-6, C6-7, and C7-T1. There is facet hypertrophy at multiple levels bilaterally, most pronounced at C5-6 on the left. No frank disc extrusion or high-grade stenosis. IMPRESSION: CT head: Atrophy with mild periventricular small vessel disease. No intracranial mass, hemorrhage, or acute appearing infarct. CT cervical spine: Multilevel osteoarthritic change. No demonstrable fracture or spondylolisthesis. Electronically Signed   By: Lowella Grip III M.D.   On: 10/31/2014 22:50    EKG:   Orders placed or performed during the hospital encounter of 10/31/14  . ED EKG  . ED EKG    IMPRESSION AND PLAN:  Principal Problem:   Acute encephalopathy - strong suspicion for stroke. We'll initiate stroke workup with MRI/MRA and carotid Dopplers, echocardiogram, lipid panel, A1c, serial troponins as the first one drawn was elevated. Neuro consult. Active Problems:   Aspiration pneumonia ([place]) - unclear setting in sequence of events given possible stroke and episode of hypoglycemia all surrounding the likely aspiration event. CT scan  shows aspiration pneumonia, clinical exam is consistent with the same. We'll start IV antibiotics, and send blood cultures and sputum cultures if we are able to collect it.   Hypoglycemia - we'll hold patient's scheduled insulin at this time, initiate sliding scale. Patient is nothing by mouth for now.   Acute kidney injury superimposed on chronic kidney disease ([place]) - unclear etiology of acute injury, however suspect with her recent decrease in by mouth intake, this could all be a prerenal insult. Will hydrate gently, and monitor for improvement. Avoid nephrotoxins.   HTN  (hypertension) - allow permissive hypertension for the first 24 hours to a goal of blood pressure less than 220/110, then controlled to a goal blood pressure less than 160/100.   Type 2 diabetes mellitus ([facility]) - sliding scale insulin as above, hold other medications at this time. Check A1c as above.   CAD (coronary artery disease) - holding medications while she is nothing by mouth, her home medications for this will need to be restarted once she is able to tolerate oral intake again.  All the records are reviewed and case discussed with ED provider. Management plans discussed with the patient and/or family.  DVT PROPHYLAXIS: Subcutaneous heparin  ADMISSION STATUS: Inpatient  CODE STATUS: Full Advance Directive Documentation        Most Recent Value   Type of Advance Directive  Healthcare Power of Attorney   Pre-existing out of facility DNR order (yellow form or pink MOST form)     "MOST" Form in Place?        TOTAL TIME TAKING CARE OF THIS PATIENT: 50 minutes.    [name] 11/01/2014, 12:22 AM  [facility]  Office  [phone]  CC: Primary care physician; Pcp Not In System

## 2014-11-02 ENCOUNTER — Inpatient Hospital Stay: Payer: Medicare (Managed Care)

## 2014-11-02 DIAGNOSIS — I639 Cerebral infarction, unspecified: Secondary | ICD-10-CM | POA: Diagnosis not present

## 2014-11-02 LAB — GLUCOSE, CAPILLARY
GLUCOSE-CAPILLARY: 119 mg/dL — AB (ref 65–99)
GLUCOSE-CAPILLARY: 121 mg/dL — AB (ref 65–99)
GLUCOSE-CAPILLARY: 140 mg/dL — AB (ref 65–99)
GLUCOSE-CAPILLARY: 113 mg/dL — AB (ref 65–99)
GLUCOSE-CAPILLARY: 200 mg/dL — AB (ref 65–99)
Glucose-Capillary: 92 mg/dL (ref 65–99)

## 2014-11-02 MED ORDER — LORAZEPAM 2 MG/ML IJ SOLN
INTRAMUSCULAR | Status: AC
  Administered 2014-11-02: 1 mg
  Filled 2014-11-02: qty 1

## 2014-11-02 NOTE — Progress Notes (Signed)
[facility] NAME: [name]    MR#:  [id number]  DATE OF BIRTH:  April 10, 1940  SUBJECTIVE:  -Patient is currently lethargic, arousable with verbal commands. At times, following some simple commands like squeezing fingers. No issues other than sugar dropped to 65 and started on d5NS REVIEW OF SYSTEMS:  Review of Systems  Unable to perform ROS: critical illness   DRUG ALLERGIES:   Allergies  Allergen Reactions  . Penicillins Hives    VITALS:  Blood pressure 106/64, pulse 77, temperature 98.3 F (36.8 C), temperature source Oral, resp. rate 16, height 5\' 4"  (1.626 m), weight 94.6 kg (208 lb 8.9 oz), SpO2 100 %.  PHYSICAL EXAMINATION:  Physical Exam  GENERAL:  74 y.o.-year-old patient lying in the bed, very lethargic and critically ill-appearing, obese EYES: Pupils equal, round, reactive to light and accommodation. No scleral icterus. Extraocular muscles intact.  HEENT: Head atraumatic, normocephalic. Has right facial droop on exam, Oropharynx and nasopharynx clear.  NECK:  Supple, no jugular venous distention. No thyroid enlargement, no tenderness.  LUNGS: Normal breath sounds bilaterally, no wheezing, rales,rhonchi or crepitation. No use of accessory muscles of respiration. Decreased bibasilar breath sounds CARDIOVASCULAR: S1, S2 normal. No rubs, or gallops. 3/6 systolic murmur present ABDOMEN: Soft, nontender, nondistended. Bowel sounds present. No organomegaly or mass.  EXTREMITIES: No pedal edema, cyanosis, or clubbing.  NEUROLOGIC: Unable to do a complete neuro exam due to patient being lethargic. She has obvious right facial droop and when trying to talk her speech is very slurred. Trying to follow simple commands her left hand grip seems slightly weaker but able to move her left leg better. However this is not consistent because she is lethargic and intermittently following commands. -garbled speech PSYCHIATRIC:   patient is lethargic  SKIN: No obvious rash, lesion, or ulcer.    LABORATORY PANEL:   CBC  Recent Labs Lab 11/01/14 0248  WBC 16.3*  HGB 12.0  HCT 35.3  PLT 134*   ------------------------------------------------------------------------------------------------------------------  Chemistries   Recent Labs Lab 10/31/14 2144 11/01/14 0248  NA 135 137  K 3.5 3.4*  CL 97* 99*  CO2 29 27  GLUCOSE 119* 83  BUN 53* 55*  CREATININE 2.36* 2.12*  CALCIUM 9.2 9.0  AST 41  --   ALT 18  --   ALKPHOS 67  --   BILITOT 1.2  --    ------------------------------------------------------------------------------------------------------------------  Cardiac Enzymes  Recent Labs Lab 11/01/14 0844  TROPONINI 0.48*   ------------------------------------------------------------------------------------------------------------------  RADIOLOGY:  Ct Abdomen Pelvis Wo Contrast  10/31/2014  CLINICAL DATA:  Found down, left upper quadrant abdominal pain, history of non-Hodgkin's lymphoma EXAM: CT CHEST, ABDOMEN AND PELVIS WITHOUT CONTRAST TECHNIQUE: Multidetector CT imaging of the chest, abdomen and pelvis was performed following the standard protocol without IV contrast. COMPARISON:  02/23/2012 FINDINGS: CT CHEST FINDINGS Mediastinum/Nodes: The heart is top-normal in size. No pericardial effusion. Coronary atherosclerosis. Atherosclerotic calcifications of the aortic arch. Dominant 17 mm short axis left supraclavicular node, new, suspicious for lymphomatous recurrence given the patient's clinical history. Additional prominent thoracic lymph nodes, new/progressed from 2014, including: --12 mm short axis left supraclavicular node (series 2/ image 12) --6 mm short axis prevascular node (series 2/ image 20) --8 mm short axis AP window node (series 2/ image 22) --8 mm short axis subcarinal node (series 2/ image 26) Visualized thyroid is unremarkable. Lungs/Pleura: Evaluation of the lung parenchyma is  constrained by respiratory motion. Patchy/nodular ground-glass opacities in  the posterior right upper lobe and right lower lobe, suspicious for pneumonia, possibly on the basis of aspiration. Minimal dependent atelectasis at the lung bases, likely atelectasis. Underlying mild centrilobular and paraseptal emphysematous changes. No pleural effusion or pneumothorax. Musculoskeletal: Degenerative changes of the thoracic spine. CT ABDOMEN PELVIS FINDINGS Motion degraded images. Hepatobiliary: Unenhanced liver is unremarkable. Layering small gallstones with in the gallbladder neck (series 2/image 36), without associated inflammatory changes. No intrahepatic or extrahepatic ductal dilatation. Pancreas: Within normal limits. Spleen: Normal in size. Adrenals/Urinary Tract: Adrenal glands are within normal limits. Kidneys are unremarkable. No renal, ureteral, or bladder calculi.  No hydronephrosis. Bladder is within normal limits. Stomach/Bowel: Stomach is within normal limits. No evidence of bowel obstruction. Normal appendix (series 2/image 95). Vascular/Lymphatic: No evidence of abdominal aortic aneurysm. Mildly prominent upper abdominal/retroperitoneal lymph nodes, new/progressed from 2014, including: --9 mm short axis gastrohepatic node (series 2/ image 51) --10 mm short axis node in the porta hepatis (series 2/image 23) --9 mm short axis portacaval node (series 2/ image 58) --9 mm short axis left para-aortic node (series 2/image 69) --8 mm short axis aortocaval node (series 2/image 69) Reproductive: Status post hysterectomy. No adnexal masses. Other: No abdominopelvic ascites. Musculoskeletal: Mild degenerative changes of the lumbar spine. IMPRESSION: Patchy/nodular ground-glass opacities in the posterior right upper lobe and right lower lobe, suspicious for pneumonia, possibly on the basis of aspiration. No evidence of bowel obstruction. Normal appendix. Cholelithiasis, without associated inflammatory changes. No CT  findings to account for the patient's left upper quadrant abdominal pain. Dominant 17 mm short axis left supraclavicular node, new, suspicious for lymphomatous recurrence given the patient's history. Additional mildly prominent lymph nodes in the chest, abdomen, and pelvis, as above. Electronically Signed   By: Julian Hy M.D.   On: 10/31/2014 23:03   Ct Head Wo Contrast  10/31/2014  CLINICAL DATA:  Altered mental status.  Apparent fall EXAM: CT HEAD WITHOUT CONTRAST CT CERVICAL SPINE WITHOUT CONTRAST TECHNIQUE: Multidetector CT imaging of the head and cervical spine was performed following the standard protocol without intravenous contrast. Multiplanar CT image reconstructions of the cervical spine were also generated. COMPARISON:  Head CT October 29, 2014 FINDINGS: CT HEAD FINDINGS Moderate diffuse atrophy is stable. There is no intracranial mass, hemorrhage, extra-axial fluid collection, or midline shift. Patchy small vessel disease in the centra semiovale bilaterally is stable. There is no new gray-white compartment lesion. No acute infarct. Bony calvarium appears intact. The mastoid air cells are clear. CT CERVICAL SPINE FINDINGS There is no fracture or appreciable spondylolisthesis. Prevertebral soft tissues and predental space regions are normal. There is moderate disc space narrowing at C5-6, C6-7, and C7-T1. There is facet hypertrophy at multiple levels bilaterally, most pronounced at C5-6 on the left. No frank disc extrusion or high-grade stenosis. IMPRESSION: CT head: Atrophy with mild periventricular small vessel disease. No intracranial mass, hemorrhage, or acute appearing infarct. CT cervical spine: Multilevel osteoarthritic change. No demonstrable fracture or spondylolisthesis. Electronically Signed   By: Lowella Grip III M.D.   On: 10/31/2014 22:50   Ct Chest Wo Contrast  10/31/2014  CLINICAL DATA:  Found down, left upper quadrant abdominal pain, history of non-Hodgkin's lymphoma  EXAM: CT CHEST, ABDOMEN AND PELVIS WITHOUT CONTRAST TECHNIQUE: Multidetector CT imaging of the chest, abdomen and pelvis was performed following the standard protocol without IV contrast. COMPARISON:  02/23/2012 FINDINGS: CT CHEST FINDINGS Mediastinum/Nodes: The heart is top-normal in size. No pericardial effusion. Coronary atherosclerosis. Atherosclerotic calcifications of the aortic arch. Dominant 17  mm short axis left supraclavicular node, new, suspicious for lymphomatous recurrence given the patient's clinical history. Additional prominent thoracic lymph nodes, new/progressed from 2014, including: --12 mm short axis left supraclavicular node (series 2/ image 12) --6 mm short axis prevascular node (series 2/ image 20) --8 mm short axis AP window node (series 2/ image 22) --8 mm short axis subcarinal node (series 2/ image 26) Visualized thyroid is unremarkable. Lungs/Pleura: Evaluation of the lung parenchyma is constrained by respiratory motion. Patchy/nodular ground-glass opacities in the posterior right upper lobe and right lower lobe, suspicious for pneumonia, possibly on the basis of aspiration. Minimal dependent atelectasis at the lung bases, likely atelectasis. Underlying mild centrilobular and paraseptal emphysematous changes. No pleural effusion or pneumothorax. Musculoskeletal: Degenerative changes of the thoracic spine. CT ABDOMEN PELVIS FINDINGS Motion degraded images. Hepatobiliary: Unenhanced liver is unremarkable. Layering small gallstones with in the gallbladder neck (series 2/image 36), without associated inflammatory changes. No intrahepatic or extrahepatic ductal dilatation. Pancreas: Within normal limits. Spleen: Normal in size. Adrenals/Urinary Tract: Adrenal glands are within normal limits. Kidneys are unremarkable. No renal, ureteral, or bladder calculi.  No hydronephrosis. Bladder is within normal limits. Stomach/Bowel: Stomach is within normal limits. No evidence of bowel obstruction.  Normal appendix (series 2/image 95). Vascular/Lymphatic: No evidence of abdominal aortic aneurysm. Mildly prominent upper abdominal/retroperitoneal lymph nodes, new/progressed from 2014, including: --9 mm short axis gastrohepatic node (series 2/ image 51) --10 mm short axis node in the porta hepatis (series 2/image 23) --9 mm short axis portacaval node (series 2/ image 58) --9 mm short axis left para-aortic node (series 2/image 69) --8 mm short axis aortocaval node (series 2/image 69) Reproductive: Status post hysterectomy. No adnexal masses. Other: No abdominopelvic ascites. Musculoskeletal: Mild degenerative changes of the lumbar spine. IMPRESSION: Patchy/nodular ground-glass opacities in the posterior right upper lobe and right lower lobe, suspicious for pneumonia, possibly on the basis of aspiration. No evidence of bowel obstruction. Normal appendix. Cholelithiasis, without associated inflammatory changes. No CT findings to account for the patient's left upper quadrant abdominal pain. Dominant 17 mm short axis left supraclavicular node, new, suspicious for lymphomatous recurrence given the patient's history. Additional mildly prominent lymph nodes in the chest, abdomen, and pelvis, as above. Electronically Signed   By: Julian Hy M.D.   On: 10/31/2014 23:03   Ct Cervical Spine Wo Contrast  10/31/2014  CLINICAL DATA:  Altered mental status.  Apparent fall EXAM: CT HEAD WITHOUT CONTRAST CT CERVICAL SPINE WITHOUT CONTRAST TECHNIQUE: Multidetector CT imaging of the head and cervical spine was performed following the standard protocol without intravenous contrast. Multiplanar CT image reconstructions of the cervical spine were also generated. COMPARISON:  Head CT October 29, 2014 FINDINGS: CT HEAD FINDINGS Moderate diffuse atrophy is stable. There is no intracranial mass, hemorrhage, extra-axial fluid collection, or midline shift. Patchy small vessel disease in the centra semiovale bilaterally is stable.  There is no new gray-white compartment lesion. No acute infarct. Bony calvarium appears intact. The mastoid air cells are clear. CT CERVICAL SPINE FINDINGS There is no fracture or appreciable spondylolisthesis. Prevertebral soft tissues and predental space regions are normal. There is moderate disc space narrowing at C5-6, C6-7, and C7-T1. There is facet hypertrophy at multiple levels bilaterally, most pronounced at C5-6 on the left. No frank disc extrusion or high-grade stenosis. IMPRESSION: CT head: Atrophy with mild periventricular small vessel disease. No intracranial mass, hemorrhage, or acute appearing infarct. CT cervical spine: Multilevel osteoarthritic change. No demonstrable fracture or spondylolisthesis. Electronically Signed   By:  Lowella Grip III M.D.   On: 10/31/2014 22:50    EKG:   Orders placed or performed during the hospital encounter of 10/31/14  . ED EKG  . ED EKG    ASSESSMENT AND PLAN:   74 year old African-American female with past medical history significant for diabetes, arthritis, prior history of TIAs, coronary artery disease, hypertension presents to the hospital secondary to a sudden change in mental status.   #1 altered mental status-likely acute stroke based on clinical exam. -CT of the head showing moderate atrophy and no acute abnormalities. Patient does have chronic small vessel ischemic changes noted.  MRI and MRA of the brain ordered. -Also follow up on carotid Dopplers, echo and also lipid profile. -Neurology consult appreciated. Continue neuro checks frequently - Rule out other metabolic causes as well. Does have aspiration pneumonia-likely from her stroke. -Physical therapy, speech therapy are pending at this time. -She had altered mental status last week from pain medications, however urine tox is negative and daughter hasn't been giving her any pain medications at all. -Aspirin being given rectally  #2 Aspiration pneumonia- confirmed by CT  chest - likely from CVA or aspirated after went unresponsive - 1/2 blood cultures positive for GPC likely contamination - cont meropenem for now  #3 DM-maintenance nothing by mouth.with low sugars since npo -on D5 gtt -. On sliding scale insulin -Patient was on Lantus at home- was hypoglycemic on presentation  #4 HTN - permissive HTN allowed, BP on lower side this morning MAP maintained. Monitor for now  #5 Elevated troponins-could be demand ischemia. -However they're trending up. EKG with no acute findings -Cardiology consult noted. No further w/u recommended at present -Continue aspirin for now  #6 ARF on CKD stage 3- baseline cr at 1.7, now at 2.1 - likely ATN - kidneys are unremarkable on CT abd - monitor for now, IV fluids  #5  DVT Prophylaxis- SQ lovenox   All the records are reviewed and case discussed with Care Management/Social Workerr. Management plans discussed with the patient, family and they are in agreement.  CODE STATUS: FULL CODE  TOTAL CRITICAL CARE TIME SPENT IN TAKING CARE OF THIS PATIENT: 40 minutes.    Jazz Biddy M.D on 11/02/2014 at 6:54 AM  Between 7am to 6pm - Pager - [phone]  After 6pm go to [email or web address] - password [email or web address] [facility]  Office  [phone]  CC: Primary care physician; Pcp Not In System

## 2014-11-02 NOTE — Plan of Care (Signed)
Problem: Discharge/Transitional Outcomes Goal: Other Discharge Outcomes/Goals Outcome: Progressing Plan of care progress to goal for: 1. Pain-no c/o pain, pt resting comfortably 2. Hemodynamically-             -VSS, afebrile              -IVF infusing per orders 3. Complications-no evidence of this shift 4. Diet-pt is NPO 5. Activity-high falls, pt is total care

## 2014-11-02 NOTE — Progress Notes (Signed)
Pt is resting comfortably.  Pt is following commands, will squeeze hands, wiggle toes. Pt is talking however it is garbled speech.  Pt will tell you her name but is confused as to place, time and situation.

## 2014-11-02 NOTE — Progress Notes (Signed)
PT Cancellation Note  Patient Details Name: [name] MRN: [id number] DOB: 07/17/1940   Cancelled Treatment:    Reason Eval/Treat Not Completed:  (nursing reports not a good day for PT)  Nursing was working with pt as PT attempted to do eval.  Reports that she is not being compliant and that PT would be unable to work with her today. Deferred and we will try back when pt is more appropriate.   Wayne Both, PT, DPT [phone]  Kreg Shropshire 11/02/2014, 4:41 PM

## 2014-11-03 ENCOUNTER — Ambulatory Visit: Payer: PRIVATE HEALTH INSURANCE | Admitting: Pain Medicine

## 2014-11-03 LAB — GLUCOSE, CAPILLARY
Glucose-Capillary: 115 mg/dL — ABNORMAL HIGH (ref 65–99)
Glucose-Capillary: 107 mg/dL — ABNORMAL HIGH (ref 65–99)
Glucose-Capillary: 102 mg/dL — ABNORMAL HIGH (ref 65–99)
Glucose-Capillary: 184 mg/dL — ABNORMAL HIGH (ref 65–99)
Glucose-Capillary: 199 mg/dL — ABNORMAL HIGH (ref 65–99)
Glucose-Capillary: 175 mg/dL — ABNORMAL HIGH (ref 65–99)

## 2014-11-03 MED ORDER — CITALOPRAM HYDROBROMIDE 20 MG PO TABS
20.0000 mg | ORAL_TABLET | Freq: Every day | ORAL | Status: DC
  Administered 2014-11-03 – 2014-11-04 (×2): 20 mg via ORAL
  Filled 2014-11-03 (×2): qty 1

## 2014-11-03 MED ORDER — TRAVOPROST (BAK FREE) 0.004 % OP SOLN
1.0000 [drp] | Freq: Every day | OPHTHALMIC | Status: DC
  Filled 2014-11-03: qty 2.5

## 2014-11-03 MED ORDER — DOCUSATE SODIUM 100 MG PO CAPS: 100.0000 mg | ORAL_CAPSULE | Freq: Every evening | ORAL | Status: DC | PRN

## 2014-11-03 MED ORDER — POLYETHYLENE GLYCOL 3350 17 G PO PACK: 17.0000 g | PACK | Freq: Every day | ORAL | Status: DC | PRN

## 2014-11-03 MED ORDER — SODIUM CHLORIDE 0.9 % IV SOLN
1.0000 g | Freq: Two times a day (BID) | INTRAVENOUS | Status: DC
  Administered 2014-11-03 (×2): 1 g via INTRAVENOUS
  Filled 2014-11-03 (×4): qty 1

## 2014-11-03 MED ORDER — ATORVASTATIN CALCIUM 20 MG PO TABS
80.0000 mg | ORAL_TABLET | Freq: Every day | ORAL | Status: DC
  Administered 2014-11-03 – 2014-11-04 (×2): 80 mg via ORAL
  Filled 2014-11-03 (×2): qty 4

## 2014-11-03 MED ORDER — ASPIRIN EC 81 MG PO TBEC
81.0000 mg | DELAYED_RELEASE_TABLET | Freq: Every day | ORAL | Status: DC
  Administered 2014-11-03 – 2014-11-04 (×2): 81 mg via ORAL
  Filled 2014-11-03 (×2): qty 1

## 2014-11-03 MED ORDER — PANTOPRAZOLE SODIUM 40 MG PO TBEC
40.0000 mg | DELAYED_RELEASE_TABLET | Freq: Every day | ORAL | Status: DC
  Administered 2014-11-04: 40 mg via ORAL
  Filled 2014-11-03 (×2): qty 1

## 2014-11-03 MED ORDER — OXYCODONE HCL 5 MG PO TABS
5.0000 mg | ORAL_TABLET | Freq: Three times a day (TID) | ORAL | Status: DC | PRN
  Administered 2014-11-03: 5 mg via ORAL
  Filled 2014-11-03: qty 1

## 2014-11-03 NOTE — Evaluation (Signed)
Clinical/Bedside Swallow Evaluation Patient Details  Name: [name] MRN: [id number] Date of Birth: [birth year]  Today's Date: 11/03/2014 Time: SLP Start Time (ACUTE ONLY): 1115 SLP Stop Time (ACUTE ONLY): 1215 SLP Time Calculation (min) (ACUTE ONLY): 60 min  Past Medical History:  Past Medical History  Diagnosis Date  . Diabetes mellitus without complication ([place])   . Arthritis   . Non Hodgkin's lymphoma ([place]) 2009    s/p stem cell transplant  . Hypertension   . MI (myocardial infarction) ([place])     1990s  . TIA (transient ischemic attack)   . Spinal stenosis   . HLD (hyperlipidemia)   . GERD (gastroesophageal reflux disease)    Past Surgical History:  Past Surgical History  Procedure Laterality Date  . Abdominal hysterectomy    . Toe amputation Left   . Eye surgery Bilateral    HPI:  Pt is a 74 y.o. female w/ a PMH that includes HTN, DM, TIA, GERD, spinal stenosis who presents with acute encephalopathy. Patient is altered and unable to participate in history taking process. Information is given by her daughter who is present with her at bedside in the ED. Her daughter states that her mother was recently brought to the emergency department for evaluation for sciatic pain. At that point she was given a prescription for a long-acting oxycodone. Her daughter states that this was a couple of weeks prior to admission tonight. She states that she initially did well with oxycodone, but then was given a prescription for tramadol by her outpatient physician. Daughter states that her mother did not tolerate the tramadol while, and was sleeping all the time. At one point they brought her in for evaluation because she was difficult to arouse. During her evaluation she was given Narcan and woke up and seemed to respond well, and so it was presumed at that time that it was the tramadol that caused her altered mental state after that, the daughter states that she did not give her any further  narcotic or narcotic derived medications for her pain. On the day of admission she states that her mother was resting in the morning after having been something closer to her usual self the night before. Her daughter did not wake her up that morning, but when she arrived home from work she was told by her onto being cared for her mother that her mother had not woken up all day. The daughter went to see her mother and found her "foaming at the mouth" and with what seemed to be a facial droop. She was noted to have a low blood sugar in the 20s. At this point she brought her to the ED for evaluation. Pt currently awakens w/ stim. She can answer basic questions re: self and follow basic commands. She tended to [facility] when attempting to speak. Dtr appeared relieved as pt "woke up more" and "seemed more like herself". Pt is currently NPO but eating ice chips "fine" last night per Dtr.   Assessment / Plan / Recommendation Clinical Impression  Pt appears to adequately tolerate trials of ice chips, puree, and thin liquids via cup and straw w/ no immediate, overt s/s of aspiration noted during session w/ SLP. Pt required verbal/tactile cues to fully awaken and attend to task initially and assistance w/ the po's but was then able to hold her own cup for self-drinking to reduce risk for aspiration(w/ assist). Pt appeared to fatigue at times during trials but was easily awakened and redirected  to task w/ verbal/tactile stim. Overall, pt appears to tolerate a Dysphagia 1 w/ thin liquids diet following strict aspiration precautions including pinching straws to monitor bolus amounts. Rec. a Dys. 1 w/ thin liquids diet w/ meds in puree - crushed as nec./able and assistance w/ meals. Education given to Dtr on aspiration precautions, monitoring/modifying environment and pt's behaviors, and food/drink options including reducing distractions during meals and making sure pt is fully awake for po's. NSG updated.    Aspiration Risk    (reduced following aspiration precautions)    Diet Recommendation Dysphagia 1 (Puree);Thin   Medication Administration: Crushed with puree Compensations: Minimize environmental distractions;Small sips/bites;Slow rate (must be fully awake/alert)    Other  Recommendations Recommended Consults:  (Dietician) Oral Care Recommendations: Oral care BID;Staff/trained caregiver to provide oral care   Follow Up Recommendations       Frequency and Duration min 3x week  1 week   Pertinent Vitals/Pain denied    SLP Swallow Goals  see care plan   Swallow Study Prior Functional Status   lived at home; ate a regular diet per Dtr.     General Bedside Swallow Evaluation   Oral/Motor/Sensory Function Overall Oral Motor/Sensory Function: Appears within functional limits for tasks assessed (grossly) Labial ROM: Within Functional Limits Labial Symmetry: Within Functional Limits Labial Strength:  (difficult to assess fully) Lingual ROM: Within Functional Limits Lingual Symmetry: Within Functional Limits Lingual Strength: Within Functional Limits Facial Symmetry: Within Functional Limits Mandible: Within Functional Limits   Ice Chips Ice chips: Within functional limits Presentation: Spoon (fed; 6 trials)   Thin Liquid Thin Liquid: Within functional limits Presentation: Cup;Straw (fed; ~3 ozs)    Nectar Thick Nectar Thick Liquid: Not tested   Honey Thick Honey Thick Liquid: Not tested   Puree Puree: Within functional limits Presentation: Spoon;Self Fed (assisted; 4 ozs)   Solid   GO    Solid: Not tested      Orinda Kenner, MS, CCC-SLP  Watson,Katherine 11/03/2014,4:15 PM

## 2014-11-03 NOTE — Plan of Care (Signed)
Problem: Discharge/Transitional Outcomes Goal: Other Discharge Outcomes/Goals Outcome: Progressing Plan of care progress to goal: Remains on contact precautions for positive MRSA PCR. Pt had speech eval done today. Dysphagia diet placed. Meds crushed in applesauce. On room air, O2 sats in 90s. Pt c/o pain in leg, oxycodone given PRN for pain.  Family at the bedside. IVF and IV ABX infusing.  Earlier in the shift, [place] came to place PICC, PICC not placed due to pts creatinine level. MD aware, okay to place IJ. Pt has an equivalent to a PIV in left IJ. Okay to use IV per MD. See paper chart from vascular wellness RN for details.

## 2014-11-03 NOTE — Plan of Care (Signed)
Problem: Discharge/Transitional Outcomes Goal: Other Discharge Outcomes/Goals Outcome: Progressing CCU RN unable to obtain IV access. Order received for PICC line. Telephone consent obtained from daughter, verified by Janalee Dane and Mariane Baumgarten. [facility] notified an will be here first thing this am.

## 2014-11-03 NOTE — Progress Notes (Signed)
PT Cancellation Note  Patient Details Name: [name] MRN: [id number] DOB: 1940/06/21   Cancelled Treatment:    Reason Eval/Treat Not Completed: Patient's level of consciousness. PT evaluation attempted this morning, however the pt is too lethargic to participate. Limited ability to be aroused. Will attempt at later time/date.   Janyth Contes 11/03/2014, 10:13 AM  Janyth Contes, SPT. [phone]

## 2014-11-03 NOTE — Progress Notes (Signed)
[name] NAME: [name]    MR#:  [id number]  DATE OF BIRTH:  [birth year]  SUBJECTIVE:  -Patient is currently lethargic, arousable with verbal commands. At times, following some simple commands like squeezing fingers. No issues other than sugar dropped to 65 and started on d5NS REVIEW OF SYSTEMS:  Review of Systems  Unable to perform ROS: critical illness   DRUG ALLERGIES:   Allergies  Allergen Reactions  . Penicillins Hives    VITALS:  Blood pressure 114/67, pulse 85, temperature 98.6 F (37 C), temperature source Oral, resp. rate 20, height 5\' 4"  (1.626 m), weight 94.6 kg (208 lb 8.9 oz), SpO2 100 %.  PHYSICAL EXAMINATION:  Physical Exam  GENERAL:  74 y.o.-year-old patient lying in the bed, very lethargic and critically ill-appearing, obese EYES: Pupils equal, round, reactive to light and accommodation. No scleral icterus. Extraocular muscles intact.  HEENT: Head atraumatic, normocephalic. Has right facial droop on exam, Oropharynx and nasopharynx clear.  NECK:  Supple, no jugular venous distention. No thyroid enlargement, no tenderness.  LUNGS: Normal breath sounds bilaterally, no wheezing, rales,rhonchi or crepitation. No use of accessory muscles of respiration. Decreased bibasilar breath sounds CARDIOVASCULAR: S1, S2 normal. No rubs, or gallops. 3/6 systolic murmur present ABDOMEN: Soft, nontender, nondistended. Bowel sounds present. No organomegaly or mass.  EXTREMITIES: No pedal edema, cyanosis, or clubbing.  NEUROLOGIC: Unable to do a complete neuro exam due to patient being lethargic. She has obvious right facial droop and when trying to talk her speech is very slurred. Trying to follow simple commands her left hand grip seems slightly weaker but able to move her left leg better. However this is not consistent because she is lethargic and intermittently following commands. -garbled speech PSYCHIATRIC:   patient is lethargic  SKIN: No obvious rash, lesion, or ulcer.    LABORATORY PANEL:   CBC  Recent Labs Lab 11/01/14 0248  WBC 16.3*  HGB 12.0  HCT 35.3  PLT 134*   ------------------------------------------------------------------------------------------------------------------  Chemistries   Recent Labs Lab 10/31/14 2144 11/01/14 0248  NA 135 137  K 3.5 3.4*  CL 97* 99*  CO2 29 27  GLUCOSE 119* 83  BUN 53* 55*  CREATININE 2.36* 2.12*  CALCIUM 9.2 9.0  AST 41  --   ALT 18  --   ALKPHOS 67  --   BILITOT 1.2  --    ------------------------------------------------------------------------------------------------------------------  Cardiac Enzymes  Recent Labs Lab 11/01/14 0844  TROPONINI 0.48*   ------------------------------------------------------------------------------------------------------------------  RADIOLOGY:  US Carotid Bilateral  11/02/2014  CLINICAL DATA:  Facial droop, hypertension, TIA, diabetes. EXAM: BILATERAL CAROTID DUPLEX ULTRASOUND TECHNIQUE: Pearline Cables scale imaging, color Doppler and duplex ultrasound was performed of bilateral carotid and vertebral arteries in the neck. COMPARISON:  None. REVIEW OF SYSTEMS: Quantification of carotid stenosis is based on velocity parameters that correlate the residual internal carotid diameter with NASCET-based stenosis levels, using the diameter of the distal internal carotid lumen as the denominator for stenosis measurement. The following velocity measurements were obtained: PEAK SYSTOLIC/END DIASTOLIC RIGHT ICA:                     48/15cm/sec CCA:                     38/75IE/PPI SYSTOLIC ICA/CCA RATIO:  0.9 DIASTOLIC ICA/CCA RATIO: 1.4 ECA:  74cm/sec LEFT ICA:                     41/8cm/sec CCA:                     08/81JS/RPR SYSTOLIC ICA/CCA RATIO:  0.6 DIASTOLIC ICA/CCA RATIO: 0.8 ECA:                     61cm/sec FINDINGS: RIGHT CAROTID ARTERY: Intimal thickening through the common carotid  artery. No significant plaque accumulation or stenosis. Normal waveforms or color Doppler signal. RIGHT VERTEBRAL ARTERY:  Normal flow direction and waveform. LEFT CAROTID ARTERY: Intimal thickening in the common carotid artery and bulb. No focal plaque accumulation or stenosis. Normal waveforms and color Doppler signal. LEFT VERTEBRAL ARTERY: Normal flow direction and waveform. IMPRESSION: 1. No significant carotid plaque or stenosis. Electronically Signed   By: Lucrezia Europe M.D.   On: 11/02/2014 13:52    EKG:   Orders placed or performed during the hospital encounter of 10/31/14  . ED EKG  . ED EKG    ASSESSMENT AND PLAN:   74 year old African-American female with past medical history significant for diabetes, arthritis, prior history of TIAs, coronary artery disease, hypertension presents to the hospital secondary to a sudden change in mental status.   #1 altered mental status-likely acute stroke based on clinical exam. -CT of the head showing moderate atrophy and no acute abnormalities. Patient does have chronic small vessel ischemic changes noted.  MRI and MRA of the brain ordered.pt refused and non-compliants. -Neurology consult appreciated. Neurologically improving. -PT to see pt - cont Aspirin    #2 Aspiration pneumonia- confirmed by CT chest - likely from CVA or aspirated after went unresponsive - 1/2 blood cultures positive for GPC likely contamination - cont meropenem for now  #3 DM-maintenance nothing by mouth with low sugars  -improving now that she is eating -. On sliding scale insulin -Patient was on Lantus at home-hold for now  #4 HTN - permissive HTN allowed, BP on lower side this morning MAP maintained.  #5 Elevated troponins-could be demand ischemia. -However they're trending up. EKG with no acute findings -Cardiology consult noted. No further w/u recommended at present -Continue aspirin for now  #6 ARF on CKD stage 3- baseline cr at 1.7, now at 2.1 - likely  ATN - kidneys are unremarkable on CT abd -received IV fluids. Check BMP in am  #5  DVT Prophylaxis- SQ lovenox  All the records are reviewed and case discussed with Care Management/Social Workerr. Management plans discussed with the patient, family and they are in agreement.  CODE STATUS: FULL CODE  TOTAL CRITICAL CARE TIME SPENT IN TAKING CARE OF THIS PATIENT: 40 minutes.    Jamarii Banks M.D on 11/03/2014 at 1:59 PM  Between 7am to 6pm - Pager - [phone]  After 6pm go to [email or web address] - password [email or web address] [facility]  Office  [phone]  CC: Primary care physician; Pcp Not In System

## 2014-11-03 NOTE — Care Management (Signed)
Admitted to [facility] with the diagnosis of acute encephalopathy. Lives with daughter, [name] sine August [phone] or [phone]). A [facility] since 2014. [facility] to [facility] times [facility]. [facility] in the past. [facility] at home October 23rd. Transfer from bed to wheelchair. No home oxygen. Bedside commode in the home. Usually self feeds, but need assistance with bathe and dressing. Daughter at the bedside. Meropenum IV ordered for gram + cocci. PICC line placed.  Speech and physical therapy ordered. Shelbie Ammons RN MSN CCM Care Management [phone]

## 2014-11-03 NOTE — Plan of Care (Signed)
Problem: Discharge/Transitional Outcomes Goal: Other Discharge Outcomes/Goals Outcome: Progressing Pt alert to verbal stimuli, but does not answer question's appropriately. Unable to get a accurate NIH because of pts lack of cognition. NIH between 21-28 unable to do MRI because pt could not lay still during exam. NPO right now, she will need speech to see her tomorrow and dietary to assess dietary needs. Both IV's infiltrated awaiting CCU RN to restart IV.

## 2014-11-03 NOTE — Progress Notes (Signed)
ANTIBIOTIC CONSULT NOTE - INITIAL  Pharmacy Consult for meropenem Indication: pneumonia  Allergies  Allergen Reactions  . Penicillins Hives    Patient Measurements: Height: 5\' 4"  (162.6 cm) Weight: 208 lb 8.9 oz (94.6 kg) IBW/kg (Calculated) : 54.7  Vital Signs: Temp: 98.7 F (37.1 C) (10/31 0527) Temp Source: Oral (10/31 0527) BP: 126/55 mmHg (10/31 0527) Pulse Rate: 80 (10/31 0527) Intake/Output from previous day:   Intake/Output from this shift:    Labs:  Recent Labs  10/31/14 2144 11/01/14 0248  WBC 18.2* 16.3*  HGB 12.7 12.0  PLT 146* 134*  CREATININE 2.36* 2.12*   Estimated Creatinine Clearance: 26.4 mL/min (by C-G formula based on Cr of 2.12). No results for input(s): VANCOTROUGH, VANCOPEAK, VANCORANDOM, GENTTROUGH, GENTPEAK, GENTRANDOM, TOBRATROUGH, TOBRAPEAK, TOBRARND, AMIKACINPEAK, AMIKACINTROU, AMIKACIN in the last 72 hours.   Microbiology: Recent Results (from the past 720 hour(s))  Culture, blood (routine x 2)     Status: None (Preliminary result)   Collection Time: 10/31/14 11:12 PM  Result Value Ref Range Status   Specimen Description BLOOD LEFT ASSIST CONTROL  Final   Special Requests BOTTLES DRAWN AEROBIC AND ANAEROBIC 4CC  Final   Culture  Setup Time   Final    GRAM POSITIVE COCCI CRITICAL RESULT CALLED TO, READ BACK BY AND VERIFIED WITH: ADAM SCARBORO,RN 11/01/2014 1857 BY JRS.    Culture   Final    GRAM POSITIVE COCCI IN BOTH AEROBIC AND ANAEROBIC BOTTLES IDENTIFICATION TO FOLLOW    Report Status PENDING  Incomplete  Culture, blood (routine x 2)     Status: None (Preliminary result)   Collection Time: 10/31/14 11:12 PM  Result Value Ref Range Status   Specimen Description BLOOD LEFT HAND  Final   Special Requests BOTTLES DRAWN AEROBIC AND ANAEROBIC 4CC  Final   Culture NO GROWTH 2 DAYS  Final   Report Status PENDING  Incomplete  MRSA PCR Screening     Status: Abnormal   Collection Time: 11/01/14  1:35 AM  Result Value Ref Range  Status   MRSA by PCR POSITIVE (A) NEGATIVE Final    Comment:        The GeneXpert MRSA Assay (FDA approved for NASAL specimens only), is one component of a comprehensive MRSA colonization surveillance program. It is not intended to diagnose MRSA infection nor to guide or monitor treatment for MRSA infections. CRITICAL RESULT CALLED TO, READ BACK BY AND VERIFIED WITH: RENEE BABB RN AT 0245 11/01/2014     Medical History: Past Medical History  Diagnosis Date  . Diabetes mellitus without complication ([place])   . Arthritis   . Non Hodgkin's lymphoma ([place]) 2009    s/p stem cell transplant  . Hypertension   . MI (myocardial infarction) ([place])     1990s  . TIA (transient ischemic attack)   . Spinal stenosis   . HLD (hyperlipidemia)   . GERD (gastroesophageal reflux disease)     Medications:  Infusions:  . dextrose 5 % and 0.45% NaCl 75 mL/hr at 11/02/14 0037   Assessment: 2 yom with AMS limited history available due to mental state. Questioning tramadol overdose, starting meropenem for aspiration pneumonia.    Plan:  Expected duration 7 days with resolution of temperature and/or normalization of WBC.  Will increase dose to Meropenem 1000 mg IV Q12H for creatinine clearance approximately 26.4 mL/min.  Rocky Morel, Pharm.D.  Clinical Pharmacist 11/03/2014,8:41 AM

## 2014-11-03 NOTE — Progress Notes (Signed)
OT Cancellation Note  Patient Details Name: [name] MRN: [id number] DOB: Feb 19, 1940   Cancelled Treatment:    Reason Eval/Treat Not Completed: Medical issues which prohibited therapy, per chart review, patient too lathargic.  Sharon Mt 11/03/2014, 1:34 PM

## 2014-11-04 DIAGNOSIS — I639 Cerebral infarction, unspecified: Secondary | ICD-10-CM | POA: Diagnosis not present

## 2014-11-04 LAB — BASIC METABOLIC PANEL
Anion gap: 5 (ref 5–15)
BUN: 32 mg/dL — AB (ref 6–20)
CALCIUM: 8.8 mg/dL — AB (ref 8.9–10.3)
CO2: 31 mmol/L (ref 22–32)
CREATININE: 1.14 mg/dL — AB (ref 0.44–1.00)
Chloride: 99 mmol/L — ABNORMAL LOW (ref 101–111)
GFR calc Af Amer: 54 mL/min — ABNORMAL LOW (ref >60)
GFR, EST NON AFRICAN AMERICAN: 47 mL/min — AB (ref >60)
GLUCOSE: 195 mg/dL — AB (ref 65–99)
POTASSIUM: 3.2 mmol/L — AB (ref 3.5–5.1)
SODIUM: 135 mmol/L (ref 135–145)

## 2014-11-04 LAB — CULTURE, BLOOD (ROUTINE X 2)

## 2014-11-04 LAB — GLUCOSE, CAPILLARY
GLUCOSE-CAPILLARY: 153 mg/dL — AB (ref 65–99)
Glucose-Capillary: 183 mg/dL — ABNORMAL HIGH (ref 65–99)
Glucose-Capillary: 174 mg/dL — ABNORMAL HIGH (ref 65–99)

## 2014-11-04 LAB — PLATELET COUNT: PLATELETS: 97 10*3/uL — AB (ref 150–440)

## 2014-11-04 MED ORDER — INSULIN ASPART 100 UNIT/ML ~~LOC~~ SOLN
0.0000 [IU] | Freq: Three times a day (TID) | SUBCUTANEOUS | Status: DC
Start: 2014-11-04 — End: 2014-11-04
  Administered 2014-11-04 (×3): 2 [IU] via SUBCUTANEOUS
  Filled 2014-11-04 (×3): qty 2

## 2014-11-04 MED ORDER — POTASSIUM CHLORIDE CRYS ER 20 MEQ PO TBCR
40.0000 meq | EXTENDED_RELEASE_TABLET | Freq: Once | ORAL | Status: AC
  Administered 2014-11-04: 40 meq via ORAL
  Filled 2014-11-04: qty 2

## 2014-11-04 MED ORDER — CETYLPYRIDINIUM CHLORIDE 0.05 % MT LIQD
7.0000 mL | Freq: Two times a day (BID) | OROMUCOSAL | Status: DC
  Administered 2014-11-04: 7 mL via OROMUCOSAL

## 2014-11-04 MED ORDER — PNEUMOCOCCAL VAC POLYVALENT 25 MCG/0.5ML IJ INJ
0.5000 mL | INJECTION | Freq: Once | INTRAMUSCULAR | Status: AC
  Administered 2014-11-04: 16:00:00 0.5 mL via INTRAMUSCULAR
  Filled 2014-11-04: qty 0.5

## 2014-11-04 MED ORDER — LEVOFLOXACIN 500 MG PO TABS: 500.0000 mg | ORAL_TABLET | Freq: Every day | ORAL | Status: DC

## 2014-11-04 MED ORDER — CLINDAMYCIN HCL 150 MG PO CAPS: 450.0000 mg | ORAL_CAPSULE | Freq: Three times a day (TID) | ORAL | Status: DC

## 2014-11-04 MED ORDER — PNEUMOCOCCAL VAC POLYVALENT 25 MCG/0.5ML IJ INJ: 0.5000 mL | INJECTION | INTRAMUSCULAR | Status: DC

## 2014-11-04 MED ORDER — CLINDAMYCIN HCL 300 MG PO CAPS
450.0000 mg | ORAL_CAPSULE | Freq: Three times a day (TID) | ORAL | Status: DC
  Administered 2014-11-04: 14:00:00 450 mg via ORAL
  Filled 2014-11-04 (×2): qty 1

## 2014-11-04 MED ORDER — PNEUMOCOCCAL 13-VAL CONJ VACC IM SUSP
0.5000 mL | INTRAMUSCULAR | Status: DC
  Filled 2014-11-04: qty 0.5

## 2014-11-04 NOTE — Progress Notes (Signed)
Speech Language Pathology Treatment: Dysphagia  Patient Details Name: [name] MRN: [id number] DOB: 25-Oct-1940 Today's Date: 11/04/2014 Time: 1410-1450 SLP Time Calculation (min) (ACUTE ONLY): 40 min  Assessment / Plan / Recommendation Clinical Impression  Pt appeared to adequately tolerate trials of thin liquids and purees w/ no overt s/s of aspiration noted. Pt required moderate assistance sitting/positioning upright in the bed for po's as she tended to lean to her right side and move about in the bed frequently stating she was "restless". Pt was given po trials of thin liquids via cup - No straws. Pt verbally communicated b/t trials w/ clear vocal quality noted b/t trials. Pt was easily distracted during session and required verbal cues to redirect to task. She assisted in holding the cup when drinking. Pt remains in min. increased risk for aspiration sec. to her declined medical and Cognitive status'. Rec. Continue w/ current Pureed diet at this time sec. To this as well as pt's edentulous status(dentures at home per Dtr). ST continue to f/u w/ toleration of diet, education, and trials to upgrade when appropriate. NSG updated.    HPI Other Pertinent Information: Pt appears more easily awake today. She had a visitor briefly; noted mumbled speech and increased leg movements in the bed. Pt stated she was "restless". NSG updated.    Pertinent Vitals Pain Assessment: No/denies pain  SLP Plan  Continue with current plan of care    Recommendations Diet recommendations: Dysphagia 1 (puree);Thin liquid Liquids provided via: Cup Medication Administration: Crushed with puree (as able) Supervision: Staff to assist with self feeding;Full supervision/cueing for compensatory strategies Compensations: Minimize environmental distractions;Slow rate;Small sips/bites;Follow solids with liquid Postural Changes and/or Swallow Maneuvers: Seated upright 90 degrees              General recommendations:  Rehab consult (TBD; dietician consult) Oral Care Recommendations: Oral care BID;Staff/trained caregiver to provide oral care Follow up Recommendations:  (TBD) Plan: Continue with current plan of care    Dawson, [place], CCC-SLP  Watson,Katherine 11/04/2014, 3:18 PM

## 2014-11-04 NOTE — Evaluation (Signed)
Physical Therapy Evaluation Patient Details Name: [name] MRN: [id number] DOB: 1940/06/20 Today's Date: 11/04/2014   History of Present Illness  Pt is a 74 yo female who came to the hospital after being found unresponsive on the floor. Pt was also found to have L sided weakness and facial droop   Clinical Impression  Pt presents with hx of DM, lymphona, MI, TIA, spinal stenosis, GERD and arthritis. Examination reveals that pt is very confused, but not so confused that she can't participate in therapy tasks. Pt was not oriented and seemed a bit impulsive at times stating that she wished to go home. Due to this and the fact that her bed mobility required max assist for sitting upright, transfers and ambulation were deferred this morning. Pt is able to participate in therex but requires lots of simple cues and visual cues for completion. Her attention is lost rather quickly. Strength testing revealed minor disparity between L and R side (L being weaker), however it did not appear to affect her functionally; we will continue to reassess this. Sensation appeared diminished on the L side, however this also was difficult to assess due to cognition. Due to pt's impairments, she will continue to benefit from skilled PT in order for her to eventually return home safely.     Follow Up Recommendations SNF    Equipment Recommendations  Other (comment) (TBD)    Recommendations for Other Services       Precautions / Restrictions Precautions Precautions: Fall Restrictions Weight Bearing Restrictions: No      Mobility  Bed Mobility Overal bed mobility: Needs Assistance Bed Mobility: Supine to Sit     Supine to sit: Max assist;HOB elevated     General bed mobility comments: Pt requires heavy assist for trunk assist and LE management. She can help with her legs and hands if cued appropriately by placing the hands where they need to be. Simple commands work best.   Transfers Overall transfer  level:  (Not appropriate secondary to confusion)                  Ambulation/Gait Ambulation/Gait assistance:  (not appropriate secondary to confusion)              Stairs            Wheelchair Mobility    Modified Rankin (Stroke Patients Only)       Balance Overall balance assessment: History of Falls                                           Pertinent Vitals/Pain Pain Assessment: No/denies pain    Home Living Family/patient expects to be discharged to:: Private residence Living Arrangements: Children Available Help at Discharge: Family;Available 24 hours/day;Personal care attendant ([facility] pt since 2014) Type of Home: House Home Access: Level entry     Home Layout: One level Home Equipment: Walker - 2 wheels;Wheelchair - manual;Bedside commode      Prior Function Level of Independence: Needs assistance      ADL's / Homemaking Assistance Needed: Pt self feeds but needs assist with bathing and dressing         Hand Dominance        Extremity/Trunk Assessment   Upper Extremity Assessment: Difficult to assess due to impaired cognition (Pt able to complete full AROM bilaterally. )  Lower Extremity Assessment: Difficult to assess due to impaired cognition;LLE deficits/detail (Able to complete full AROM bilaterally (at least 3/5 MMT))         Communication   Communication: Expressive difficulties  Cognition Arousal/Alertness: Awake/alert Behavior During Therapy: Anxious (Pt expressing wish to go home) Overall Cognitive Status: Difficult to assess (Pt not o) Area of Impairment: Orientation;Attention Orientation Level: Person;Place;Time;Situation Current Attention Level: Alternating           General Comments: Pt cognition tough to assess secondary to possible expressive difficulties     General Comments      Exercises Other Exercises Other Exercises: Pt was able to participate in bed exercises with  mod assist for facilitation of movement. Counting outloud seems to help her stay attentive. Exercises performed included ankle pumps, heel slides, SLRs, and hip abd      Assessment/Plan    PT Assessment Patient needs continued PT services  PT Diagnosis Difficulty walking;Abnormality of gait;Generalized weakness;Hemiplegia non-dominant side   PT Problem List Decreased strength;Decreased range of motion;Decreased activity tolerance;Decreased balance;Decreased coordination;Decreased mobility  PT Treatment Interventions DME instruction;Gait training;Stair training;Functional mobility training;Therapeutic activities;Therapeutic exercise;Balance training   PT Goals (Current goals can be found in the Care Plan section) Acute Rehab PT Goals Patient Stated Goal: wants to go home PT Goal Formulation: Patient unable to participate in goal setting Time For Goal Achievement: 11/18/14 Potential to Achieve Goals: Good    Frequency 7X/week   Barriers to discharge        Co-evaluation               End of Session Equipment Utilized During Treatment: Gait belt;Oxygen Activity Tolerance: Patient limited by lethargy Patient left: in bed;with call bell/phone within reach;with bed alarm set;with SCD's reapplied Nurse Communication: Mobility status         Time: 6761-9509 PT Time Calculation (min) (ACUTE ONLY): 17 min   Charges:         PT G CodesJanyth Contes 2014-11-15, 1:11 PM  Janyth Contes, SPT. [phone]

## 2014-11-04 NOTE — Plan of Care (Signed)
Problem: Discharge/Transitional Outcomes Goal: Other Discharge Outcomes/Goals Outcome: Not Progressing Pt with NIH score of 31. On dysphagia diet with thin liquids. Pt began to cough with water so pt probably will need to be re-evaluated.  Vital signs stable.

## 2014-11-04 NOTE — Progress Notes (Signed)
CSW was updated by [facility] social worker regarding [employer]. [facility] has reviewed options with Pt's daughter [name] and family will take Pt home via car. CSW updated medical team.   Toma Copier LCSW [phone]

## 2014-11-04 NOTE — Progress Notes (Signed)
CSW was contacted by unit that Pt's family is unable to transport Pt in the car. CSW completed ems form for RN to print and coordinate pickup.   Toma Copier, [place]

## 2014-11-04 NOTE — Care Management (Signed)
Telephone call to [name] at [facility] program. The granddaughter has talk to [facility] representative and would like to terminate services. Services will continue with [facility] until the end of November Physical therapy evaluation completed. Recommends skilled nursing facility. Family at the bedside. Shelbie Ammons RN MSN CCM Care Management [phone]

## 2014-11-04 NOTE — Discharge Summary (Signed)
[facility] NAME: Emmakate Hypes    MR#:  [id number]  DATE OF BIRTH:  [birth year]  DATE OF ADMISSION:  10/31/2014 ADMITTING PHYSICIAN: Lance Coon, MD  DATE OF DISCHARGE: 11/04/14  PRIMARY CARE PHYSICIAN: Pcp Not In System    ADMISSION DIAGNOSIS:  Altered mental status, unspecified altered mental status type [R41.82]  DISCHARGE DIAGNOSIS:   Acute encephalopathy with possible CVA (unable to do MRI due to pt being non-complaints) Aspiration pneumonia  SECONDARY DIAGNOSIS:   Past Medical History  Diagnosis Date  . Diabetes mellitus without complication ([place])   . Arthritis   . Non Hodgkin's lymphoma ([place]) 2009    s/p stem cell transplant  . Hypertension   . MI (myocardial infarction) ([place])     1990s  . TIA (transient ischemic attack)   . Spinal stenosis   . HLD (hyperlipidemia)   . GERD (gastroesophageal reflux disease)     HOSPITAL COURSE:   74 year old African-American female with past medical history significant for diabetes, arthritis, prior history of TIAs, coronary artery disease, hypertension presents to the hospital secondary to a sudden change in mental status.   #1 altered mental status-likely acute stroke based on clinical exam. -CT of the head showing moderate atrophy and no acute abnormalities. Patient does have chronic small vessel ischemic changes noted. MRI and MRA of the brain ordered.pt refused and non-compliant. -Neurology consult appreciated. Neurologically improving. -PT to see pt STR vs HHPT - cont Aspirin   #2 Aspiration pneumonia- confirmed by CT chest - likely from CVA or aspirated after went unresponsive - 1/2 blood cultures positive for GPC likely contamination - change to po clinda  #3 DM-maintenance nothing by mouth with low sugars  -improving now that she is eating -. On sliding scale insulin -Patient was on Lantus at home-resume  #4 HTN - permissive HTN MAP  maintained.  #5 Elevated troponins-could be demand ischemia. -Cardiology consult noted. No further w/u recommended at present -Continue aspirin for now  #6 ARF on CKD stage 3- baseline cr at 1.7, now at 2.1--->down to 1.14 - likely ATN - kidneys are unremarkable on CT abd  #5 DVT Prophylaxis- SQ lovenox  Overall improved D/c home with [facility] services  CONSULTS OBTAINED:  Treatment Team:  Evans Lance, MD Leotis Pain, MD  DRUG ALLERGIES:   Allergies  Allergen Reactions  . Penicillins Hives    DISCHARGE MEDICATIONS:   Current Discharge Medication List    START taking these medications   Details  clindamycin (CLEOCIN) 150 MG capsule Take 3 capsules (450 mg total) by mouth 3 (three) times daily. Qty: 9 capsule, Refills: 0      CONTINUE these medications which have NOT CHANGED   Details  acetaminophen (TYLENOL) 500 MG tablet Take 500 mg by mouth every 8 (eight) hours as needed.    amLODipine (NORVASC) 5 MG tablet Take 5 mg by mouth daily.    aspirin EC 81 MG tablet Take 81 mg by mouth daily.    atorvastatin (LIPITOR) 80 MG tablet Take 80 mg by mouth daily.    brimonidine-timolol (COMBIGAN) 0.2-0.5 % ophthalmic solution Place 1 drop into both eyes every 12 (twelve) hours.    Carboxymethylcellul-Glycerin (OPTIVE) 0.5-0.9 % SOLN Apply 1-2 drops to eye 3 (three) times daily as needed.    chlorthalidone (HYGROTON) 25 MG tablet Take 25 mg by mouth daily.    citalopram (CELEXA) 20 MG tablet Take 20 mg by mouth daily.  docusate sodium (COLACE) 100 MG capsule Take 100 mg by mouth at bedtime as needed for mild constipation.    gabapentin (NEURONTIN) 300 MG capsule Take 300 mg by mouth.    Insulin Glargine (LANTUS SOLOSTAR) 100 UNIT/ML Solostar Pen Inject 60 Units into the skin daily.    lisinopril (PRINIVIL,ZESTRIL) 40 MG tablet Take 40 mg by mouth daily.    naproxen (NAPROSYN) 500 MG tablet Take 500 mg by mouth every morning.    omeprazole (PRILOSEC) 20 MG  capsule Take 20 mg by mouth daily.    oxyCODONE (ROXICODONE) 5 MG immediate release tablet Take 1 tablet (5 mg total) by mouth every 8 (eight) hours as needed. Qty: 20 tablet, Refills: 0    polyethylene glycol (MIRALAX / GLYCOLAX) packet Take 17 g by mouth daily as needed.    Travoprost, BAK Free, (TRAVATAN) 0.004 % SOLN ophthalmic solution Place 1 drop into both eyes.    trolamine salicylate (ASPERCREME) 10 % cream Apply 1 application topically as needed for muscle pain (left hip).    Vitamin D, Ergocalciferol, (DRISDOL) 50000 UNITS CAPS capsule Take 50,000 Units by mouth every 30 (thirty) days.    Zinc Oxide (DESITIN) 13 % CREA Apply topically.        If you experience worsening of your admission symptoms, develop shortness of breath, life threatening emergency, suicidal or homicidal thoughts you must seek medical attention immediately by calling 911 or calling your MD immediately  if symptoms less severe.  You Must read complete instructions/literature along with all the possible adverse reactions/side effects for all the Medicines you take and that have been prescribed to you. Take any new Medicines after you have completely understood and accept all the possible adverse reactions/side effects.   Please note  You were cared for by a hospitalist during your hospital stay. If you have any questions about your discharge medications or the care you received while you were in the hospital after you are discharged, you can call the unit and asked to speak with the hospitalist on call if the hospitalist that took care of you is not available. Once you are discharged, your primary care physician will handle any further medical issues. Please note that NO REFILLS for any discharge medications will be authorized once you are discharged, as it is imperative that you return to your primary care physician (or establish a relationship with a primary care physician if you do not have one) for your  aftercare needs so that they can reassess your need for medications and monitor your lab values. Today   SUBJECTIVE   Singing songs in the room with dter  VITAL SIGNS:  Blood pressure 106/86, pulse 79, temperature 97.8 F (36.6 C), temperature source Oral, resp. rate 18, height 5\' 4"  (1.626 m), weight 94.6 kg (208 lb 8.9 oz), SpO2 100 %.  I/O:   Intake/Output Summary (Last 24 hours) at 11/04/14 1353 Last data filed at 11/04/14 0542  Gross per 24 hour  Intake   1437 ml  Output      2 ml  Net   1435 ml    PHYSICAL EXAMINATION:  GENERAL: 74 y.o.-year-old patient lying in the bed, very lethargic and critically ill-appearing, obese EYES: Pupils equal, round, reactive to light and accommodation. No scleral icterus. Extraocular muscles intact.  HEENT: Head atraumatic, normocephalic. Has right facial droop on exam, Oropharynx and nasopharynx clear.  NECK: Supple, no jugular venous distention. No thyroid enlargement, no tenderness.  LUNGS: Normal breath sounds bilaterally, no wheezing,  rales,rhonchi or crepitation. No use of accessory muscles of respiration. Decreased bibasilar breath sounds CARDIOVASCULAR: S1, S2 normal. No rubs, or gallops. 3/6 systolic murmur present ABDOMEN: Soft, nontender, nondistended. Bowel sounds present. No organomegaly or mass.  EXTREMITIES: No pedal edema, cyanosis, or clubbing.  NEUROLOGIC: moves all extremities well. Some subjective weakness Speech improved PSYCHIATRIC: patient is alert SKIN: No obvious rash, lesion, or ulcer.  DATA REVIEW:   CBC   Recent Labs Lab 11/01/14 0248 11/04/14 0532  WBC 16.3*  --   HGB 12.0  --   HCT 35.3  --   PLT 134* 97*    Chemistries   Recent Labs Lab 10/31/14 2144  11/04/14 0532  NA 135  < > 135  K 3.5  < > 3.2*  CL 97*  < > 99*  CO2 29  < > 31  GLUCOSE 119*  < > 195*  BUN 53*  < > 32*  CREATININE 2.36*  < > 1.14*  CALCIUM 9.2  < > 8.8*  AST 41  --   --   ALT 18  --   --   ALKPHOS 67  --    --   BILITOT 1.2  --   --   < > = values in this interval not displayed.  Microbiology Results   Recent Results (from the past 240 hour(s))  Culture, blood (routine x 2)     Status: None   Collection Time: 10/31/14 11:12 PM  Result Value Ref Range Status   Specimen Description BLOOD LEFT ASSIST CONTROL  Final   Special Requests BOTTLES DRAWN AEROBIC AND ANAEROBIC 4CC  Final   Culture  Setup Time   Final    GRAM POSITIVE COCCI IN BOTH AEROBIC AND ANAEROBIC BOTTLES CRITICAL RESULT CALLED TO, READ BACK BY AND VERIFIED WITH: ADAM SCARBORO,RN 11/01/2014 1857 BY JRS.    Culture   Final    STAPHYLOCOCCUS EPIDERMIDIS IN BOTH AEROBIC AND ANAEROBIC BOTTLES    Report Status 11/04/2014 FINAL  Final   Organism ID, Bacteria STAPHYLOCOCCUS EPIDERMIDIS  Final      Susceptibility   Staphylococcus epidermidis - MIC*    CIPROFLOXACIN <=0.5 SENSITIVE Sensitive     ERYTHROMYCIN >=8 RESISTANT Resistant     GENTAMICIN <=0.5 SENSITIVE Sensitive     OXACILLIN Value in next row Sensitive      <=0.25 SENSITIVEWARNING: For oxacillin-resistant S.aureus and coagulase-negative staphylococci (MRS), other beta-lactam agents, ie, penicillins, beta-lactam/beta-lactamase inhibitor combinations, cephems (with the exception of the cephalosporins with anti-MRSA activity), and carbapenems, may appear active in vitro, but are not effective clinically.  --CLSI, Vol.32 No.3, January 2012, pg 70.    TETRACYCLINE Value in next row Sensitive      <=0.25 SENSITIVEWARNING: For oxacillin-resistant S.aureus and coagulase-negative staphylococci (MRS), other beta-lactam agents, ie, penicillins, beta-lactam/beta-lactamase inhibitor combinations, cephems (with the exception of the cephalosporins with anti-MRSA activity), and carbapenems, may appear active in vitro, but are not effective clinically.  --CLSI, Vol.32 No.3, January 2012, pg 70.    CLINDAMYCIN Value in next row Sensitive      <=0.25 SENSITIVEWARNING: For oxacillin-resistant  S.aureus and coagulase-negative staphylococci (MRS), other beta-lactam agents, ie, penicillins, beta-lactam/beta-lactamase inhibitor combinations, cephems (with the exception of the cephalosporins with anti-MRSA activity), and carbapenems, may appear active in vitro, but are not effective clinically.  --CLSI, Vol.32 No.3, January 2012, pg 70.    * STAPHYLOCOCCUS EPIDERMIDIS  Culture, blood (routine x 2)     Status: None (Preliminary result)   Collection Time: 10/31/14 11:12 PM  Result Value Ref Range Status   Specimen Description BLOOD LEFT HAND  Final   Special Requests BOTTLES DRAWN AEROBIC AND ANAEROBIC 4CC  Final   Culture NO GROWTH 4 DAYS  Final   Report Status PENDING  Incomplete  MRSA PCR Screening     Status: Abnormal   Collection Time: 11/01/14  1:35 AM  Result Value Ref Range Status   MRSA by PCR POSITIVE (A) NEGATIVE Final    Comment:        The GeneXpert MRSA Assay (FDA approved for NASAL specimens only), is one component of a comprehensive MRSA colonization surveillance program. It is not intended to diagnose MRSA infection nor to guide or monitor treatment for MRSA infections. CRITICAL RESULT CALLED TO, READ BACK BY AND VERIFIED WITH: RENEE BABB RN AT 0245 11/01/2014     RADIOLOGY:  No results found.   Management plans discussed with the patient, family and they are in agreement.  CODE STATUS:     Code Status Orders        Start     Ordered   11/01/14 0132  Full code   Continuous     11/01/14 0131    Advance Directive Documentation        Most Recent Value   Type of Advance Directive  Healthcare Power of Attorney   Pre-existing out of facility DNR order (yellow form or pink MOST form)     "MOST" Form in Place?        TOTAL TIME TAKING CARE OF THIS PATIENT: 40 minutes.    Labarron Durnin M.D on 11/04/2014 at 1:53 PM  Between 7am to 6pm - Pager - [phone] After 6pm go to [email or web address] - password [email or web address] [facility]  Office   [phone]  CC: Primary care physician; Pcp Not In System

## 2014-11-04 NOTE — Progress Notes (Signed)
Inpatient Diabetes Program Recommendations  AACE/ADA: New Consensus Statement on Inpatient Glycemic Control (2015)  Target Ranges:  Prepandial:   less than 140 mg/dL      Peak postprandial:   less than 180 mg/dL (1-2 hours)      Critically ill patients:  140 - 180 mg/dL   Review of Glycemic Control:  Results for LYZETTE, REINHARDT (MRN [id number]) as of 11/04/2014 12:53  Ref. Range 11/03/2014 10:50 11/03/2014 15:20 11/03/2014 16:08 11/03/2014 20:41 11/04/2014 07:45  Glucose-Capillary Latest Ref Range: 65-99 mg/dL 102 (H) 184 (H) 199 (H) 175 (H) 183 (H)   Diabetes history: Type 2 diabetes Outpatient Diabetes medications: Lantus 60 units daily Current orders for Inpatient glycemic control:  Novolog sensitive correction tid with meals and sensitive correction at [facility]  Inpatient Diabetes Program Recommendations:    Please consider changing HS correction to bedtime scale per Glycemic Control order set.  Also may consider restarting a portion of patient's home dose of Lantus.  May consider Lantus 15 units daily.  Thanks, Adah Perl, RN, BC-ADM Inpatient Diabetes Coordinator Pager [phone] (8a-5p)

## 2014-11-04 NOTE — Progress Notes (Signed)
CSW was contacted by [facility] social worker regarding Pt's dc plan. Pt's family discussing wanting Pt to go to SNF for rehab. [facility] social worker Larene Beach shared that [facility] will not authorize Pt's stay at [facility]. Pt would have to pay out of pocket if family decided to take Pt home. [facility] reports that they have tried to engage Pt in therapy and Pt refuses. CSW confirmed that Pt also refused to participate in therapy while in hospital. CSW confirmed with social worker at [facility] that family does have the option to have Pt go to SNF if Pt participates in an evaluation with [facility] after discharge. [facility] social worker will update family with coverage update.

## 2014-11-04 NOTE — Care Management Important Message (Signed)
Important Message  Patient Details  Name: CHERIDAN KIBLER MRN: [id number] Date of Birth: 1940-05-17   Medicare Important Message Given:  Yes-second notification given    Shelbie Ammons, RN 11/04/2014, 11:45 AM

## 2014-11-04 NOTE — Discharge Instructions (Signed)
HHPT °

## 2014-11-04 NOTE — Progress Notes (Signed)
Initial Nutrition Assessment   INTERVENTION:   Meals and Snacks: Cater to patient preferences; SLP following Medical Food Supplement Therapy: will recommend Mighty Shakes and Magic Cup on meal trays TID for added nutrition (each supplement provides approximately 300kcals and 9g protein)    NUTRITION DIAGNOSIS:   Swallowing difficulty related to dysphagia as evidenced by  (dysphagia I diet order, SLP following).  GOAL:   Patient will meet greater than or equal to 90% of their needs  MONITOR:    (Energy Intake, Digestive System, Anthropometrics, Skin)  REASON FOR ASSESSMENT:    (Pressure Ulcer)    ASSESSMENT:   Pt admitted with AMS and acute encephalopathy. Per MD note pt with possible stroke however unable to perform MRI at this time. Pt with aspiration pneumonia per MD note. Pt confused this am. Pt had central line placement yesterday; pt unavailable this am on visit, with possible central line pulling.   Past Medical History  Diagnosis Date  . Diabetes mellitus without complication ([place])   . Arthritis   . Non Hodgkin's lymphoma ([place]) 2009    s/p stem cell transplant  . Hypertension   . MI (myocardial infarction) ([place])     1990s  . TIA (transient ischemic attack)   . Spinal stenosis   . HLD (hyperlipidemia)   . GERD (gastroesophageal reflux disease)      Diet Order:  DIET - DYS 1 Room service appropriate?: Yes; Fluid consistency:: Thin    Current Nutrition: Spoke with CNA, Chelsie this am pt ate 100% of waffle, 0% of cream of wheat, and 100% of milk for breakfast this am. RD notes per Nsg progress note, pt with coughing last night while drinking water. Limited documentation as pt on isolation.  Food/Nutrition-Related History: Unable to clarify with pt on visit, per MST no decrease in appetite PTA.   Scheduled Medications:  .  stroke: mapping our early stages of recovery book   Does not apply Once  . antiseptic oral rinse  7 mL Mouth Rinse q12n4p  . aspirin EC   81 mg Oral Daily  . atorvastatin  80 mg Oral q1800  . brimonidine  1 drop Both Eyes BID   And  . timolol  1 drop Both Eyes BID  . Chlorhexidine Gluconate Cloth  6 each Topical Q0600  . citalopram  20 mg Oral Daily  . heparin  5,000 Units Subcutaneous 3 times per day  . insulin aspart  0-9 Units Subcutaneous TID AC & HS  . levofloxacin  500 mg Oral Daily  . mupirocin ointment  1 application Nasal BID  . pantoprazole  40 mg Oral Daily  . pneumococcal 13-valent conjugate vaccine  0.5 mL Intramuscular Tomorrow-1000  . [START ON 11/05/2014] pneumococcal 23 valent vaccine  0.5 mL Intramuscular Tomorrow-1000  . sodium chloride  3 mL Intravenous Q12H  . Travoprost (BAK Free)  1 drop Both Eyes QHS    Continuous Medications:  . dextrose 5 % and 0.45% NaCl 75 mL/hr at 11/04/14 0038     Electrolyte/Renal Profile and Glucose Profile:   Recent Labs Lab 10/31/14 2144 11/01/14 0248 11/04/14 0532  NA 135 137 135  K 3.5 3.4* 3.2*  CL 97* 99* 99*  CO2 29 27 31   BUN 53* 55* 32*  CREATININE 2.36* 2.12* 1.14*  CALCIUM 9.2 9.0 8.8*  GLUCOSE 119* 83 195*   Protein Profile:   Recent Labs Lab 10/31/14 2144  ALBUMIN 3.8    Gastrointestinal Profile: Last BM:  11/04/2014   Nutrition-Focused  Physical Exam Findings:  Unable to complete Nutrition-Focused physical exam at this time.    Weight Change: Per CHL possible 5% weight loss in 1.5 weeks.   Skin:   (Stage II heel pressure ulcer)  Last BM:  11/04/2014  Height:   Ht Readings from Last 1 Encounters:  11/01/14 5\' 4"  (1.626 m)    Weight:   Wt Readings from Last 1 Encounters:  11/01/14 208 lb 8.9 oz (94.6 kg)    Ideal Body Weight:   54.5kg  BMI:  Body mass index is 35.78 kg/(m^2).  Estimated Nutritional Needs: using IBW of 54.5kg  Kcal:  BEE: 1035kcals, TEE: (IF 1.2-1.4)(AF 1.2) 1490-1740kcals  Protein:  65-81g protein (1.2-1.5g/kg)  Fluid:  1363-1669mL of fluid (25-65mL/kg)  EDUCATION NEEDS:   Education needs no  appropriate at this time   [place], RD, LDN Pager [phone]

## 2014-11-04 NOTE — Progress Notes (Signed)
VSS. No neuro changes during the shift. Pt discharged home with [facility]. Discharge instructions and prescription given and explained to pt's daughter. Educational handouts about stroke and smokeless tobacco use given and expained to pt and daughter. Awaiting EMS.

## 2014-11-05 LAB — CULTURE, BLOOD (ROUTINE X 2): CULTURE: NO GROWTH

## 2014-11-10 ENCOUNTER — Telehealth: Payer: Self-pay | Admitting: Pain Medicine

## 2014-11-10 ENCOUNTER — Ambulatory Visit: Payer: PRIVATE HEALTH INSURANCE | Admitting: Pain Medicine

## 2014-11-10 NOTE — Telephone Encounter (Signed)
[facility] canceled appt at 9:25 11-10-14

## 2014-11-10 NOTE — Telephone Encounter (Signed)
[facility] canceled appt at 9:25 11-10-14

## 2014-11-13 ENCOUNTER — Inpatient Hospital Stay
Admission: EM | Admit: 2014-11-13 | Discharge: 2014-11-20 | DRG: 070 | Disposition: A | Discharge status: Skilled Nursing Facility | Payer: Medicare (Managed Care) | Attending: Internal Medicine | Admitting: Internal Medicine

## 2014-11-13 DIAGNOSIS — Z89429 Acquired absence of other toe(s), unspecified side: Secondary | ICD-10-CM

## 2014-11-13 DIAGNOSIS — I252 Old myocardial infarction: Secondary | ICD-10-CM

## 2014-11-13 DIAGNOSIS — E119 Type 2 diabetes mellitus without complications: Secondary | ICD-10-CM

## 2014-11-13 DIAGNOSIS — Z8249 Family history of ischemic heart disease and other diseases of the circulatory system: Secondary | ICD-10-CM

## 2014-11-13 DIAGNOSIS — E860 Dehydration: Secondary | ICD-10-CM | POA: Diagnosis present

## 2014-11-13 DIAGNOSIS — Z66 Do not resuscitate: Secondary | ICD-10-CM | POA: Diagnosis present

## 2014-11-13 DIAGNOSIS — E1122 Type 2 diabetes mellitus with diabetic chronic kidney disease: Secondary | ICD-10-CM | POA: Diagnosis present

## 2014-11-13 DIAGNOSIS — I129 Hypertensive chronic kidney disease with stage 1 through stage 4 chronic kidney disease, or unspecified chronic kidney disease: Secondary | ICD-10-CM | POA: Diagnosis present

## 2014-11-13 DIAGNOSIS — N170 Acute kidney failure with tubular necrosis: Secondary | ICD-10-CM | POA: Diagnosis present

## 2014-11-13 DIAGNOSIS — E11621 Type 2 diabetes mellitus with foot ulcer: Secondary | ICD-10-CM | POA: Diagnosis present

## 2014-11-13 DIAGNOSIS — R05 Cough: Secondary | ICD-10-CM

## 2014-11-13 DIAGNOSIS — K219 Gastro-esophageal reflux disease without esophagitis: Secondary | ICD-10-CM | POA: Diagnosis present

## 2014-11-13 DIAGNOSIS — H409 Unspecified glaucoma: Secondary | ICD-10-CM | POA: Diagnosis present

## 2014-11-13 DIAGNOSIS — Z794 Long term (current) use of insulin: Secondary | ICD-10-CM

## 2014-11-13 DIAGNOSIS — N189 Chronic kidney disease, unspecified: Secondary | ICD-10-CM

## 2014-11-13 DIAGNOSIS — I2510 Atherosclerotic heart disease of native coronary artery without angina pectoris: Secondary | ICD-10-CM | POA: Diagnosis present

## 2014-11-13 DIAGNOSIS — N179 Acute kidney failure, unspecified: Secondary | ICD-10-CM

## 2014-11-13 DIAGNOSIS — G9341 Metabolic encephalopathy: Secondary | ICD-10-CM | POA: Diagnosis not present

## 2014-11-13 DIAGNOSIS — E222 Syndrome of inappropriate secretion of antidiuretic hormone: Secondary | ICD-10-CM | POA: Diagnosis present

## 2014-11-13 DIAGNOSIS — I10 Essential (primary) hypertension: Secondary | ICD-10-CM | POA: Diagnosis present

## 2014-11-13 DIAGNOSIS — Z87891 Personal history of nicotine dependence: Secondary | ICD-10-CM

## 2014-11-13 DIAGNOSIS — L8992 Pressure ulcer of unspecified site, stage 2: Secondary | ICD-10-CM | POA: Diagnosis present

## 2014-11-13 DIAGNOSIS — D631 Anemia in chronic kidney disease: Secondary | ICD-10-CM | POA: Diagnosis present

## 2014-11-13 DIAGNOSIS — Z833 Family history of diabetes mellitus: Secondary | ICD-10-CM

## 2014-11-13 DIAGNOSIS — E162 Hypoglycemia, unspecified: Secondary | ICD-10-CM | POA: Diagnosis not present

## 2014-11-13 DIAGNOSIS — Z90710 Acquired absence of both cervix and uterus: Secondary | ICD-10-CM

## 2014-11-13 DIAGNOSIS — Z7982 Long term (current) use of aspirin: Secondary | ICD-10-CM

## 2014-11-13 DIAGNOSIS — L8990 Pressure ulcer of unspecified site, unspecified stage: Secondary | ICD-10-CM | POA: Insufficient documentation

## 2014-11-13 DIAGNOSIS — N183 Chronic kidney disease, stage 3 (moderate): Secondary | ICD-10-CM | POA: Diagnosis present

## 2014-11-13 DIAGNOSIS — E871 Hypo-osmolality and hyponatremia: Secondary | ICD-10-CM | POA: Diagnosis present

## 2014-11-13 DIAGNOSIS — L97419 Non-pressure chronic ulcer of right heel and midfoot with unspecified severity: Secondary | ICD-10-CM | POA: Diagnosis present

## 2014-11-13 DIAGNOSIS — Z9481 Bone marrow transplant status: Secondary | ICD-10-CM

## 2014-11-13 DIAGNOSIS — E11649 Type 2 diabetes mellitus with hypoglycemia without coma: Secondary | ICD-10-CM | POA: Diagnosis present

## 2014-11-13 DIAGNOSIS — Z8673 Personal history of transient ischemic attack (TIA), and cerebral infarction without residual deficits: Secondary | ICD-10-CM

## 2014-11-13 DIAGNOSIS — F329 Major depressive disorder, single episode, unspecified: Secondary | ICD-10-CM | POA: Diagnosis present

## 2014-11-13 DIAGNOSIS — C8310 Mantle cell lymphoma, unspecified site: Secondary | ICD-10-CM | POA: Diagnosis present

## 2014-11-13 DIAGNOSIS — R059 Cough, unspecified: Secondary | ICD-10-CM

## 2014-11-13 DIAGNOSIS — Z803 Family history of malignant neoplasm of breast: Secondary | ICD-10-CM

## 2014-11-13 DIAGNOSIS — G9340 Encephalopathy, unspecified: Secondary | ICD-10-CM | POA: Diagnosis present

## 2014-11-13 DIAGNOSIS — Z9484 Stem cells transplant status: Secondary | ICD-10-CM

## 2014-11-13 DIAGNOSIS — E785 Hyperlipidemia, unspecified: Secondary | ICD-10-CM | POA: Diagnosis present

## 2014-11-13 LAB — URINALYSIS COMPLETE WITH MICROSCOPIC (ARMC ONLY)
Bilirubin Urine: NEGATIVE
Glucose, UA: NEGATIVE mg/dL
Hgb urine dipstick: NEGATIVE
KETONES UR: NEGATIVE mg/dL
LEUKOCYTES UA: NEGATIVE
NITRITE: NEGATIVE
PH: 5 (ref 5.0–8.0)
PROTEIN: NEGATIVE mg/dL
SPECIFIC GRAVITY, URINE: 1.016 (ref 1.005–1.030)

## 2014-11-13 LAB — CBC WITH DIFFERENTIAL/PLATELET
BASOS ABS: 0 10*3/uL (ref 0–0.1)
BASOS PCT: 0 %
Eosinophils Absolute: 0.1 10*3/uL (ref 0–0.7)
Eosinophils Relative: 1 %
HEMATOCRIT: 32.8 % — AB (ref 35.0–47.0)
Hemoglobin: 11.2 g/dL — ABNORMAL LOW (ref 12.0–16.0)
LYMPHS PCT: 20 %
Lymphs Abs: 1.7 10*3/uL (ref 1.0–3.6)
MCH: 29.3 pg (ref 26.0–34.0)
MCHC: 34 g/dL (ref 32.0–36.0)
MCV: 86.2 fL (ref 80.0–100.0)
Monocytes Absolute: 0.6 10*3/uL (ref 0.2–0.9)
Monocytes Relative: 8 %
NEUTROS ABS: 5.8 10*3/uL (ref 1.4–6.5)
Neutrophils Relative %: 71 %
PLATELETS: 171 10*3/uL (ref 150–440)
RBC: 3.81 MIL/uL (ref 3.80–5.20)
RDW: 13.5 % (ref 11.5–14.5)
WBC: 8.2 10*3/uL (ref 3.6–11.0)

## 2014-11-13 LAB — BASIC METABOLIC PANEL
ANION GAP: 6 (ref 5–15)
BUN: 17 mg/dL (ref 6–20)
CO2: 28 mmol/L (ref 22–32)
Calcium: 8.9 mg/dL (ref 8.9–10.3)
Chloride: 92 mmol/L — ABNORMAL LOW (ref 101–111)
Creatinine, Ser: 1.66 mg/dL — ABNORMAL HIGH (ref 0.44–1.00)
GFR calc non Af Amer: 30 mL/min — ABNORMAL LOW (ref >60)
GFR, EST AFRICAN AMERICAN: 34 mL/min — AB (ref >60)
GLUCOSE: 93 mg/dL (ref 65–99)
POTASSIUM: 3.1 mmol/L — AB (ref 3.5–5.1)
Sodium: 126 mmol/L — ABNORMAL LOW (ref 135–145)

## 2014-11-13 LAB — GLUCOSE, CAPILLARY
Glucose-Capillary: 51 mg/dL — ABNORMAL LOW (ref 65–99)
Glucose-Capillary: 62 mg/dL — ABNORMAL LOW (ref 65–99)
Glucose-Capillary: 85 mg/dL (ref 65–99)
Glucose-Capillary: 75 mg/dL (ref 65–99)
Glucose-Capillary: 68 mg/dL (ref 65–99)

## 2014-11-13 LAB — TROPONIN I: Troponin I: 0.04 ng/mL — ABNORMAL HIGH (ref <0.031)

## 2014-11-13 NOTE — ED Provider Notes (Signed)
[facility] Emergency Department Provider Note    ____________________________________________  Time seen: 1725  I have reviewed the triage vital signs and the nursing notes.   HISTORY  Chief Complaint Hypoglycemia   History limited by: Not Limited   HPI [name] is a 74 y.o. female with history of diabetes and stroke roughly 2 weeks ago presents to the emergency department today after being found with decreased responsiveness by family. The family state that the patient received her normal insulin dose this morning. A couple hours later then they found her with decreased responsiveness. When EMS There her blood sugars were in the 30s. They gave glucagon however were not able to establish an IV. The patient and family state that she has not been eating as much since her stroke however is still on the same dose of insulin. The patient was seen in the emergency department earlier this month for a different episode of hypoglycemia. Patient denies any recent fevers, nausea vomiting or diarrhea.   Past Medical History  Diagnosis Date  . Diabetes mellitus without complication ([place])   . Arthritis   . Non Hodgkin's lymphoma ([place]) 2009    s/p stem cell transplant  . Hypertension   . MI (myocardial infarction) ([place])     1990s  . TIA (transient ischemic attack)   . Spinal stenosis   . HLD (hyperlipidemia)   . GERD (gastroesophageal reflux disease)     Patient Active Problem List   Diagnosis Date Noted  . Acute kidney injury superimposed on chronic kidney disease ([place]) 11/01/2014  . Acute encephalopathy 10/31/2014  . Aspiration pneumonia ([place]) 10/31/2014  . Hypoglycemia 10/31/2014  . HTN (hypertension) 10/31/2014  . Type 2 diabetes mellitus ([place]) 10/31/2014  . CAD (coronary artery disease) 10/31/2014  . DDD (degenerative disc disease), lumbar 06/24/2014  . Facet syndrome, lumbar 06/24/2014  . Spinal stenosis, lumbar region, with neurogenic  claudication 06/24/2014  . Neuropathy due to secondary diabetes ([place]) 06/24/2014    Past Surgical History  Procedure Laterality Date  . Abdominal hysterectomy    . Toe amputation Left   . Eye surgery Bilateral     Current Outpatient Rx  Name  Route  Sig  Dispense  Refill  . acetaminophen (TYLENOL) 500 MG tablet   Oral   Take 500 mg by mouth every 8 (eight) hours as needed.         Marland Kitchen amLODipine (NORVASC) 5 MG tablet   Oral   Take 5 mg by mouth daily.         Marland Kitchen aspirin EC 81 MG tablet   Oral   Take 81 mg by mouth daily.         Marland Kitchen atorvastatin (LIPITOR) 80 MG tablet   Oral   Take 80 mg by mouth daily.         . brimonidine-timolol (COMBIGAN) 0.2-0.5 % ophthalmic solution   Both Eyes   Place 1 drop into both eyes every 12 (twelve) hours.         . Carboxymethylcellul-Glycerin (OPTIVE) 0.5-0.9 % SOLN   Ophthalmic   Apply 1-2 drops to eye 3 (three) times daily as needed.         . chlorthalidone (HYGROTON) 25 MG tablet   Oral   Take 25 mg by mouth daily.         . citalopram (CELEXA) 20 MG tablet   Oral   Take 20 mg by mouth daily.         Marland Kitchen  docusate sodium (COLACE) 100 MG capsule   Oral   Take 100 mg by mouth at bedtime as needed for mild constipation.         . Insulin Glargine (LANTUS SOLOSTAR) 100 UNIT/ML Solostar Pen   Subcutaneous   Inject 60 Units into the skin daily.         Marland Kitchen lisinopril (PRINIVIL,ZESTRIL) 40 MG tablet   Oral   Take 40 mg by mouth daily.         Marland Kitchen omeprazole (PRILOSEC) 20 MG capsule   Oral   Take 20 mg by mouth daily.         . polyethylene glycol (MIRALAX / GLYCOLAX) packet   Oral   Take 17 g by mouth daily as needed.         . Travoprost, BAK Free, (TRAVATAN) 0.004 % SOLN ophthalmic solution   Both Eyes   Place 1 drop into both eyes.         Marland Kitchen trolamine salicylate (ASPERCREME) 10 % cream   Topical   Apply 1 application topically as needed for muscle pain (left hip).         . Vitamin D,  Ergocalciferol, (DRISDOL) 50000 UNITS CAPS capsule   Oral   Take 50,000 Units by mouth every 30 (thirty) days.         . Zinc Oxide (DESITIN) 13 % CREA   Apply externally   Apply topically.           Allergies Penicillins  Family History  Problem Relation Age of Onset  . Hypertension Mother   . Heart disease Mother   . Diabetes Father   . Breast cancer Sister     Social History Social History  Substance Use Topics  . Smoking status: Former Research scientist (life sciences)  . Smokeless tobacco: None  . Alcohol Use: No    Review of Systems  Constitutional: Negative for fever. Cardiovascular: Negative for chest pain. Respiratory: Negative for shortness of breath. Gastrointestinal: Negative for abdominal pain, vomiting and diarrhea. Genitourinary: Negative for dysuria. Musculoskeletal: Negative for back pain. Skin: Negative for rash. Neurological: Negative for headaches, focal weakness or numbness. 10-point ROS otherwise negative.  ____________________________________________   PHYSICAL EXAM:  VITAL SIGNS: ED Triage Vitals  Enc Vitals Group     BP --      Pulse Rate 11/13/14 1606 74     Resp 11/13/14 1606 14     Temp 11/13/14 1606 97.6 F (36.4 C)     Temp Source 11/13/14 1606 Oral     SpO2 11/13/14 1606 99 %     Weight 11/13/14 1606 220 lb (99.791 kg)     Height 11/13/14 1606 5\' 10"  (1.778 m)     Head Cir --      Peak Flow --      Pain Score 11/13/14 1609 0   Constitutional: Alert and oriented. Well appearing and in no distress. Eyes: Conjunctivae are normal. PERRL. Normal extraocular movements. ENT   Head: Normocephalic and atraumatic.   Nose: No congestion/rhinnorhea.   Mouth/Throat: Mucous membranes are moist.   Neck: No stridor. Hematological/Lymphatic/Immunilogical: No cervical lymphadenopathy. Cardiovascular: Normal rate, regular rhythm.  No murmurs, rubs, or gallops. Respiratory: Normal respiratory effort without tachypnea nor retractions. Breath  sounds are clear and equal bilaterally. No wheezes/rales/rhonchi. Gastrointestinal: Soft and nontender. No distention.  Genitourinary: Deferred Musculoskeletal: Normal range of motion in all extremities. No joint effusions.  No lower extremity tenderness nor edema. Neurologic:  Normal speech and language. No gross focal  neurologic deficits are appreciated.  Skin:  Skin is warm, dry and intact. No rash noted. Psychiatric: Mood and affect are normal. Speech and behavior are normal. Patient exhibits appropriate insight and judgment.  ____________________________________________    LABS (pertinent positives/negatives)  Labs Reviewed  CBC WITH DIFFERENTIAL/PLATELET - Abnormal; Notable for the following:    Hemoglobin 11.2 (*)    HCT 32.8 (*)    All other components within normal limits  BASIC METABOLIC PANEL - Abnormal; Notable for the following:    Sodium 126 (*)    Potassium 3.1 (*)    Chloride 92 (*)    Creatinine, Ser 1.66 (*)    GFR calc non Af Amer 30 (*)    GFR calc Af Amer 34 (*)    All other components within normal limits  TROPONIN I - Abnormal; Notable for the following:    Troponin I 0.04 (*)    All other components within normal limits  URINALYSIS COMPLETEWITH MICROSCOPIC ([facility] ONLY) - Abnormal; Notable for the following:    Color, Urine AMBER (*)    APPearance CLEAR (*)    Bacteria, UA RARE (*)    Squamous Epithelial / LPF 0-5 (*)    All other components within normal limits  GLUCOSE, CAPILLARY - Abnormal; Notable for the following:    Glucose-Capillary 51 (*)    All other components within normal limits  GLUCOSE, CAPILLARY - Abnormal; Notable for the following:    Glucose-Capillary 62 (*)    All other components within normal limits  GLUCOSE, CAPILLARY  GLUCOSE, CAPILLARY  GLUCOSE, CAPILLARY  CBG MONITORING, ED     ____________________________________________   EKG  I, Nance Pear, attending physician, personally viewed and interpreted this  EKG  EKG Time: 1608 Rate: 71 Rhythm: normal sinus rhythm Axis: normal Intervals: qtc 470 QRS: narrow, q waves V1 ST changes: no st elevation Impression: abnormal ekg ____________________________________________    RADIOLOGY  None   ____________________________________________   PROCEDURES  Procedure(s) performed: None  Critical Care performed: No  ____________________________________________   INITIAL IMPRESSION / ASSESSMENT AND PLAN / ED COURSE  Pertinent labs & imaging results that were available during my care of the patient were reviewed by me and considered in my medical decision making (see chart for details).  Patient presented to the emergency department today because of an episode of unresponsiveness. She was found be hypoglycemic. During her stay here in the emergency department she was responsive and conversant. She was able to eat and drink however repeat glucose remained low. Given persistent hypoglycemia will plan on admission to the hospital.  ____________________________________________   FINAL CLINICAL IMPRESSION(S) / ED DIAGNOSES  Final diagnoses:  Hypoglycemia     Nance Pear, MD 11/13/14 [phone]

## 2014-11-13 NOTE — ED Notes (Signed)
Patient found unresponsive by home health person. BS 30 per EMS, given 1mg  glucagon IM increased to 52 at 1355.

## 2014-11-14 DIAGNOSIS — L8990 Pressure ulcer of unspecified site, unspecified stage: Secondary | ICD-10-CM | POA: Insufficient documentation

## 2014-11-14 LAB — CBC
HEMATOCRIT: 31.5 % — AB (ref 35.0–47.0)
Hemoglobin: 10.7 g/dL — ABNORMAL LOW (ref 12.0–16.0)
MCH: 28.8 pg (ref 26.0–34.0)
MCHC: 33.9 g/dL (ref 32.0–36.0)
MCV: 85.1 fL (ref 80.0–100.0)
Platelets: 170 10*3/uL (ref 150–440)
RBC: 3.7 MIL/uL — ABNORMAL LOW (ref 3.80–5.20)
RDW: 13.5 % (ref 11.5–14.5)
WBC: 7.1 10*3/uL (ref 3.6–11.0)

## 2014-11-14 LAB — BASIC METABOLIC PANEL
Anion gap: 9 (ref 5–15)
BUN: 20 mg/dL (ref 6–20)
CALCIUM: 8.8 mg/dL — AB (ref 8.9–10.3)
CO2: 28 mmol/L (ref 22–32)
CREATININE: 1.58 mg/dL — AB (ref 0.44–1.00)
Chloride: 89 mmol/L — ABNORMAL LOW (ref 101–111)
GFR calc Af Amer: 36 mL/min — ABNORMAL LOW (ref >60)
GFR calc non Af Amer: 31 mL/min — ABNORMAL LOW (ref >60)
GLUCOSE: 90 mg/dL (ref 65–99)
Potassium: 3.6 mmol/L (ref 3.5–5.1)
Sodium: 126 mmol/L — ABNORMAL LOW (ref 135–145)

## 2014-11-14 LAB — GLUCOSE, CAPILLARY
GLUCOSE-CAPILLARY: 70 mg/dL (ref 65–99)
GLUCOSE-CAPILLARY: 93 mg/dL (ref 65–99)
GLUCOSE-CAPILLARY: 63 mg/dL — AB (ref 65–99)
GLUCOSE-CAPILLARY: 104 mg/dL — AB (ref 65–99)
GLUCOSE-CAPILLARY: 84 mg/dL (ref 65–99)
Glucose-Capillary: 61 mg/dL — ABNORMAL LOW (ref 65–99)
Glucose-Capillary: 92 mg/dL (ref 65–99)
Glucose-Capillary: 95 mg/dL (ref 65–99)
Glucose-Capillary: 97 mg/dL (ref 65–99)

## 2014-11-14 LAB — TROPONIN I
TROPONIN I: 0.04 ng/mL — AB (ref <0.031)
Troponin I: 0.03 ng/mL (ref <0.031)

## 2014-11-14 LAB — MRSA PCR SCREENING: MRSA BY PCR: NEGATIVE

## 2014-11-14 MED ORDER — SODIUM CHLORIDE 0.9 % IJ SOLN
3.0000 mL | Freq: Two times a day (BID) | INTRAMUSCULAR | Status: DC
  Administered 2014-11-16 – 2014-11-20 (×7): 3 mL via INTRAVENOUS

## 2014-11-14 MED ORDER — ATORVASTATIN CALCIUM 20 MG PO TABS
80.0000 mg | ORAL_TABLET | Freq: Every day | ORAL | Status: DC
  Administered 2014-11-14 – 2014-11-19 (×6): 80 mg via ORAL
  Filled 2014-11-14 (×6): qty 4

## 2014-11-14 MED ORDER — ACETAMINOPHEN 650 MG RE SUPP: 650.0000 mg | Freq: Four times a day (QID) | RECTAL | Status: DC | PRN

## 2014-11-14 MED ORDER — ONDANSETRON HCL 4 MG/2ML IJ SOLN: 4.0000 mg | Freq: Four times a day (QID) | INTRAMUSCULAR | Status: DC | PRN

## 2014-11-14 MED ORDER — TIMOLOL MALEATE 0.5 % OP SOLN
1.0000 [drp] | Freq: Two times a day (BID) | OPHTHALMIC | Status: DC
  Administered 2014-11-14 – 2014-11-20 (×13): 1 [drp] via OPHTHALMIC
  Filled 2014-11-14 (×2): qty 5

## 2014-11-14 MED ORDER — GLUCERNA SHAKE PO LIQD
237.0000 mL | ORAL | Status: DC
  Administered 2014-11-16 – 2014-11-19 (×3): 237 mL via ORAL

## 2014-11-14 MED ORDER — SODIUM CHLORIDE 0.9 % IV SOLN
INTRAVENOUS | Status: AC
  Administered 2014-11-14: 10:00:00 via INTRAVENOUS

## 2014-11-14 MED ORDER — ONDANSETRON HCL 4 MG PO TABS: 4.0000 mg | ORAL_TABLET | Freq: Four times a day (QID) | ORAL | Status: DC | PRN

## 2014-11-14 MED ORDER — LATANOPROST 0.005 % OP SOLN
1.0000 [drp] | Freq: Every day | OPHTHALMIC | Status: DC
  Administered 2014-11-14 – 2014-11-19 (×6): 1 [drp] via OPHTHALMIC
  Filled 2014-11-14 (×2): qty 2.5

## 2014-11-14 MED ORDER — ACETAMINOPHEN 325 MG PO TABS
650.0000 mg | ORAL_TABLET | Freq: Four times a day (QID) | ORAL | Status: DC | PRN
  Administered 2014-11-15 – 2014-11-18 (×2): 650 mg via ORAL
  Filled 2014-11-14 (×2): qty 2

## 2014-11-14 MED ORDER — PANTOPRAZOLE SODIUM 40 MG PO TBEC
40.0000 mg | DELAYED_RELEASE_TABLET | Freq: Every day | ORAL | Status: DC
  Administered 2014-11-14 – 2014-11-20 (×7): 40 mg via ORAL
  Filled 2014-11-14 (×7): qty 1

## 2014-11-14 MED ORDER — HEPARIN SODIUM (PORCINE) 5000 UNIT/ML IJ SOLN
5000.0000 [IU] | Freq: Three times a day (TID) | INTRAMUSCULAR | Status: DC
  Administered 2014-11-14: 5000 [IU] via SUBCUTANEOUS
  Filled 2014-11-14: qty 1

## 2014-11-14 MED ORDER — ASPIRIN EC 81 MG PO TBEC
81.0000 mg | DELAYED_RELEASE_TABLET | Freq: Every day | ORAL | Status: DC
  Administered 2014-11-14 – 2014-11-20 (×7): 81 mg via ORAL
  Filled 2014-11-14 (×7): qty 1

## 2014-11-14 MED ORDER — CITALOPRAM HYDROBROMIDE 20 MG PO TABS
20.0000 mg | ORAL_TABLET | Freq: Every day | ORAL | Status: DC
  Administered 2014-11-14 – 2014-11-20 (×7): 20 mg via ORAL
  Filled 2014-11-14 (×7): qty 1

## 2014-11-14 MED ORDER — ENOXAPARIN SODIUM 40 MG/0.4ML ~~LOC~~ SOLN
40.0000 mg | SUBCUTANEOUS | Status: DC
  Administered 2014-11-14 – 2014-11-19 (×6): 40 mg via SUBCUTANEOUS
  Filled 2014-11-14 (×6): qty 0.4

## 2014-11-14 MED ORDER — BRIMONIDINE TARTRATE 0.2 % OP SOLN
1.0000 [drp] | Freq: Two times a day (BID) | OPHTHALMIC | Status: DC
  Administered 2014-11-14 – 2014-11-20 (×13): 1 [drp] via OPHTHALMIC
  Filled 2014-11-14 (×2): qty 5

## 2014-11-14 MED ORDER — DEXTROSE-NACL 5-0.9 % IV SOLN
INTRAVENOUS | Status: DC
  Administered 2014-11-14: 75 mL/h via INTRAVENOUS

## 2014-11-14 MED ORDER — BRIMONIDINE TARTRATE-TIMOLOL 0.2-0.5 % OP SOLN: 1.0000 [drp] | Freq: Two times a day (BID) | OPHTHALMIC | Status: DC

## 2014-11-14 NOTE — Plan of Care (Signed)
Problem: Safety: Goal: Ability to remain free from injury will improve Outcome: Progressing Pt is a high fall risk, hx of multiple falls. Pt is incontinent.   Problem: Pain Managment: Goal: General experience of comfort will improve Outcome: Progressing No c/o of pain at this time, continue to asses.  Problem: Skin Integrity: Goal: Risk for impaired skin integrity will decrease Outcome: Progressing Pt has multiple pressure ulcers, foam in place, repositioned.   Problem: Nutrition: Goal: Adequate nutrition will be maintained Outcome: Progressing Monitoring blood sugar 5 x a day, snacks given. NS with D5 at 75 ml/hr.

## 2014-11-14 NOTE — Care Management Obs Status (Signed)
[facility] NOTIFICATION   Patient Details  Name: [name] MRN: [id number] Date of Birth: 07-Dec-1940   Medicare Observation Status Notification Given:  Yes    Shelbie Ammons, RN 11/14/2014, 10:29 AM

## 2014-11-14 NOTE — Progress Notes (Signed)
FSBS low at dinner, 1 container of orange juice given with no improvement, another container of orange juice with improvement to fsbs 104.

## 2014-11-14 NOTE — Plan of Care (Signed)
Problem: Nutrition: Goal: Adequate nutrition will be maintained Outcome: Not Progressing Alert with confusion, daughter visiting at bedside, denies pain, stage II on buttocks that was redressed with Allevyn patch, afebrile throughout shift, vital signs stable, FSBS decreased in evening, treated with orange juice, poor appetite, physical therapy worked with patient and reports patient unable to stand, weakness secondary to CVA, sodium serum remains at 126, IV fluids infusing, incontinent of urine, uneventful shift.

## 2014-11-14 NOTE — H&P (Signed)
[facility] [place] NAME: [name]    MR#:  [id number]  DATE OF BIRTH:  [birth year]  DATE OF ADMISSION:  11/13/2014  PRIMARY CARE PHYSICIAN: Pcp Not In System   REQUESTING/REFERRING PHYSICIAN: Archie Lee, M.D.  CHIEF COMPLAINT:   Chief Complaint  Patient presents with  . Hypoglycemia    HISTORY OF PRESENT ILLNESS:  [name]  is a 74 y.o. female who presents with acute encephalopathy and hypoglycemia. Patient was found unresponsive by home health nurse. Fingerstick at that time showed glucose to be in the 30s. She was brought to the ED for evaluation. No other significant abnormalities found on workup except for some acute on chronic kidney disease. Patient did have persistent hypoglycemia even in the ED with multiple treatments. Family states that since her stroke she has not been eating very much at all, but has maintained the same insulin regimen. On this writer's evaluation the patient has improved significantly. She is now alert and oriented, though she does still answer some questions unclearly. Hospitalists were called for admission for acute encephalopathy with hypoglycemia and acute on chronic kidney injury.  PAST MEDICAL HISTORY:   Past Medical History  Diagnosis Date  . Diabetes mellitus without complication ([place])   . Arthritis   . Non Hodgkin's lymphoma ([place]) 2009    s/p stem cell transplant  . Hypertension   . MI (myocardial infarction) ([place])     1990s  . TIA (transient ischemic attack)   . Spinal stenosis   . HLD (hyperlipidemia)   . GERD (gastroesophageal reflux disease)     PAST SURGICAL HISTORY:   Past Surgical History  Procedure Laterality Date  . Abdominal hysterectomy    . Toe amputation Left   . Eye surgery Bilateral     SOCIAL HISTORY:   Social History  Substance Use Topics  . Smoking status: Former Research scientist (life sciences)  . Smokeless tobacco: Not on file  . Alcohol Use: No    FAMILY HISTORY:    Family History  Problem Relation Age of Onset  . Hypertension Mother   . Heart disease Mother   . Diabetes Father   . Breast cancer Sister     DRUG ALLERGIES:   Allergies  Allergen Reactions  . Penicillins Hives    MEDICATIONS AT HOME:   Prior to Admission medications   Medication Sig Start Date End Date Taking? Authorizing Provider  acetaminophen (TYLENOL) 500 MG tablet Take 500 mg by mouth every 8 (eight) hours as needed.   Yes Historical Provider, MD  amLODipine (NORVASC) 5 MG tablet Take 5 mg by mouth daily.   Yes Historical Provider, MD  aspirin EC 81 MG tablet Take 81 mg by mouth daily.   Yes Historical Provider, MD  atorvastatin (LIPITOR) 80 MG tablet Take 80 mg by mouth daily.   Yes Historical Provider, MD  brimonidine-timolol (COMBIGAN) 0.2-0.5 % ophthalmic solution Place 1 drop into both eyes every 12 (twelve) hours.   Yes Historical Provider, MD  Carboxymethylcellul-Glycerin (OPTIVE) 0.5-0.9 % SOLN Apply 1-2 drops to eye 3 (three) times daily as needed.   Yes Historical Provider, MD  chlorthalidone (HYGROTON) 25 MG tablet Take 25 mg by mouth daily.   Yes Historical Provider, MD  citalopram (CELEXA) 20 MG tablet Take 20 mg by mouth daily.   Yes Historical Provider, MD  docusate sodium (COLACE) 100 MG capsule Take 100 mg by mouth at bedtime as needed for mild constipation.   Yes Historical Provider, MD  Insulin Glargine (LANTUS SOLOSTAR) 100 UNIT/ML Solostar Pen Inject 60 Units into the skin daily.   Yes Historical Provider, MD  lisinopril (PRINIVIL,ZESTRIL) 40 MG tablet Take 40 mg by mouth daily.   Yes Historical Provider, MD  omeprazole (PRILOSEC) 20 MG capsule Take 20 mg by mouth daily.   Yes Historical Provider, MD  polyethylene glycol (MIRALAX / GLYCOLAX) packet Take 17 g by mouth daily as needed.   Yes Historical Provider, MD  Travoprost, BAK Free, (TRAVATAN) 0.004 % SOLN ophthalmic solution Place 1 drop into both eyes.   Yes Historical Provider, MD  trolamine  salicylate (ASPERCREME) 10 % cream Apply 1 application topically as needed for muscle pain (left hip).   Yes Historical Provider, MD  Vitamin D, Ergocalciferol, (DRISDOL) 50000 UNITS CAPS capsule Take 50,000 Units by mouth every 30 (thirty) days.   Yes Historical Provider, MD  Zinc Oxide (DESITIN) 13 % CREA Apply topically.   Yes Historical Provider, MD    REVIEW OF SYSTEMS:  Review of Systems  Constitutional: Negative for fever, chills, weight loss and malaise/fatigue.  HENT: Negative for ear pain, hearing loss and tinnitus.   Eyes: Negative for blurred vision, double vision, pain and redness.  Respiratory: Negative for cough, hemoptysis and shortness of breath.   Cardiovascular: Negative for chest pain, palpitations, orthopnea and leg swelling.  Gastrointestinal: Negative for nausea, vomiting, abdominal pain, diarrhea and constipation.  Genitourinary: Negative for dysuria, frequency and hematuria.  Musculoskeletal: Negative for back pain, joint pain and neck pain.  Skin:       No acne, rash, or lesions  Neurological: Positive for loss of consciousness. Negative for dizziness, tremors, focal weakness and weakness.  Endo/Heme/Allergies: Negative for polydipsia. Does not bruise/bleed easily.       Hypoglycemia  Psychiatric/Behavioral: Negative for depression. The patient is not nervous/anxious and does not have insomnia.      VITAL SIGNS:   Filed Vitals:   11/13/14 2030 11/13/14 2224 11/13/14 2230 11/13/14 2300  BP: 97/78 108/73 104/90 96/50  Pulse: 95 86 117 80  Temp:      TempSrc:      Resp: 15     Height:      Weight:      SpO2: 100% 99% 100% 100%   Wt Readings from Last 3 Encounters:  11/13/14 99.791 kg (220 lb)  11/01/14 94.6 kg (208 lb 8.9 oz)  10/29/14 103.42 kg (228 lb)    PHYSICAL EXAMINATION:  Physical Exam  Vitals reviewed. Constitutional: She is oriented to person, place, and time. She appears well-developed and well-nourished. No distress.  HENT:  Head:  Normocephalic and atraumatic.  Mouth/Throat: Oropharynx is clear and moist.  Eyes: Conjunctivae and EOM are normal. Pupils are equal, round, and reactive to light. No scleral icterus.  Neck: Normal range of motion. Neck supple. No JVD present. No thyromegaly present.  Cardiovascular: Normal rate, regular rhythm and intact distal pulses.  Exam reveals no gallop and no friction rub.   No murmur heard. Respiratory: Effort normal and breath sounds normal. No respiratory distress. She has no wheezes. She has no rales.  GI: Soft. Bowel sounds are normal. She exhibits no distension. There is no tenderness.  Musculoskeletal: Normal range of motion. She exhibits no edema.  No arthritis, no gout  Lymphadenopathy:    She has no cervical adenopathy.  Neurological: She is alert and oriented to person, place, and time. No cranial nerve deficit.  Still somewhat confused. No dysarthria, no aphasia  Skin: Skin is warm and  dry. No rash noted. No erythema.  Psychiatric: She has a normal mood and affect. Her behavior is normal. Thought content normal.    LABORATORY PANEL:   CBC  Recent Labs Lab 11/13/14 1709  WBC 8.2  HGB 11.2*  HCT 32.8*  PLT 171   ------------------------------------------------------------------------------------------------------------------  Chemistries   Recent Labs Lab 11/13/14 1709  NA 126*  K 3.1*  CL 92*  CO2 28  GLUCOSE 93  BUN 17  CREATININE 1.66*  CALCIUM 8.9   ------------------------------------------------------------------------------------------------------------------  Cardiac Enzymes  Recent Labs Lab 11/13/14 1709  TROPONINI 0.04*   ------------------------------------------------------------------------------------------------------------------  RADIOLOGY:  No results found.  EKG:   Orders placed or performed during the hospital encounter of 11/13/14  . EKG 12-Lead  . EKG 12-Lead    IMPRESSION AND PLAN:  Principal Problem:   Acute  encephalopathy - likely due to her hypoglycemia. This is on the setting of a recent stroke which has affected the patient's baseline reserve. Improving. Admit for observation and monitor overnight. Active Problems:   Hypoglycemia - has come up some in the ED. We'll continue with D5 fluids her on admission. We'll hold all anti-glycemic medications for now.   Acute kidney injury superimposed on chronic kidney disease ([place]) - likely prerenal given her borderline hypotension. See below. gentle fluid administration, monitor for improvement. Avoid nephrotoxins.   HTN (hypertension) - borderline hypotensive here. Hold all antihypertensives for now.   Type 2 diabetes mellitus ([facility]) - carb modified diet. Hold anti-glycemic for now. Before meals, at bedtime and 3 AM glucose checks.   CAD (coronary artery disease) - continue home meds  All the records are reviewed and case discussed with ED provider. Management plans discussed with the patient and/or family.  DVT PROPHYLAXIS: SubQ heparin  ADMISSION STATUS: Observation  CODE STATUS: DNR  TOTAL TIME TAKING CARE OF THIS PATIENT: 45 minutes.    [name] [place] 11/14/2014, 12:00 AM  [facility]  Office  [phone]  CC: Primary care physician; Pcp Not In System

## 2014-11-14 NOTE — Evaluation (Signed)
Physical Therapy Evaluation Patient Details Name: [name] MRN: [id number] DOB: [birth year] Today's Date: 11/14/2014   History of Present Illness  presented to ER secondary to unresponsive episode with noted hypoglycemia (FSBS 30s); admitted with acute encephalopathy.  Of note, patient with recent hospitalization (discharge 11/1) secondary to acute CVA with L-sided weakness.  Clinical Impression  Upon evaluation, patient alert and oriented to self only; generally confused, but does follow simple commands.  Demonstrates noted weakness, possible sensory deficit and moderate ataxia/coordination deficits of L hemi-body (UE > LE) from previous CVA.  Patient currently requiring max assist for all bed mobility and unsupported sitting balance efforts.  Excessive L lateral lean/LOB with absent righting reactions; spontaneously attempts return to supine multiple times with unsupported sitting.  Unsafe for attempts at standing or OOB activities. Would benefit from skilled PT to address above deficits and promote optimal return to PLOF; recommend transition to STR upon discharge from acute hospitalization. Of note, patient noted with white coating to tongue during evaluation (? Thrush-like?); RN informed/aware.     Follow Up Recommendations SNF    Equipment Recommendations       Recommendations for Other Services       Precautions / Restrictions Precautions Precautions: Fall Precaution Comments: isolation Restrictions Weight Bearing Restrictions: No      Mobility  Bed Mobility Overal bed mobility: Needs Assistance Bed Mobility: Supine to Sit     Supine to sit: Max assist     General bed mobility comments: constant cuing, assist for management of LEs, trunk; hand-over-hand guidance for hand placement on rails.    Transfers                 General transfer comment: Unsafe/unable  Ambulation/Gait             General Gait Details: Unsafe/unable  Stairs            Wheelchair Mobility    Modified Rankin (Stroke Patients Only)       Balance Overall balance assessment: Needs assistance Sitting-balance support: No upper extremity supported;Feet supported Sitting balance-Leahy Scale: Poor Sitting balance - Comments: excessive L lateral lean/LOB with absent righting reactions; spontaneously attempts return to supine multiple times with unsupported sitting. Postural control: Left lateral lean                                   Pertinent Vitals/Pain Pain Assessment: No/denies pain    Home Living Family/patient expects to be discharged to:: Private residence Living Arrangements: Children Available Help at Discharge: Family;Available 24 hours/day;Personal care attendant Type of Home: House Home Access: Level entry     Home Layout: One level Home Equipment: Walker - 2 wheels;Wheelchair - manual;Bedside commode Additional Comments: patient unable to provide accurate information due to cognitive impairment; information taken from previous records.  Will verify with family as available.    Prior Function Level of Independence: Needs assistance         Comments: Per previous records, patient max assist for sitting balance at recent discharge     Hand Dominance        Extremity/Trunk Assessment   Upper Extremity Assessment:  (R UE strength and ROM grossly WFL, 4/5; L UE ROM grossly WFL, strength 3+/5, noted ataxia/coordination deficits)           Lower Extremity Assessment:  (R LE strength and ROM grossly WFL, 4+/5; L LE strength and ROM grossly WFL, strength  at least 3-/5 with delay in speed of activation.  Possible sensory deficits)         Communication   Communication: No difficulties  Cognition Arousal/Alertness: Awake/alert Behavior During Therapy: WFL for tasks assessed/performed Overall Cognitive Status: Difficult to assess Area of Impairment: Orientation Orientation Level: Disoriented  to;Place;Time;Situation Current Attention Level: Focused Memory: Decreased short-term memory              General Comments      Exercises Other Exercises Other Exercises: Attempted to incorporate unsupported sitting balance, but patient unable to attend to task, unable to formally participate due to continuous attempts at return to supine with all sitting efforts.      Assessment/Plan    PT Assessment Patient needs continued PT services  PT Diagnosis Difficulty walking;Generalized weakness   PT Problem List Decreased strength;Decreased range of motion;Decreased activity tolerance;Decreased balance;Decreased coordination;Decreased mobility;Decreased cognition;Decreased knowledge of use of DME;Decreased safety awareness;Decreased knowledge of precautions;Impaired sensation  PT Treatment Interventions DME instruction;Gait training;Stair training;Functional mobility training;Therapeutic activities;Therapeutic exercise;Balance training;Neuromuscular re-education;Cognitive remediation;Patient/family education   PT Goals (Current goals can be found in the Care Plan section) Acute Rehab PT Goals Patient Stated Goal: wants to go home PT Goal Formulation: Patient unable to participate in goal setting Time For Goal Achievement: 11/28/14 Potential to Achieve Goals: Fair    Frequency Min 2X/week   Barriers to discharge Decreased caregiver support      Co-evaluation               End of Session   Activity Tolerance: Patient limited by lethargy Patient left: in bed;with call bell/phone within reach;with bed alarm set;with SCD's reapplied      Functional Assessment Tool Used: clinical judgement Functional Limitation: Mobility: Walking and moving around Mobility: Walking and Moving Around Current Status [id number]): At least 80 percent but less than 100 percent impaired, limited or restricted Mobility: Walking and Moving Around Goal Status [phone]): At least 20 percent but less than  40 percent impaired, limited or restricted    Time: [id number] PT Time Calculation (min) (ACUTE ONLY): 19 min   Charges:   PT Evaluation $Initial PT Evaluation Tier I: 1 Procedure     PT G Codes:   PT G-Codes **NOT FOR INPATIENT CLASS** Functional Assessment Tool Used: clinical judgement Functional Limitation: Mobility: Walking and moving around Mobility: Walking and Moving Around Current Status [id number]): At least 80 percent but less than 100 percent impaired, limited or restricted Mobility: Walking and Moving Around Goal Status [phone]): At least 20 percent but less than 40 percent impaired, limited or restricted   Toniqua Melamed H. Owens Shark, PT, DPT, NCS 11/14/2014, 5:15 PM [phone]

## 2014-11-14 NOTE — Care Management (Signed)
Admitted to [facility] with the diagnosis of acute encephalopathy. Discharged from this facility 11/04/2014. Lives with daughter, [name] [phone]). A member of the [facility]. [facility] for skilled services in the past.  Spoke with daughter, [name] at the bedside. States she has problems with second shift staff when her mother was at [facility]. States she has expressed her concerns to [employer] and Clinical biochemist of Nursing at [facility]. Daughter indicated that she was going to [facility] today to have pre-surgery work-up. Plan for a hysterectomy 12/20/14. When her mother is discharged  She would like [facility] to provide transportation. If [facility] can't transport, EMS  Shelbie Ammons RN MSN CCM Case Management [phone]

## 2014-11-14 NOTE — Progress Notes (Signed)
[place] at [facility]   PATIENT NAME: [name]    MR#:  [id number]  DATE OF BIRTH:  November 05, 1940  SUBJECTIVE: 74 yr old  Female with hypoglycemia/acute encephalopathy.found to have BG 30 at home , now is improved to 90.found to have hyponatremia.still sodium 126. patient is alert and oriented. She knows she is in the hospital. Denies any complaints.   CHIEF COMPLAINT:   Chief Complaint  Patient presents with  . Hypoglycemia    REVIEW OF SYSTEMS:   ROS CONSTITUTIONAL: No fever, fatigue or weakness.  EYES: No blurred or double vision.  EARS, NOSE, AND THROAT: No tinnitus or ear pain.  RESPIRATORY: No cough, shortness of breath, wheezing or hemoptysis.  CARDIOVASCULAR: No chest pain, orthopnea, edema.  GASTROINTESTINAL: No nausea, vomiting, diarrhea or abdominal pain.  GENITOURINARY: No dysuria, hematuria.  ENDOCRINE: No polyuria, nocturia,  HEMATOLOGY: No anemia, easy bruising or bleeding SKIN: No rash or lesion. MUSCULOSKELETAL: No joint pain or arthritis.   NEUROLOGIC: No tingling, numbness, weakness.  PSYCHIATRY: No anxiety or depression.   DRUG ALLERGIES:   Allergies  Allergen Reactions  . Penicillins Hives    VITALS:  Blood pressure 123/44, pulse 80, temperature 98.4 F (36.9 C), temperature source Oral, resp. rate 20, height 5\' 10"  (1.778 m), weight 99.791 kg (220 lb), SpO2 100 %.  PHYSICAL EXAMINATION:  GENERAL:  74 y.o.-year-old patient lying in the bed with no acute distress.  EYES: Pupils equal, round, reactive to light and accommodation. No scleral icterus. Extraocular muscles intact.  HEENT: Head atraumatic, normocephalic. Oropharynx and nasopharynx clear.  NECK:  Supple, no jugular venous distention. No thyroid enlargement, no tenderness.  LUNGS: Normal breath sounds bilaterally, no wheezing, rales,rhonchi or crepitation. No use of accessory muscles of respiration.  CARDIOVASCULAR: S1, S2 normal. No murmurs, rubs, or  gallops.  ABDOMEN: Soft, nontender, nondistended. Bowel sounds present. No organomegaly or mass.  EXTREMITIES: No pedal edema, cyanosis, or clubbing.  NEUROLOGIC: Cranial nerves II through XII are intact. Muscle strength 5/5 in all extremities. Sensation intact. Gait not checked.  PSYCHIATRIC: The patient is alert and oriented x 3.  SKIN: No obvious rash, lesion, or ulcer.    LABORATORY PANEL:   CBC  Recent Labs Lab 11/14/14 0551  WBC 7.1  HGB 10.7*  HCT 31.5*  PLT 170   ------------------------------------------------------------------------------------------------------------------  Chemistries   Recent Labs Lab 11/14/14 0551  NA 126*  K 3.6  CL 89*  CO2 28  GLUCOSE 90  BUN 20  CREATININE 1.58*  CALCIUM 8.8*   ------------------------------------------------------------------------------------------------------------------  Cardiac Enzymes  Recent Labs Lab 11/14/14 0551  TROPONINI 0.03   ------------------------------------------------------------------------------------------------------------------  RADIOLOGY:  No results found.  EKG:   Orders placed or performed during the hospital encounter of 11/13/14  . EKG 12-Lead  . EKG 12-Lead    ASSESSMENT AND PLAN:    1. Acute encephalopathy secondary to hypoglycemia, hyponatremia: Encephalopathy resolved. The patient is alert and oriented. Hypoglycemia improved. Continue to hold  Lantus. , Decrease the IV hydration containing D5. Change Accu-Cheks to every 3 hours. #2. hyponatremia secondary to dehydration; continue hydration, recheck the sodium every 8 hours. #3 .deconditioning, generalized weakness: physical therapy evaluation today. #4. Chronic kidney disease stage III; Acute  On CKD  stage3; prerenal secondary to hypotension with ATN; and in the IV hydration, renal function is improving. #5. 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 #6. TIA; continue statins, sprain. #7  history of glaucoma continue eyedrops. #8 .depression continue Celexa  20 mg daily      All the records are reviewed and case discussed with Care Management/Social Workerr. Management plans discussed with the patient, family and they are in agreement.  CODE STATUS: full  TOTAL TIME TAKING CARE OF THIS PATIENT: 35 minutes.   POSSIBLE D/C IN 1-2 DAYS, DEPENDING ON CLINICAL CONDITION.   Epifanio Lesches M.D on 11/14/2014 at 9:00 AM  Between 7am to 6pm - Pager - [phone]  After 6pm go to [email or web address] - password [email or web address] [place] [facility]  Office  [phone]  CC: Primary care physician; Pcp Not In System   Note: This dictation was prepared with Dragon dictation along with smaller phrase technology. Any transcriptional errors that result from this process are unintentional.

## 2014-11-14 NOTE — ED Notes (Signed)
Attempted to call report; spoke with Joselyn who advised that TRU-D was in the room for cleaning. ED charge nurse made aware.

## 2014-11-14 NOTE — Progress Notes (Signed)
Initial Nutrition Assessment   INTERVENTION:   Meals and Snacks: Cater to patient preferences; will send meats chopped with gravy. Medical Food Supplement Therapy: will recommend Glucerna Shake po daily, each supplement provides 220 kcal and 10 grams of protein Coordination of Care: if pt at risk for aspiration, recommend SLP evaulation.   NUTRITION DIAGNOSIS:   Increased nutrient needs related to wound healing as evidenced by estimated needs.  GOAL:   Patient will meet greater than or equal to 90% of their needs  MONITOR:    (Energy Intake, Electrolyte and Renal Profile, Skin, Anthropometrics, Digestive System)  REASON FOR ASSESSMENT:    (Pressure Ulcer)    ASSESSMENT:   Pt admitted with AMS and acute encephalopathy. Pt with recent admission for the same one week ago. Pt with multiple stage II pressure ulcers.   Past Medical History  Diagnosis Date  . Diabetes mellitus without complication ([place])   . Arthritis   . Non Hodgkin's lymphoma ([place]) 2009    s/p stem cell transplant  . Hypertension   . MI (myocardial infarction) ([place])     1990s  . TIA (transient ischemic attack)   . Spinal stenosis   . HLD (hyperlipidemia)   . GERD (gastroesophageal reflux disease)     Diet Order:  Diet heart healthy/carb modified Room service appropriate?: Yes; Fluid consistency:: Thin    Current Nutrition: Pt reports eating breakfast this am of eggs and coffee. Recorded po intake 60% of dinner last night.   Food/Nutrition-Related History: Pt daughter reports pt has a good appetite, but eats 4-5 small amounts during the day not 3 large meals. Per H&P pt has not been eating much since stroke recently. Pt daughter reports wanting to start Ensures but did not want to drive blood sugars up.    Scheduled Medications:  . aspirin EC  81 mg Oral Daily  . atorvastatin  80 mg Oral QHS  . brimonidine  1 drop Both Eyes BID  . citalopram  20 mg Oral Daily  . enoxaparin (LOVENOX) injection  40  mg Subcutaneous Q24H  . feeding supplement (GLUCERNA SHAKE)  237 mL Oral Q24H  . latanoprost  1 drop Both Eyes QHS  . pantoprazole  40 mg Oral QAC breakfast  . sodium chloride  3 mL Intravenous Q12H  . timolol  1 drop Both Eyes BID    Continuous Medications:  . sodium chloride 50 mL/hr at 11/14/14 0932     Electrolyte/Renal Profile and Glucose Profile:   Recent Labs Lab 11/13/14 1709 11/14/14 0551  NA 126* 126*  K 3.1* 3.6  CL 92* 89*  CO2 28 28  BUN 17 20  CREATININE 1.66* 1.58*  CALCIUM 8.9 8.8*  GLUCOSE 93 90   Protein Profile: No results for input(s): ALBUMIN in the last 168 hours.  Gastrointestinal Profile: Last BM:  11/14/2014   Nutrition-Focused Physical Exam Findings:  Unable to complete Nutrition-Focused physical exam at this time.    Weight Change: Per [facility] pt with weight gain since last admission.   Skin:   (Stage II heel and sacral pressure ulcers)   Height:   Ht Readings from Last 1 Encounters:  11/13/14 5\' 10"  (1.778 m)    Weight:   Wt Readings from Last 1 Encounters:  11/13/14 220 lb (99.791 kg)    Wt Readings from Last 10 Encounters:  11/13/14 220 lb (99.791 kg)  11/01/14 208 lb 8.9 oz (94.6 kg)  10/29/14 228 lb (103.42 kg)  10/18/14 220 lb (99.791 kg)  08/20/14 220 lb (99.791 kg)  08/12/14 232 lb (105.235 kg)  07/21/14 244 lb (110.678 kg)  06/24/14 234 lb (106.142 kg)     Ideal Body Weight:   68kg  BMI:  Body mass index is 31.57 kg/(m^2).  Estimated Nutritional Needs:   Kcal:  using IBW of 68kg, BEE: 1265kcals, TEE; (IF 1.2-1.4)(AF 1.2) 1821-2125kcals  Protein:  81-102g protein (1.2-1.5g/kg)  Fluid:  17-2087mL of fluid (25-81mL/kg)  EDUCATION NEEDS:   No education needs identified at this time   [place], RD, LDN Pager [phone]

## 2014-11-15 ENCOUNTER — Observation Stay: Payer: Medicare (Managed Care)

## 2014-11-15 LAB — BASIC METABOLIC PANEL
ANION GAP: 8 (ref 5–15)
BUN: 14 mg/dL (ref 6–20)
CO2: 25 mmol/L (ref 22–32)
CREATININE: 1.2 mg/dL — AB (ref 0.44–1.00)
Calcium: 8.5 mg/dL — ABNORMAL LOW (ref 8.9–10.3)
Chloride: 93 mmol/L — ABNORMAL LOW (ref 101–111)
GFR calc Af Amer: 51 mL/min — ABNORMAL LOW (ref >60)
GFR, EST NON AFRICAN AMERICAN: 44 mL/min — AB (ref >60)
GLUCOSE: 99 mg/dL (ref 65–99)
Potassium: 3.9 mmol/L (ref 3.5–5.1)
Sodium: 126 mmol/L — ABNORMAL LOW (ref 135–145)

## 2014-11-15 LAB — GLUCOSE, CAPILLARY
GLUCOSE-CAPILLARY: 42 mg/dL — AB (ref 65–99)
GLUCOSE-CAPILLARY: 59 mg/dL — AB (ref 65–99)
GLUCOSE-CAPILLARY: 134 mg/dL — AB (ref 65–99)
GLUCOSE-CAPILLARY: 95 mg/dL (ref 65–99)
GLUCOSE-CAPILLARY: 167 mg/dL — AB (ref 65–99)
Glucose-Capillary: 53 mg/dL — ABNORMAL LOW (ref 65–99)
Glucose-Capillary: 151 mg/dL — ABNORMAL HIGH (ref 65–99)
Glucose-Capillary: 139 mg/dL — ABNORMAL HIGH (ref 65–99)

## 2014-11-15 MED ORDER — DEXTROSE 50 % IV SOLN
1.0000 | Freq: Once | INTRAVENOUS | Status: AC
  Administered 2014-11-15: 04:00:00 50 mL via INTRAVENOUS

## 2014-11-15 MED ORDER — SODIUM CHLORIDE 0.9 % IV SOLN
INTRAVENOUS | Status: DC
  Administered 2014-11-15 (×2): via INTRAVENOUS

## 2014-11-15 MED ORDER — DEXTROSE 50 % IV SOLN
INTRAVENOUS | Status: AC
  Filled 2014-11-15: qty 50

## 2014-11-15 NOTE — Progress Notes (Signed)
Pt is alert and oriented, bedbound, c/o sacral pain improved with tylenol, bm throughout shift, incontinent of urine, on room air, family visiting at bedside, appetite improved from yesterday, fsbs stable throughout shift, sodium serum remains low at 126, when patient discharges it will likely be to SNF. Uneventful shift.

## 2014-11-15 NOTE — Progress Notes (Signed)
[place] at [place] NAME: [name]    MR#:  [id number]  DATE OF BIRTH:  May 30, 1940  SUBJECTIVE: Noted to have hypoglycemia with blood glucose of 42 this morning improved range juice.  intake is poor as per the nurse. Patient complains of slight cough. No other complaints.   CHIEF COMPLAINT:   Chief Complaint  Patient presents with  . Hypoglycemia    REVIEW OF SYSTEMS:   Review of Systems  Constitutional: Negative for fever and chills.  HENT: Negative for hearing loss.   Eyes: Negative for blurred vision, double vision and photophobia.  Respiratory: Positive for cough. Negative for hemoptysis and shortness of breath.   Cardiovascular: Negative for palpitations, orthopnea and leg swelling.  Gastrointestinal: Negative for heartburn, nausea, vomiting, abdominal pain and diarrhea.  Genitourinary: Negative for dysuria and urgency.  Musculoskeletal: Negative for myalgias and neck pain.  Skin: Negative for rash.  Neurological: Negative for dizziness, focal weakness, seizures, weakness and headaches.  Endo/Heme/Allergies: Does not bruise/bleed easily.  Psychiatric/Behavioral: Negative for memory loss. The patient does not have insomnia.    CONSTITUTIONAL: No fever, fatigue or weakness.  EYES: No blurred or double vision.  EARS, NOSE, AND THROAT: No tinnitus or ear pain.  RESPIRATORY: No cough, shortness of breath, wheezing or hemoptysis.  CARDIOVASCULAR: No chest pain, orthopnea, edema.  GASTROINTESTINAL: No nausea, vomiting, diarrhea or abdominal pain.  GENITOURINARY: No dysuria, hematuria.  ENDOCRINE: No polyuria, nocturia,  HEMATOLOGY: No anemia, easy bruising or bleeding SKIN: No rash or lesion. MUSCULOSKELETAL: No joint pain or arthritis.   NEUROLOGIC: No tingling, numbness, weakness.  PSYCHIATRY: No anxiety or depression.   DRUG ALLERGIES:   Allergies  Allergen Reactions  . Penicillins Hives    VITALS:  Blood  pressure 139/70, pulse 73, temperature 98.1 F (36.7 C), temperature source Oral, resp. rate 18, height 5\' 10"  (1.778 m), weight 99.791 kg (220 lb), SpO2 100 %.  PHYSICAL EXAMINATION:  GENERAL:  74 y.o.-year-old patient lying in the bed with no acute distress.  EYES: Pupils equal, round, reactive to light and accommodation. No scleral icterus. Extraocular muscles intact.  HEENT: Head atraumatic, normocephalic. Oropharynx and nasopharynx clear.  NECK:  Supple, no jugular venous distention. No thyroid enlargement, no tenderness.  LUNGS: Normal breath sounds bilaterally, no wheezing, rales,rhonchi or crepitation. No use of accessory muscles of respiration.  CARDIOVASCULAR: S1, S2 normal. No murmurs, rubs, or gallops.  ABDOMEN: Soft, nontender, nondistended. Bowel sounds present. No organomegaly or mass.  EXTREMITIES: No pedal edema, cyanosis, or clubbing.  NEUROLOGIC: Cranial nerves II through XII are intact. Muscle strength 5/5 in all extremities. Sensation intact. Gait not checked.  PSYCHIATRIC: The patient is alert and oriented x 3.  SKIN: No obvious rash, lesion, or ulcer.    LABORATORY PANEL:   CBC  Recent Labs Lab 11/14/14 0551  WBC 7.1  HGB 10.7*  HCT 31.5*  PLT 170   ------------------------------------------------------------------------------------------------------------------  Chemistries   Recent Labs Lab 11/14/14 0551  NA 126*  K 3.6  CL 89*  CO2 28  GLUCOSE 90  BUN 20  CREATININE 1.58*  CALCIUM 8.8*   ------------------------------------------------------------------------------------------------------------------  Cardiac Enzymes  Recent Labs Lab 11/14/14 0551  TROPONINI 0.03   ------------------------------------------------------------------------------------------------------------------  RADIOLOGY:  No results found.  EKG:   Orders placed or performed during the hospital encounter of 11/13/14  . EKG 12-Lead  . EKG 12-Lead     ASSESSMENT AND PLAN:    1. Acute encephalopathy secondary to hypoglycemia, hyponatremia:  Encephalopathy resolved. The patient is alert and oriented. . Continue to hold  Lantus. ,  Recurrent hypoglycemia: Secondary to poor by mouth intake, poor renal clearance of Lantus due to his chronic kidney disease stage III. Continue to encourage by mouth intake, check sugars every 3 hours, add Glucerna. #2. hyponatremia secondary to dehydration; continue hydration, recheck the sodium every 8 hours. #3 .deconditioning, generalized weakness: physical therapy recommends SNF placement.  #4. Chronic kidney disease stage III; Acute  On CKD  stage3; prerenal secondary to hypotension with ATN; and in the IV hydration, renal function is improving. #5. 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 #6. TIA; continue statins, aspirin. #7 history of glaucoma continue eyedrops. #8 .depression continue Celexa 20 mg daily 9.Stage II pressure ulcers to the buttocks: Present on admission. Continue dressing changes as per protocol. GI, DVT prophylaxis.  DNR    All the records are reviewed and case discussed with Care Management/Social Workerr. Management plans discussed with the patient, family and they are in agreement.  CODE STATUS: DNR  TOTAL TIME TAKING CARE OF THIS PATIENT: 35 minutes.   POSSIBLE D/C IN 1-2 DAYS, DEPENDING ON CLINICAL CONDITION.   Epifanio Lesches M.D on 11/15/2014 at 7:45 AM  Between 7am to 6pm - Pager - [phone]  After 6pm go to [email or web address] - password [email or web address] [place] [facility]  Office  [phone]  CC: Primary care physician; Pcp Not In System   Note: This dictation was prepared with Dragon dictation along with smaller phrase technology. Any transcriptional errors that result from this process are unintentional.

## 2014-11-16 ENCOUNTER — Observation Stay: Payer: Medicare (Managed Care)

## 2014-11-16 LAB — BASIC METABOLIC PANEL
ANION GAP: 7 (ref 5–15)
BUN: 12 mg/dL (ref 6–20)
CALCIUM: 8.6 mg/dL — AB (ref 8.9–10.3)
CO2: 26 mmol/L (ref 22–32)
CREATININE: 1.17 mg/dL — AB (ref 0.44–1.00)
Chloride: 94 mmol/L — ABNORMAL LOW (ref 101–111)
GFR, EST AFRICAN AMERICAN: 52 mL/min — AB (ref >60)
GFR, EST NON AFRICAN AMERICAN: 45 mL/min — AB (ref >60)
GLUCOSE: 123 mg/dL — AB (ref 65–99)
Potassium: 3.9 mmol/L (ref 3.5–5.1)
Sodium: 127 mmol/L — ABNORMAL LOW (ref 135–145)

## 2014-11-16 LAB — GLUCOSE, CAPILLARY
GLUCOSE-CAPILLARY: 108 mg/dL — AB (ref 65–99)
GLUCOSE-CAPILLARY: 150 mg/dL — AB (ref 65–99)
Glucose-Capillary: 127 mg/dL — ABNORMAL HIGH (ref 65–99)
Glucose-Capillary: 171 mg/dL — ABNORMAL HIGH (ref 65–99)
Glucose-Capillary: 151 mg/dL — ABNORMAL HIGH (ref 65–99)

## 2014-11-16 NOTE — Plan of Care (Signed)
Problem: Nutrition: Goal: Adequate nutrition will be maintained Outcome: Not Progressing Patient continues to have poor appetite, eating good at breakfast not doing well with lunch and dinner, family at bedside, VSS, went for renal u/s today seen by Dr. 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

## 2014-11-16 NOTE — Progress Notes (Signed)
[name] NAME: [name]    MR#:  [id number]  DATE OF BIRTH:  [birth year]  SUBJECTIVE: patient today. Patient denies any complaints. Looks slightly confused.  CHIEF COMPLAINT:   Chief Complaint  Patient presents with  . Hypoglycemia    REVIEW OF SYSTEMS:   Review of Systems  Constitutional: Negative for fever and chills.  HENT: Negative for hearing loss.   Eyes: Negative for blurred vision, double vision and photophobia.  Respiratory: Negative for hemoptysis and shortness of breath.   Cardiovascular: Negative for palpitations, orthopnea and leg swelling.  Gastrointestinal: Negative for heartburn, nausea, vomiting, abdominal pain and diarrhea.  Genitourinary: Negative for dysuria and urgency.  Musculoskeletal: Negative for myalgias and neck pain.  Skin: Negative for rash.  Neurological: Negative for dizziness, focal weakness, seizures, weakness and headaches.  Endo/Heme/Allergies: Does not bruise/bleed easily.  Psychiatric/Behavioral: Negative for memory loss. The patient does not have insomnia.     DRUG ALLERGIES:   Allergies  Allergen Reactions  . Penicillins Hives    VITALS:  Blood pressure 124/64, pulse 74, temperature 98.2 F (36.8 C), temperature source Oral, resp. rate 20, height 5\' 10"  (1.778 m), weight 99.791 kg (220 lb), SpO2 98 %.  PHYSICAL EXAMINATION:  GENERAL:  74 y.o.-year-old patient lying in the bed with no acute distress.  EYES: Pupils equal, round, reactive to light and accommodation. No scleral icterus. Extraocular muscles intact.  HEENT: Head atraumatic, normocephalic. Oropharynx and nasopharynx clear.  NECK:  Supple, no jugular venous distention. No thyroid enlargement, no tenderness.  LUNGS: Normal breath sounds bilaterally, no wheezing, rales,rhonchi or crepitation. No use of accessory muscles of respiration.  CARDIOVASCULAR: S1, S2 normal. No murmurs, rubs, or gallops.  ABDOMEN:  Soft, nontender, nondistended. Bowel sounds present. No organomegaly or mass.  EXTREMITIES: No pedal edema, cyanosis, or clubbing.  NEUROLOGIC: Cranial nerves II through XII are intact. Muscle strength 5/5 in all extremities. Sensation intact. Gait not checked.  PSYCHIATRIC: The patient is alert and oriented x 3.  SKIN: No obvious rash, lesion, or ulcer.    LABORATORY PANEL:   CBC  Recent Labs Lab 11/14/14 0551  WBC 7.1  HGB 10.7*  HCT 31.5*  PLT 170   ------------------------------------------------------------------------------------------------------------------  Chemistries   Recent Labs Lab 11/16/14 0503  NA 127*  K 3.9  CL 94*  CO2 26  GLUCOSE 123*  BUN 12  CREATININE 1.17*  CALCIUM 8.6*   ------------------------------------------------------------------------------------------------------------------  Cardiac Enzymes  Recent Labs Lab 11/14/14 0551  TROPONINI 0.03   ------------------------------------------------------------------------------------------------------------------  RADIOLOGY:  Dg Chest 1 View  11/15/2014  CLINICAL DATA:  Cough. History of diabetes and non-Hodgkin's lymphoma as well as hypertension. Former smoker. EXAM: CHEST 1 VIEW COMPARISON:  10/11/2013 FINDINGS: Cardiac silhouette normal in size and configuration. No mediastinal or hilar masses or evidence of adenopathy. Clear lungs.  No pleural effusion or pneumothorax. Bony thorax is demineralized but grossly intact. IMPRESSION: No acute cardiopulmonary disease. Electronically Signed   By: Lajean Manes M.D.   On: 11/15/2014 11:35    EKG:   Orders placed or performed during the hospital encounter of 11/13/14  . EKG 12-Lead  . EKG 12-Lead    ASSESSMENT AND PLAN:    1. Acute encephalopathy secondary to hypoglycemia, hyponatremia: Encephalopathy resolved. The patient is alert and oriented. . Continue to hold  Lantus. ,  Recurrent hypoglycemia: Secondary to poor by mouth intake,  poor renal clearance of Lantus due to his chronic kidney disease stage III.  Continue to encourage by mouth intake, check sugars every 3 hours, add Glucerna. #2. hyponatremia secondary to dehydration; patient's sodium is up at 127. We'll recheck the sodium tomorrow. But she looks well hydrated so we will discontinue IV fluids. #3 .deconditioning, generalized weakness: physical therapy recommends SNF placement.  #4. Chronic kidney disease stage III; Acute  On CKD  stage3; prerenal secondary to hypotension with ATN;renal function is improving.patient is well hydrated. Hold off on IV fluids. #5. 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 #6. TIA; continue statins, aspirin. #7 history of glaucoma continue eyedrops. #8 .depression continue Celexa 20 mg daily 9.Stage II pressure ulcers to the buttocks: Present on admission. Continue dressing changes as per protocol. GI, DVT prophylaxis. snf placement  Likely discharge in 1-2 days. DNR    All the records are reviewed and case discussed with Care Management/Social Workerr. Management plans discussed with the patient, family and they are in agreement.  CODE STATUS: DNR  TOTAL TIME TAKING CARE OF THIS PATIENT: 35 minutes.   POSSIBLE D/C IN 1-2 DAYS, DEPENDING ON CLINICAL CONDITION.   Epifanio Lesches M.D on 11/16/2014 at 11:35 AM  Between 7am to 6pm - Pager - [phone]  After 6pm go to [email or web address] - password [email or web address] [facility]  Office  [phone]  CC: Primary care physician; Pcp Not In System   Note: This dictation was prepared with Dragon dictation along with smaller phrase technology. Any transcriptional errors that result from this process are unintentional.

## 2014-11-16 NOTE — Plan of Care (Signed)
Problem: Skin Integrity: Goal: Risk for impaired skin integrity will decrease Outcome: Progressing Patient repositioned and kept dry to reduce pressure to heels and sacral area. Dressings dry and intact.   Problem: Nutrition: Goal: Adequate nutrition will be maintained Outcome: Progressing Per report patient has a fair appetite, continue to encourage PO intake.

## 2014-11-16 NOTE — Consult Note (Signed)
[facility] CONSULT NOTE    Date: 11/16/2014                  Patient Name:  [name]  MRN: [id number]  DOB: 1940-08-03  Age / Sex: 74 y.o., female         PCP: Pcp Not In System                 Service Requesting Consult: Dr. Vianne Bulls                 Reason for Consult: hyponatremia            History of Present Illness: Patient is a 74 y.o. female with a PMHx of diabetes mellitus type 2, non-Hodgkin's lymphoma status post stem cell transplant 2009, hypertension, history of coronary artery disease with myocardial infarction, TIA, spinal stenosis, hyperlipidemia, and GERD, who was admitted to [facility] on 11/13/2014 for evaluation of altered mental status secondary to hypoglycemia. She was apparently found by home health nurse and blood glucose was checked. This was found to be 30 and she was subsequently brought here for evaluation. It appears that the patient has underlying chronic kidney disease. Baseline creatinine is 1.1 with an EGFR of 54. Upon presentation this time creatinine was 1.6. With hydration this has now come down back to 1.1. However she still has hyponatremia with a serum sodium of 127. She denies nausea, vomiting, or diarrhea at home. She does not appear to be on diuretic therapy at home. Earlier this month the patient had normal serum sodium of 135.   Medications: Outpatient medications: Prescriptions prior to admission  Medication Sig Dispense Refill Last Dose  . acetaminophen (TYLENOL) 500 MG tablet Take 500 mg by mouth every 8 (eight) hours as needed.   prn at prn  . amLODipine (NORVASC) 5 MG tablet Take 5 mg by mouth daily.   unknown at unknown  . aspirin EC 81 MG tablet Take 81 mg by mouth daily.   unknown at unknown  . atorvastatin (LIPITOR) 80 MG tablet Take 80 mg by mouth daily.   unknown at unknown  . brimonidine-timolol (COMBIGAN) 0.2-0.5 % ophthalmic solution Place 1 drop into both eyes every 12 (twelve) hours.   unknown at unknown   . Carboxymethylcellul-Glycerin (OPTIVE) 0.5-0.9 % SOLN Apply 1-2 drops to eye 3 (three) times daily as needed.   prn at prn  . chlorthalidone (HYGROTON) 25 MG tablet Take 25 mg by mouth daily.   unknown at unknown  . citalopram (CELEXA) 20 MG tablet Take 20 mg by mouth daily.   unknown at unknown  . docusate sodium (COLACE) 100 MG capsule Take 100 mg by mouth at bedtime as needed for mild constipation.   prn at prn  . Insulin Glargine (LANTUS SOLOSTAR) 100 UNIT/ML Solostar Pen Inject 60 Units into the skin daily.   unknown at unknown  . lisinopril (PRINIVIL,ZESTRIL) 40 MG tablet Take 40 mg by mouth daily.   unknown at unknown  . omeprazole (PRILOSEC) 20 MG capsule Take 20 mg by mouth daily.   unknown at unknown  . polyethylene glycol (MIRALAX / GLYCOLAX) packet Take 17 g by mouth daily as needed.   prn at prn  . Travoprost, BAK Free, (TRAVATAN) 0.004 % SOLN ophthalmic solution Place 1 drop into both eyes.   unknown at unknown  . trolamine salicylate (ASPERCREME) 10 % cream Apply 1 application topically as needed for muscle pain (left hip).   prn at prn  .  Vitamin D, Ergocalciferol, (DRISDOL) 50000 UNITS CAPS capsule Take 50,000 Units by mouth every 30 (thirty) days.   Past Month at Unknown time  . Zinc Oxide (DESITIN) 13 % CREA Apply topically.   prn at prn    Current medications: Current Facility-Administered Medications  Medication Dose Route Frequency Provider Last Rate Last Dose  . acetaminophen (TYLENOL) tablet 650 mg  650 mg Oral Q6H PRN Lance Coon, MD   650 mg at 11/15/14 0623   Or  . acetaminophen (TYLENOL) suppository 650 mg  650 mg Rectal Q6H PRN Lance Coon, MD      . aspirin EC tablet 81 mg  81 mg Oral Daily Lance Coon, MD   81 mg at 11/16/14 1010  . atorvastatin (LIPITOR) tablet 80 mg  80 mg Oral QHS Lance Coon, MD   80 mg at 11/15/14 2232  . brimonidine (ALPHAGAN) 0.2 % ophthalmic solution 1 drop  1 drop Both Eyes BID Lance Coon, MD   1 drop at 11/16/14 1011  .  citalopram (CELEXA) tablet 20 mg  20 mg Oral Daily Lance Coon, MD   20 mg at 11/16/14 1010  . enoxaparin (LOVENOX) injection 40 mg  40 mg Subcutaneous Q24H Epifanio Lesches, MD   40 mg at 11/15/14 2232  . feeding supplement (GLUCERNA SHAKE) (GLUCERNA SHAKE) liquid 237 mL  237 mL Oral Q24H Epifanio Lesches, MD   237 mL at 11/14/14 1500  . latanoprost (XALATAN) 0.005 % ophthalmic solution 1 drop  1 drop Both Eyes QHS Lance Coon, MD   1 drop at 11/15/14 2232  . ondansetron (ZOFRAN) tablet 4 mg  4 mg Oral Q6H PRN Lance Coon, MD       Or  . ondansetron [facility]) injection 4 mg  4 mg Intravenous Q6H PRN Lance Coon, MD      . pantoprazole (PROTONIX) EC tablet 40 mg  40 mg Oral QAC breakfast Lance Coon, MD   40 mg at 11/16/14 1010  . sodium chloride 0.9 % injection 3 mL  3 mL Intravenous Q12H Lance Coon, MD   3 mL at 11/14/14 0245  . timolol (TIMOPTIC) 0.5 % ophthalmic solution 1 drop  1 drop Both Eyes BID Lance Coon, MD   1 drop at 11/16/14 1010      Allergies: Allergies  Allergen Reactions  . Penicillins Hives      Past Medical History: Past Medical History  Diagnosis Date  . Diabetes mellitus without complication ([place])   . Arthritis   . Non Hodgkin's lymphoma ([place]) 2009    s/p stem cell transplant  . Hypertension   . MI (myocardial infarction) ([place])     1990s  . TIA (transient ischemic attack)   . Spinal stenosis   . HLD (hyperlipidemia)   . GERD (gastroesophageal reflux disease)      Past Surgical History: Past Surgical History  Procedure Laterality Date  . Abdominal hysterectomy    . Toe amputation Left   . Eye surgery Bilateral      Family History: Family History  Problem Relation Age of Onset  . Hypertension Mother   . Heart disease Mother   . Diabetes Father   . Breast cancer Sister      Social History: Social History   Social History  . Marital Status: Widowed    Spouse Name: N/A  . Number of Children: N/A  . Years of Education: N/A    Occupational History  . Not on file.   Social History Main Topics  .  Smoking status: Former Research scientist (life sciences)  . Smokeless tobacco: Not on file  . Alcohol Use: No  . Drug Use: No  . Sexual Activity: Not on file   Other Topics Concern  . Not on file   Social History Narrative     Review of Systems: Review of Systems  Constitutional: Positive for malaise/fatigue. Negative for fever, chills, weight loss and diaphoresis.  HENT: Negative for ear pain and hearing loss.   Eyes: Negative for blurred vision and double vision.  Respiratory: Positive for cough. Negative for hemoptysis, sputum production and shortness of breath.   Cardiovascular: Negative for chest pain, palpitations and orthopnea.  Gastrointestinal: Negative for heartburn, nausea, vomiting and diarrhea.  Genitourinary: Negative for dysuria, urgency and frequency.  Musculoskeletal: Negative for myalgias and neck pain.  Skin: Negative for itching and rash.  Neurological: Positive for loss of consciousness. Negative for dizziness, seizures, weakness and headaches.  Endo/Heme/Allergies: Negative for polydipsia. Does not bruise/bleed easily.  Psychiatric/Behavioral: Negative for depression. The patient is not nervous/anxious.      Vital Signs: Blood pressure 124/64, pulse 74, temperature 98.2 F (36.8 C), temperature source Oral, resp. rate 20, height 5' 10"  (1.778 m), weight 99.791 kg (220 lb), SpO2 98 %.  Weight trends: Filed Weights   11/13/14 1606  Weight: 99.791 kg (220 lb)    Physical Exam: General: NAD, obese  Head: Normocephalic, atraumatic.  Eyes: Anicteric, EOMI  Nose: Mucous membranes moist, not inflammed, nonerythematous.  Throat: Oropharynx nonerythematous, no exudate appreciated.   Neck: Supple trachea midline.  Lungs:  Normal respiratory effort. Clear to auscultation BL without crackles or wheezes.  Heart: RRR. S1 and S2 normal without gallop, murmur, or rubs.  Abdomen:  BS normoactive. Soft,  Nondistended, non-tender.  No masses or organomegaly.  Extremities: No pretibial edema.  Neurologic: A&O X3, Motor strength is 5/5 in the all 4 extremities  Skin: No visible rashes, scars.    Lab results: Basic Metabolic Panel:  Recent Labs Lab 11/14/14 0551 11/15/14 0754 11/16/14 0503  NA 126* 126* 127*  K 3.6 3.9 3.9  CL 89* 93* 94*  CO2 28 25 26   GLUCOSE 90 99 123*  BUN 20 14 12   CREATININE 1.58* 1.20* 1.17*  CALCIUM 8.8* 8.5* 8.6*    Liver Function Tests: No results for input(s): AST, ALT, ALKPHOS, BILITOT, PROT, ALBUMIN in the last 168 hours. No results for input(s): LIPASE, AMYLASE in the last 168 hours. No results for input(s): AMMONIA in the last 168 hours.  CBC:  Recent Labs Lab 11/13/14 1709 11/14/14 0551  WBC 8.2 7.1  NEUTROABS 5.8  --   HGB 11.2* 10.7*  HCT 32.8* 31.5*  MCV 86.2 85.1  PLT 171 170    Cardiac Enzymes:  Recent Labs Lab 11/13/14 1709 11/14/14 0037 11/14/14 0551  TROPONINI 0.04* 0.04* 0.03    BNP: Invalid input(s): POCBNP  CBG:  Recent Labs Lab 11/15/14 1613 11/15/14 2145 11/16/14 0321 11/16/14 0733 11/16/14 1122  GLUCAP 139* 167* 127* 108* 171*    Microbiology: Results for orders placed or performed during the hospital encounter of 11/13/14  MRSA PCR Screening     Status: None   Collection Time: 11/14/14  3:15 AM  Result Value Ref Range Status   MRSA by PCR NEGATIVE NEGATIVE Final    Comment:        The GeneXpert MRSA Assay (FDA approved for NASAL specimens only), is one component of a comprehensive MRSA colonization surveillance program. It is not intended to diagnose MRSA infection nor  to guide or monitor treatment for MRSA infections.     Coagulation Studies: No results for input(s): LABPROT, INR in the last 72 hours.  Urinalysis:  Recent Labs  11/13/14 1709  COLORURINE AMBER*  LABSPEC 1.016  PHURINE 5.0  GLUCOSEU NEGATIVE  HGBUR NEGATIVE  BILIRUBINUR NEGATIVE  KETONESUR NEGATIVE   PROTEINUR NEGATIVE  NITRITE NEGATIVE  LEUKOCYTESUR NEGATIVE      Imaging: Dg Chest 1 View  11/15/2014  CLINICAL DATA:  Cough. History of diabetes and non-Hodgkin's lymphoma as well as hypertension. Former smoker. EXAM: CHEST 1 VIEW COMPARISON:  10/11/2013 FINDINGS: Cardiac silhouette normal in size and configuration. No mediastinal or hilar masses or evidence of adenopathy. Clear lungs.  No pleural effusion or pneumothorax. Bony thorax is demineralized but grossly intact. IMPRESSION: No acute cardiopulmonary disease. Electronically Signed   By: Lajean Manes M.D.   On: 11/15/2014 11:35      Assessment & Plan: Pt is a 74 y.o. female   with a PMHx of diabetes mellitus type 2, non-Hodgkin's lymphoma status post stem cell transplant 2009, hypertension, history of coronary artery disease with myocardial infarction, TIA, spinal stenosis, hyperlipidemia, and GERD, who was admitted to [facility] on 11/13/2014 for evaluation of altered mental status secondary to hypoglycemia.   1.  Acute renal failure, improved. 2.  CKD stage III baseline Cr 1.1 egfr 54. 3.  Hyponatremia. 4.  Anemia of CKD.   Plan:  The patient does appear to have had an episode of acute renal failure which has improved with conservative management. We will proceed with additional workup including renal ultrasound, SPEP, UPEP, and ANA. In regards to her hyponatremia we suspect that there may be some element of volume depletion. We will start the patient back on 0.9 normal saline at 50 cc per hour. Continue to monitor serum sodium. No urgent indication for Epogen given her hemoglobin is greater than 10. Further plan as patient progresses. Thanks for consultation.

## 2014-11-17 DIAGNOSIS — Z8249 Family history of ischemic heart disease and other diseases of the circulatory system: Secondary | ICD-10-CM | POA: Diagnosis not present

## 2014-11-17 DIAGNOSIS — Z794 Long term (current) use of insulin: Secondary | ICD-10-CM | POA: Diagnosis not present

## 2014-11-17 DIAGNOSIS — Z8673 Personal history of transient ischemic attack (TIA), and cerebral infarction without residual deficits: Secondary | ICD-10-CM | POA: Diagnosis not present

## 2014-11-17 DIAGNOSIS — E871 Hypo-osmolality and hyponatremia: Secondary | ICD-10-CM | POA: Diagnosis present

## 2014-11-17 DIAGNOSIS — Z87891 Personal history of nicotine dependence: Secondary | ICD-10-CM | POA: Diagnosis not present

## 2014-11-17 DIAGNOSIS — H409 Unspecified glaucoma: Secondary | ICD-10-CM | POA: Diagnosis present

## 2014-11-17 DIAGNOSIS — E1122 Type 2 diabetes mellitus with diabetic chronic kidney disease: Secondary | ICD-10-CM | POA: Diagnosis present

## 2014-11-17 DIAGNOSIS — R4182 Altered mental status, unspecified: Secondary | ICD-10-CM | POA: Diagnosis not present

## 2014-11-17 DIAGNOSIS — Z90710 Acquired absence of both cervix and uterus: Secondary | ICD-10-CM | POA: Diagnosis not present

## 2014-11-17 DIAGNOSIS — N183 Chronic kidney disease, stage 3 (moderate): Secondary | ICD-10-CM | POA: Diagnosis present

## 2014-11-17 DIAGNOSIS — F329 Major depressive disorder, single episode, unspecified: Secondary | ICD-10-CM | POA: Diagnosis present

## 2014-11-17 DIAGNOSIS — E11649 Type 2 diabetes mellitus with hypoglycemia without coma: Secondary | ICD-10-CM | POA: Diagnosis present

## 2014-11-17 DIAGNOSIS — N170 Acute kidney failure with tubular necrosis: Secondary | ICD-10-CM | POA: Diagnosis present

## 2014-11-17 DIAGNOSIS — I252 Old myocardial infarction: Secondary | ICD-10-CM | POA: Diagnosis not present

## 2014-11-17 DIAGNOSIS — L8992 Pressure ulcer of unspecified site, stage 2: Secondary | ICD-10-CM | POA: Diagnosis present

## 2014-11-17 DIAGNOSIS — G9341 Metabolic encephalopathy: Secondary | ICD-10-CM | POA: Diagnosis present

## 2014-11-17 DIAGNOSIS — E222 Syndrome of inappropriate secretion of antidiuretic hormone: Secondary | ICD-10-CM | POA: Diagnosis present

## 2014-11-17 DIAGNOSIS — L97419 Non-pressure chronic ulcer of right heel and midfoot with unspecified severity: Secondary | ICD-10-CM | POA: Diagnosis present

## 2014-11-17 DIAGNOSIS — E860 Dehydration: Secondary | ICD-10-CM | POA: Diagnosis present

## 2014-11-17 DIAGNOSIS — K219 Gastro-esophageal reflux disease without esophagitis: Secondary | ICD-10-CM | POA: Diagnosis present

## 2014-11-17 DIAGNOSIS — E785 Hyperlipidemia, unspecified: Secondary | ICD-10-CM | POA: Diagnosis present

## 2014-11-17 DIAGNOSIS — Z9481 Bone marrow transplant status: Secondary | ICD-10-CM | POA: Diagnosis not present

## 2014-11-17 DIAGNOSIS — Z803 Family history of malignant neoplasm of breast: Secondary | ICD-10-CM | POA: Diagnosis not present

## 2014-11-17 DIAGNOSIS — E162 Hypoglycemia, unspecified: Secondary | ICD-10-CM | POA: Diagnosis not present

## 2014-11-17 DIAGNOSIS — Z7982 Long term (current) use of aspirin: Secondary | ICD-10-CM | POA: Diagnosis not present

## 2014-11-17 DIAGNOSIS — C8310 Mantle cell lymphoma, unspecified site: Secondary | ICD-10-CM | POA: Diagnosis present

## 2014-11-17 DIAGNOSIS — I129 Hypertensive chronic kidney disease with stage 1 through stage 4 chronic kidney disease, or unspecified chronic kidney disease: Secondary | ICD-10-CM | POA: Diagnosis present

## 2014-11-17 DIAGNOSIS — Z9484 Stem cells transplant status: Secondary | ICD-10-CM | POA: Diagnosis not present

## 2014-11-17 DIAGNOSIS — I2510 Atherosclerotic heart disease of native coronary artery without angina pectoris: Secondary | ICD-10-CM | POA: Diagnosis present

## 2014-11-17 DIAGNOSIS — Z833 Family history of diabetes mellitus: Secondary | ICD-10-CM | POA: Diagnosis not present

## 2014-11-17 DIAGNOSIS — E11621 Type 2 diabetes mellitus with foot ulcer: Secondary | ICD-10-CM | POA: Diagnosis present

## 2014-11-17 DIAGNOSIS — Z8572 Personal history of non-Hodgkin lymphomas: Secondary | ICD-10-CM | POA: Diagnosis not present

## 2014-11-17 DIAGNOSIS — Z89429 Acquired absence of other toe(s), unspecified side: Secondary | ICD-10-CM | POA: Diagnosis not present

## 2014-11-17 DIAGNOSIS — D631 Anemia in chronic kidney disease: Secondary | ICD-10-CM | POA: Diagnosis present

## 2014-11-17 DIAGNOSIS — Z66 Do not resuscitate: Secondary | ICD-10-CM | POA: Diagnosis present

## 2014-11-17 LAB — PROTEIN ELECTROPHORESIS, SERUM
A/G Ratio: 1.1 (ref 0.7–1.7)
Albumin ELP: 2.8 g/dL — ABNORMAL LOW (ref 2.9–4.4)
Alpha-1-Globulin: 0.3 g/dL (ref 0.0–0.4)
Alpha-2-Globulin: 0.6 g/dL (ref 0.4–1.0)
Beta Globulin: 0.8 g/dL (ref 0.7–1.3)
GLOBULIN, TOTAL: 2.6 g/dL (ref 2.2–3.9)
Gamma Globulin: 0.9 g/dL (ref 0.4–1.8)
TOTAL PROTEIN ELP: 5.4 g/dL — AB (ref 6.0–8.5)

## 2014-11-17 LAB — ANA W/REFLEX IF POSITIVE: Anti Nuclear Antibody(ANA): NEGATIVE

## 2014-11-17 LAB — GLUCOSE, CAPILLARY
GLUCOSE-CAPILLARY: 140 mg/dL — AB (ref 65–99)
GLUCOSE-CAPILLARY: 143 mg/dL — AB (ref 65–99)
GLUCOSE-CAPILLARY: 148 mg/dL — AB (ref 65–99)
Glucose-Capillary: 119 mg/dL — ABNORMAL HIGH (ref 65–99)
Glucose-Capillary: 152 mg/dL — ABNORMAL HIGH (ref 65–99)

## 2014-11-17 LAB — BASIC METABOLIC PANEL
Anion gap: 7 (ref 5–15)
BUN: 8 mg/dL (ref 6–20)
CALCIUM: 8.6 mg/dL — AB (ref 8.9–10.3)
CO2: 26 mmol/L (ref 22–32)
CREATININE: 0.98 mg/dL (ref 0.44–1.00)
Chloride: 91 mmol/L — ABNORMAL LOW (ref 101–111)
GFR calc Af Amer: >60 mL/min (ref >60)
GFR calc non Af Amer: 56 mL/min — ABNORMAL LOW (ref >60)
GLUCOSE: 136 mg/dL — AB (ref 65–99)
Potassium: 3.7 mmol/L (ref 3.5–5.1)
Sodium: 124 mmol/L — ABNORMAL LOW (ref 135–145)

## 2014-11-17 LAB — SODIUM
SODIUM: 122 mmol/L — AB (ref 135–145)
Sodium: 124 mmol/L — ABNORMAL LOW (ref 135–145)

## 2014-11-17 MED ORDER — TOLVAPTAN 15 MG PO TABS
15.0000 mg | ORAL_TABLET | ORAL | Status: DC
  Administered 2014-11-17 – 2014-11-18 (×2): 15 mg via ORAL
  Filled 2014-11-17 (×2): qty 1

## 2014-11-17 MED ORDER — FUROSEMIDE 10 MG/ML IJ SOLN: 40.0000 mg | Freq: Once | INTRAMUSCULAR | Status: DC

## 2014-11-17 MED ORDER — SODIUM CHLORIDE 0.9 % IV SOLN
INTRAVENOUS | Status: AC
  Administered 2014-11-17 – 2014-11-18 (×2): via INTRAVENOUS

## 2014-11-17 NOTE — Progress Notes (Signed)
[facility]  ROUNDING NOTE   Subjective:  Patient resting at bedside. Sodium remains quite low at 124 despite IV fluid hydration. She could potentially have underlying SIADH.   Objective:  Vital signs in last 24 hours:  Temp:  [97.4 F (36.3 C)-98.7 F (37.1 C)] 98.7 F (37.1 C) (11/14 0545) Pulse Rate:  [72-77] 73 (11/14 0545) Resp:  [18-20] 18 (11/14 0545) BP: (129-139)/(63-98) 129/65 mmHg (11/14 0545) SpO2:  [100 %] 100 % (11/14 0545)  Weight change:  Filed Weights   11/13/14 1606  Weight: 99.791 kg (220 lb)    Intake/Output: I/O last 3 completed shifts: In: 614.2 [I.V.:614.2] Out: 150 [Urine:150]   Intake/Output this shift:     Physical Exam: General: NAD, resting in bed  Head: Normocephalic, atraumatic. Moist oral mucosal membranes  Eyes: Anicteric  Neck: Supple, trachea midline  Lungs:  Clear to auscultation normal effort  Heart: Regular rate and rhythm  Abdomen:  Soft, nontender, BS present  Extremities:  trace peripheral edema.  Neurologic: Nonfocal, moving all four extremities  Skin: No lesions       Basic Metabolic Panel:  Recent Labs Lab 11/13/14 1709 11/14/14 0551 11/15/14 0754 11/16/14 0503 11/17/14 0842  NA 126* 126* 126* 127* 124*  K 3.1* 3.6 3.9 3.9 3.7  CL 92* 89* 93* 94* 91*  CO2 28 28 25 26 26   GLUCOSE 93 90 99 123* 136*  BUN 17 20 14 12 8   CREATININE 1.66* 1.58* 1.20* 1.17* 0.98  CALCIUM 8.9 8.8* 8.5* 8.6* 8.6*    Liver Function Tests: No results for input(s): AST, ALT, ALKPHOS, BILITOT, PROT, ALBUMIN in the last 168 hours. No results for input(s): LIPASE, AMYLASE in the last 168 hours. No results for input(s): AMMONIA in the last 168 hours.  CBC:  Recent Labs Lab 11/13/14 1709 11/14/14 0551  WBC 8.2 7.1  NEUTROABS 5.8  --   HGB 11.2* 10.7*  HCT 32.8* 31.5*  MCV 86.2 85.1  PLT 171 170    Cardiac Enzymes:  Recent Labs Lab 11/13/14 1709 11/14/14 0037 11/14/14 0551  TROPONINI 0.04* 0.04* 0.03     BNP: Invalid input(s): POCBNP  CBG:  Recent Labs Lab 11/16/14 1615 11/16/14 2127 11/17/14 0309 11/17/14 0721 11/17/14 1118  GLUCAP 151* 150* 140* 119* 152*    Microbiology: Results for orders placed or performed during the hospital encounter of 11/13/14  MRSA PCR Screening     Status: None   Collection Time: 11/14/14  3:15 AM  Result Value Ref Range Status   MRSA by PCR NEGATIVE NEGATIVE Final    Comment:        The GeneXpert MRSA Assay (FDA approved for NASAL specimens only), is one component of a comprehensive MRSA colonization surveillance program. It is not intended to diagnose MRSA infection nor to guide or monitor treatment for MRSA infections.     Coagulation Studies: No results for input(s): LABPROT, INR in the last 72 hours.  Urinalysis: No results for input(s): COLORURINE, LABSPEC, PHURINE, GLUCOSEU, HGBUR, BILIRUBINUR, KETONESUR, PROTEINUR, UROBILINOGEN, NITRITE, LEUKOCYTESUR in the last 72 hours.  Invalid input(s): APPERANCEUR    Imaging: US Renal  11/16/2014  CLINICAL DATA:  74 year old female with acute renal failure. EXAM: RENAL / URINARY TRACT ULTRASOUND COMPLETE COMPARISON:  None. FINDINGS: Right Kidney: Length: 9.4 cm. Renal echogenicity is upper limits of normal. Mild cortical thinning noted. There is no evidence of hydronephrosis or solid renal mass. Left Kidney: Length: 9.1 cm. Renal echogenicity is upper limits of normal. Mild cortical  thinning noted. There is no evidence of hydronephrosis or solid renal mass. Bladder: Appears normal for degree of bladder distention. IMPRESSION: Upper limits of normal renal echogenicity which may reflect medical renal disease. Mild bilateral renal cortical thinning. No evidence of hydronephrosis. Electronically Signed   By: Margarette Canada M.D.   On: 11/16/2014 15:37     Medications:   . sodium chloride 50 mL/hr at 11/17/14 1143   . aspirin EC  81 mg Oral Daily  . atorvastatin  80 mg Oral QHS  .  brimonidine  1 drop Both Eyes BID  . citalopram  20 mg Oral Daily  . enoxaparin (LOVENOX) injection  40 mg Subcutaneous Q24H  . feeding supplement (GLUCERNA SHAKE)  237 mL Oral Q24H  . latanoprost  1 drop Both Eyes QHS  . pantoprazole  40 mg Oral QAC breakfast  . sodium chloride  3 mL Intravenous Q12H  . timolol  1 drop Both Eyes BID   acetaminophen **OR** acetaminophen, ondansetron **OR** ondansetron (ZOFRAN) IV  Assessment/ Plan:  74 y.o. female with a PMHx of diabetes mellitus type 2, non-Hodgkin's lymphoma status post stem cell transplant 2009, hypertension, history of coronary artery disease with myocardial infarction, TIA, spinal stenosis, hyperlipidemia, and GERD, who was admitted to [facility] on 11/13/2014 for evaluation of altered mental status secondary to hypoglycemia.   1. Acute renal failure, improved. 2. CKD stage III baseline Cr 1.1 egfr 54. 3. Hyponatremia. 4. Anemia of CKD.   Plan: The patient's acute renal failure has resolved at this point in time. However hyponatremia persists. Patient could potentially have underlying SIADH.  The patient had recent CT scan which showed possible aspiration pneumonia which could lead to SIADH.  There was also an enlarging left supraclavicular lymph node noted in the setting of patient's non-Hodgkin's lymphoma status post stem cell transplant. We will consult with oncology given the left supraclavicular node. To treat the hyponatremia we will administer tolvaptan 15 mg by mouth 1. We will stop IVF hydration as this appears to be worsening hyponatremia. Further plan as patient progresses.   LOS: 0 [name] 11/14/20161:51 PM

## 2014-11-17 NOTE — Progress Notes (Signed)
[facility] [place] NAME: [name]    MR#:  [id number]  DATE OF BIRTH:  Mar 20, 1940  SUBJECTIVE: confused today. Says she  feels nauseous.   CHIEF COMPLAINT:   Chief Complaint  Patient presents with  . Hypoglycemia    REVIEW OF SYSTEMS:   Review of Systems  Constitutional: Negative for fever and chills.  HENT: Negative for hearing loss.   Eyes: Negative for blurred vision, double vision and photophobia.  Respiratory: Negative for hemoptysis and shortness of breath.   Cardiovascular: Negative for palpitations, orthopnea and leg swelling.  Gastrointestinal: Negative for heartburn, nausea, vomiting, abdominal pain and diarrhea.  Genitourinary: Negative for dysuria and urgency.  Musculoskeletal: Negative for myalgias and neck pain.  Skin: Negative for rash.  Neurological: Negative for dizziness, focal weakness, seizures, weakness and headaches.  Endo/Heme/Allergies: Does not bruise/bleed easily.  Psychiatric/Behavioral: Negative for memory loss. The patient does not have insomnia.     DRUG ALLERGIES:   Allergies  Allergen Reactions  . Penicillins Hives    VITALS:  Blood pressure 129/65, pulse 73, temperature 98.7 F (37.1 C), temperature source Oral, resp. rate 18, height 5\' 10"  (1.778 m), weight 99.791 kg (220 lb), SpO2 100 %.  PHYSICAL EXAMINATION:  GENERAL:  74 y.o.-year-old patient lying in the bed with no acute distress.  EYES: Pupils equal, round, reactive to light and accommodation. No scleral icterus. Extraocular muscles intact.  HEENT: Head atraumatic, normocephalic. Oropharynx and nasopharynx clear.  NECK:  Supple, no jugular venous distention. No thyroid enlargement, no tenderness.  LUNGS: Normal breath sounds bilaterally, no wheezing, rales,rhonchi or crepitation. No use of accessory muscles of respiration.  CARDIOVASCULAR: S1, S2 normal. No murmurs, rubs, or gallops.  ABDOMEN: Soft, nontender,  nondistended. Bowel sounds present. No organomegaly or mass.  EXTREMITIES: No pedal edema, cyanosis, or clubbing.  NEUROLOGIC: Cranial nerves II through XII are intact. Muscle strength 5/5 in all extremities. Sensation intact. Gait not checked.  PSYCHIATRIC: The patient is alert and oriented x 3.  SKIN: No obvious rash, lesion, or ulcer.    LABORATORY PANEL:   CBC  Recent Labs Lab 11/14/14 0551  WBC 7.1  HGB 10.7*  HCT 31.5*  PLT 170   ------------------------------------------------------------------------------------------------------------------  Chemistries   Recent Labs Lab 11/17/14 0842  NA 124*  K 3.7  CL 91*  CO2 26  GLUCOSE 136*  BUN 8  CREATININE 0.98  CALCIUM 8.6*   ------------------------------------------------------------------------------------------------------------------  Cardiac Enzymes  Recent Labs Lab 11/14/14 0551  TROPONINI 0.03   ------------------------------------------------------------------------------------------------------------------  RADIOLOGY:  Dg Chest 1 View  11/15/2014  CLINICAL DATA:  Cough. History of diabetes and non-Hodgkin's lymphoma as well as hypertension. Former smoker. EXAM: CHEST 1 VIEW COMPARISON:  10/11/2013 FINDINGS: Cardiac silhouette normal in size and configuration. No mediastinal or hilar masses or evidence of adenopathy. Clear lungs.  No pleural effusion or pneumothorax. Bony thorax is demineralized but grossly intact. IMPRESSION: No acute cardiopulmonary disease. Electronically Signed   By: Lajean Manes M.D.   On: 11/15/2014 11:35   US Renal  11/16/2014  CLINICAL DATA:  74 year old female with acute renal failure. EXAM: RENAL / URINARY TRACT ULTRASOUND COMPLETE COMPARISON:  None. FINDINGS: Right Kidney: Length: 9.4 cm. Renal echogenicity is upper limits of normal. Mild cortical thinning noted. There is no evidence of hydronephrosis or solid renal mass. Left Kidney: Length: 9.1 cm. Renal echogenicity is  upper limits of normal. Mild cortical thinning noted. There is no evidence of hydronephrosis or solid renal  mass. Bladder: Appears normal for degree of bladder distention. IMPRESSION: Upper limits of normal renal echogenicity which may reflect medical renal disease. Mild bilateral renal cortical thinning. No evidence of hydronephrosis. Electronically Signed   By: Margarette Canada M.D.   On: 11/16/2014 15:37    EKG:   Orders placed or performed during the hospital encounter of 11/13/14  . EKG 12-Lead  . EKG 12-Lead    ASSESSMENT AND PLAN:    1. Acute encephalopathy secondary to hypoglycemia, hyponatremia: Encephalopathy resolved. The patient is alert and oriented. . Continue to hold  Lantus. ,   Recurrent hypoglycemia: Secondary to poor by mouth intake, poor renal clearance of Lantus due to his chronic kidney disease stage III. Continue to encourage by mouth intake, check sugars every 3 hours, add Glucerna. Hold Lantus.  #2. hyponatremia secondary to dehydration; and volume depletion. patient's sodium at 124. Continue IV hydration as per nephrology #3 .deconditioning, generalized weakness: physical therapy recommends SNF placement.  #4. Chronic kidney disease stage III; Acute  On CKD  stage3; prerenal secondary to hypotension with ATN;renal function is improving. #5. 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 #6. TIA; continue statins, aspirin. #7 history of glaucoma continue eyedrops. #8 .depression continue Celexa 20 mg daily 9.Stage II pressure ulcers to the buttocks: Present on admission. Continue dressing changes as per protocol. GI, DVT prophylaxis. snf placement  Likely discharge in 1-2 days. DNR    All the records are reviewed and case discussed with Care Management/Social Workerr. Management plans discussed with the patient, family and they are in agreement.  CODE STATUS: DNR  TOTAL TIME TAKING CARE OF THIS PATIENT: 35 minutes.   POSSIBLE D/C IN 1-2  DAYS, DEPENDING ON CLINICAL CONDITION.   Epifanio Lesches M.D on 11/17/2014 at 10:30 AM  Between 7am to 6pm - Pager - [phone]  After 6pm go to [email or web address] - password [email or web address] [facility]  Office  [phone]  CC: Primary care physician; Pcp Not In System   Note: This dictation was prepared with Dragon dictation along with smaller phrase technology. Any transcriptional errors that result from this process are unintentional.

## 2014-11-17 NOTE — Plan of Care (Signed)
Problem: Skin Integrity: Goal: Risk for impaired skin integrity will decrease Outcome: Progressing Patient repositioned and kept dry to reduce pressure to heels and sacral area. Dressings dry and intact.  Problem: Nutrition: Goal: Adequate nutrition will be maintained Outcome: Progressing Patient continues to have poor appetite, encourage PO intake, offer dietary choices.

## 2014-11-18 DIAGNOSIS — E162 Hypoglycemia, unspecified: Secondary | ICD-10-CM

## 2014-11-18 DIAGNOSIS — R4182 Altered mental status, unspecified: Secondary | ICD-10-CM

## 2014-11-18 DIAGNOSIS — Z8572 Personal history of non-Hodgkin lymphomas: Secondary | ICD-10-CM

## 2014-11-18 LAB — BASIC METABOLIC PANEL
Anion gap: 6 (ref 5–15)
BUN: 6 mg/dL (ref 6–20)
CALCIUM: 8.5 mg/dL — AB (ref 8.9–10.3)
CHLORIDE: 94 mmol/L — AB (ref 101–111)
CO2: 25 mmol/L (ref 22–32)
CREATININE: 1.02 mg/dL — AB (ref 0.44–1.00)
GFR, EST NON AFRICAN AMERICAN: 53 mL/min — AB (ref >60)
Glucose, Bld: 131 mg/dL — ABNORMAL HIGH (ref 65–99)
POTASSIUM: 3.8 mmol/L (ref 3.5–5.1)
SODIUM: 125 mmol/L — AB (ref 135–145)

## 2014-11-18 LAB — GLUCOSE, CAPILLARY
Glucose-Capillary: 127 mg/dL — ABNORMAL HIGH (ref 65–99)
Glucose-Capillary: 121 mg/dL — ABNORMAL HIGH (ref 65–99)
Glucose-Capillary: 174 mg/dL — ABNORMAL HIGH (ref 65–99)
Glucose-Capillary: 191 mg/dL — ABNORMAL HIGH (ref 65–99)
Glucose-Capillary: 206 mg/dL — ABNORMAL HIGH (ref 65–99)

## 2014-11-18 LAB — SODIUM
SODIUM: 125 mmol/L — AB (ref 135–145)
Sodium: 124 mmol/L — ABNORMAL LOW (ref 135–145)

## 2014-11-18 MED ORDER — GUAIFENESIN-DM 100-10 MG/5ML PO SYRP
5.0000 mL | ORAL_SOLUTION | ORAL | Status: DC | PRN
  Administered 2014-11-18 – 2014-11-19 (×2): 5 mL via ORAL
  Filled 2014-11-18 (×2): qty 5

## 2014-11-18 MED ORDER — TRAZODONE HCL 50 MG PO TABS
25.0000 mg | ORAL_TABLET | Freq: Every day | ORAL | Status: DC
  Administered 2014-11-18 – 2014-11-19 (×2): 25 mg via ORAL
  Filled 2014-11-18: qty 2
  Filled 2014-11-18: qty 1

## 2014-11-18 NOTE — Progress Notes (Signed)
[facility]  ROUNDING NOTE   Subjective:  Sodium slightly up to 125. Patient remains on ADH antagonist. Family currently at bedside. Family informed about the new left supraclavicular lymph node.   Objective:  Vital signs in last 24 hours:  Temp:  [97.9 F (36.6 C)-98.5 F (36.9 C)] 97.9 F (36.6 C) (11/15 0506) Pulse Rate:  [73-78] 73 (11/15 0506) Resp:  [18-20] 18 (11/15 0506) BP: (127-165)/(67-82) 146/67 mmHg (11/15 0506) SpO2:  [100 %] 100 % (11/15 0506)  Weight change:  Filed Weights   11/13/14 1606  Weight: 99.791 kg (220 lb)    Intake/Output: I/O last 3 completed shifts: In: 907.5 [I.V.:907.5] Out: -    Intake/Output this shift:  Total I/O In: -  Out: 1 [Stool:1]  Physical Exam: General: NAD, resting in bed  Head: Normocephalic, atraumatic. Moist oral mucosal membranes  Eyes: Anicteric  Neck: Supple, trachea midline  Lungs:  Clear to auscultation normal effort  Heart: Regular rate and rhythm  Abdomen:  Soft, nontender, BS present  Extremities:  trace peripheral edema.  Neurologic: Nonfocal, moving all four extremities  Skin: No lesions       Basic Metabolic Panel:  Recent Labs Lab 11/14/14 0551 11/15/14 0754 11/16/14 0503 11/17/14 0842 11/17/14 1446 11/17/14 1949 11/18/14 0217 11/18/14 0748  NA 126* 126* 127* 124* 124* 122* 125* 125*  K 3.6 3.9 3.9 3.7  --   --  3.8  --   CL 89* 93* 94* 91*  --   --  94*  --   CO2 28 25 26 26   --   --  25  --   GLUCOSE 90 99 123* 136*  --   --  131*  --   BUN 20 14 12 8   --   --  6  --   CREATININE 1.58* 1.20* 1.17* 0.98  --   --  1.02*  --   CALCIUM 8.8* 8.5* 8.6* 8.6*  --   --  8.5*  --     Liver Function Tests: No results for input(s): AST, ALT, ALKPHOS, BILITOT, PROT, ALBUMIN in the last 168 hours. No results for input(s): LIPASE, AMYLASE in the last 168 hours. No results for input(s): AMMONIA in the last 168 hours.  CBC:  Recent Labs Lab 11/13/14 1709 11/14/14 0551  WBC 8.2  7.1  NEUTROABS 5.8  --   HGB 11.2* 10.7*  HCT 32.8* 31.5*  MCV 86.2 85.1  PLT 171 170    Cardiac Enzymes:  Recent Labs Lab 11/13/14 1709 11/14/14 0037 11/14/14 0551  TROPONINI 0.04* 0.04* 0.03    BNP: Invalid input(s): POCBNP  CBG:  Recent Labs Lab 11/17/14 1612 11/17/14 2130 11/18/14 0249 11/18/14 0720 11/18/14 1114  GLUCAP 143* 148* 127* 121* 174*    Microbiology: Results for orders placed or performed during the hospital encounter of 11/13/14  MRSA PCR Screening     Status: None   Collection Time: 11/14/14  3:15 AM  Result Value Ref Range Status   MRSA by PCR NEGATIVE NEGATIVE Final    Comment:        The GeneXpert MRSA Assay (FDA approved for NASAL specimens only), is one component of a comprehensive MRSA colonization surveillance program. It is not intended to diagnose MRSA infection nor to guide or monitor treatment for MRSA infections.     Coagulation Studies: No results for input(s): LABPROT, INR in the last 72 hours.  Urinalysis: No results for input(s): COLORURINE, LABSPEC, [place], [place], [place], [place], [place], [place],  UROBILINOGEN, NITRITE, LEUKOCYTESUR in the last 72 hours.  Invalid input(s): APPERANCEUR    Imaging: US Renal  11/16/2014  CLINICAL DATA:  74 year old female with acute renal failure. EXAM: RENAL / URINARY TRACT ULTRASOUND COMPLETE COMPARISON:  None. FINDINGS: Right Kidney: Length: 9.4 cm. Renal echogenicity is upper limits of normal. Mild cortical thinning noted. There is no evidence of hydronephrosis or solid renal mass. Left Kidney: Length: 9.1 cm. Renal echogenicity is upper limits of normal. Mild cortical thinning noted. There is no evidence of hydronephrosis or solid renal mass. Bladder: Appears normal for degree of bladder distention. IMPRESSION: Upper limits of normal renal echogenicity which may reflect medical renal disease. Mild bilateral renal cortical thinning. No evidence of hydronephrosis.  Electronically Signed   By: Margarette Canada M.D.   On: 11/16/2014 15:37     Medications:     . aspirin EC  81 mg Oral Daily  . atorvastatin  80 mg Oral QHS  . brimonidine  1 drop Both Eyes BID  . citalopram  20 mg Oral Daily  . enoxaparin (LOVENOX) injection  40 mg Subcutaneous Q24H  . feeding supplement (GLUCERNA SHAKE)  237 mL Oral Q24H  . latanoprost  1 drop Both Eyes QHS  . pantoprazole  40 mg Oral QAC breakfast  . sodium chloride  3 mL Intravenous Q12H  . timolol  1 drop Both Eyes BID  . tolvaptan  15 mg Oral Q24H   acetaminophen **OR** acetaminophen, ondansetron **OR** ondansetron (ZOFRAN) IV  Assessment/ Plan:  74 y.o. female with a PMHx of diabetes mellitus type 2, non-Hodgkin's lymphoma status post stem cell transplant 2009, hypertension, history of coronary artery disease with myocardial infarction, TIA, spinal stenosis, hyperlipidemia, and GERD, who was admitted to [facility] on 11/13/2014 for evaluation of altered mental status secondary to hypoglycemia.   1. Acute renal failure, improved. 2. CKD stage III baseline Cr 1.1 egfr 54. 3. Hyponatremia. 4. Anemia of CKD.  5.  Left supraclavicular lymph node in setting of prior hx of non Hodgkins Lymphoma.   Plan: We will continue the patient on tolvaptan at this point in time. 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  We are awaiting further input from oncology given the new left supraclavicular lymph node. We will continue to monitor serum sodium closely while the patient remains on tolvaptan. Further plan as patient progresses.  LOS: 1 Neftaly Inzunza 11/15/201612:05 PM

## 2014-11-18 NOTE — Care Management Important Message (Signed)
Important Message  Patient Details  Name: ALEXANDREA CLENNON MRN: [id number] Date of Birth: 15-Oct-1940   Medicare Important Message Given:  Yes    Shelbie Ammons, RN 11/18/2014, 8:22 AM

## 2014-11-18 NOTE — Progress Notes (Signed)
Physical Therapy Treatment Patient Details Name: [name] MRN: [id number] DOB: 17-Jun-1940 Today's Date: 11/18/2014    History of Present Illness presented to ER secondary to unresponsive episode with noted hypoglycemia (FSBS 30s); admitted with acute encephalopathy.  Of note, patient with recent hospitalization (discharge 11/1) secondary to acute CVA with L-sided weakness.    PT Comments    Patient with improved alertness and command-following this date.  Able to maintain unsupported sitting balance edge of bed, but does demonstrate increased sway in all directions.  L UE ataxia with reaching activities.  Fatigues quickly; requires frequent redirection and encouragement.   Follow Up Recommendations  SNF     Equipment Recommendations       Recommendations for Other Services       Precautions / Restrictions Precautions Precautions: Fall Precaution Comments: isolation Restrictions Weight Bearing Restrictions: No    Mobility  Bed Mobility Overal bed mobility: Needs Assistance Bed Mobility: Supine to Sit;Sit to Supine     Supine to sit: Mod assist;Max assist Sit to supine: Min assist   General bed mobility comments: difficulty problem-solving and sequencing movement transition  Transfers                 General transfer comment: Unsafe/unable  Ambulation/Gait             General Gait Details: Unsafe/unable   Stairs            Wheelchair Mobility    Modified Rankin (Stroke Patients Only)       Balance Overall balance assessment: Needs assistance Sitting-balance support: No upper extremity supported;Feet supported Sitting balance-Leahy Scale: Fair Sitting balance - Comments: improved sitting balance, min assist, this date                            Cognition Arousal/Alertness: Awake/alert   Overall Cognitive Status:  (improved from previous session--oriented to self, location, month and year; follows simple, one-step  commands; poor STM persists)                      Exercises Other Exercises Other Exercises: Unsupported sitting balance edge of bed, min assist for multidirectional sway.  Able to maintain briefly with periods of close sup.  Participated with alternate UE reaching tasks to challenge righting reactions and return to midline; remains with significant L UE ataxia, difficulty with anti-gravity elevation.  Fatigues quickly; frequent redirection and encouragement.  Attempted partial lift-off from bed surface with bilat UE/LEs in closed-chain; assist for foot position and closed-chain WBing.  Unable to clear buttocks    General Comments        Pertinent Vitals/Pain Pain Assessment: No/denies pain    Home Living                      Prior Function            PT Goals (current goals can now be found in the care plan section) Acute Rehab PT Goals Patient Stated Goal: wants to go home PT Goal Formulation: Patient unable to participate in goal setting Time For Goal Achievement: 11/28/14 Potential to Achieve Goals: Fair    Frequency  Min 2X/week    PT Plan      Co-evaluation             End of Session   Activity Tolerance: Patient limited by lethargy Patient left: in bed;with call bell/phone within reach;with bed alarm  set;with family/visitor present     Time: [id number] PT Time Calculation (min) (ACUTE ONLY): 25 min  Charges:  $Therapeutic Activity: 8-22 mins $Neuromuscular Re-education: 8-22 mins                    G Codes:      Nasreen Goedecke H. Owens Shark, PT, DPT, NCS 11/18/2014, 3:58 PM [phone]

## 2014-11-18 NOTE — Consult Note (Signed)
[place]  [phone] [phone]  ID: [name] OB: [birth year]  MR#: [id number]  [id number]  Patient Care Team: Pcp Not In System as PCP - General  CHIEF COMPLAINT:  Chief Complaint  Patient presents with  . Hypoglycemia    INTERVAL HISTORY: Patient recently admitted to the hospital with altered mental status and hypoglycemia. Prior to admission, she had a CT scan on October 31, 2014 that revealed suspicious lymphadenopathy. She was last evaluated in the [facility] in July 2014. 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 Patient stated she noticed a lump in her left neck/supraclavicular area for "many months."  By report, patient is being discharged tomorrow to a rehabilitation facility. Currently, she offers no specific complaints.  REVIEW OF SYSTEMS:   Review of Systems  Constitutional: Positive for malaise/fatigue. Negative for fever.  Respiratory: Negative.   Cardiovascular: Negative.   Gastrointestinal: Negative.   Musculoskeletal: Negative.   Neurological: Positive for weakness. to a rehabilitation facility. Currently, she offers no specific complaints.  REVIEW OF SYSTEMS:   Review of Systems  Constitutional: Positive for malaise/fatigue. Negative for fever.  Respiratory: Negative.   Cardiovascular: Negative.   Gastrointestinal: Negative.   Musculoskeletal: Negative.   Neurological: Positive for weakness.    As per HPI. Otherwise, a complete review of systems is negatve.  PAST MEDICAL HISTORY: Past Medical History  Diagnosis Date  . Diabetes mellitus without complication ([place])   . Arthritis   . Non Hodgkin's lymphoma ([place]) 2009    s/p stem cell transplant  . Hypertension   . MI (myocardial infarction) ([place])     1990s  . TIA (transient ischemic attack)   . Spinal stenosis   . HLD (hyperlipidemia)   . GERD (gastroesophageal reflux disease)     PAST SURGICAL HISTORY: Past Surgical History  Procedure Laterality Date  . Abdominal hysterectomy    . Toe amputation Left   . Eye surgery Bilateral     FAMILY HISTORY Family History  Problem Relation Age of Onset  . Hypertension Mother   . Heart disease Mother   . Diabetes Father   . Breast cancer Sister        ADVANCED  DIRECTIVES:    HEALTH MAINTENANCE: Social History  Substance Use Topics  . Smoking status: Former Research scientist (life sciences)  . Smokeless tobacco: None  . Alcohol Use: No     Colonoscopy:  PAP:  Bone density:  Lipid panel:  Allergies  Allergen Reactions  . Penicillins Hives    Current Facility-Administered Medications  Medication Dose Route Frequency Provider Last Rate Last Dose  . acetaminophen (TYLENOL) tablet 650 mg  650 mg Oral Q6H PRN Lance Coon, MD   650 mg at 11/18/14 0802   Or  . acetaminophen (TYLENOL) suppository 650 mg  650 mg Rectal Q6H PRN Lance Coon, MD      . aspirin EC tablet 81 mg  81 mg Oral Daily Lance Coon, MD   81 mg at 11/18/14 0803  . atorvastatin (LIPITOR) tablet 80 mg  80 mg Oral QHS Lance Coon, MD   80 mg at 11/17/14 2113  . brimonidine (ALPHAGAN) 0.2 % ophthalmic solution 1 drop  1 drop Both Eyes BID Lance Coon, MD   1 drop at 11/18/14 0805  . citalopram (CELEXA) tablet 20 mg  20 mg Oral Daily Lance Coon, MD   20 mg at 11/18/14 0803  . enoxaparin (LOVENOX) injection 40 mg  40 mg Subcutaneous Q24H Epifanio Lesches, MD   40 mg at 11/17/14 2113  . feeding supplement (GLUCERNA SHAKE) (GLUCERNA SHAKE) liquid 237 mL  237 mL Oral Q24H Epifanio Lesches, MD   237 mL at 11/16/14 1518  . latanoprost (XALATAN) 0.005 % ophthalmic solution  1 drop  1 drop Both Eyes QHS Lance Coon, MD   1 drop at 11/17/14 2114  . ondansetron (ZOFRAN) tablet 4 mg  4 mg Oral Q6H PRN Lance Coon, MD       Or  . ondansetron [facility]) injection 4 mg  4 mg Intravenous Q6H PRN Lance Coon, MD      . pantoprazole (PROTONIX) EC tablet 40 mg  40 mg Oral QAC breakfast Lance Coon, MD   40 mg at 11/18/14 0803  . sodium chloride 0.9 % injection 3 mL  3 mL Intravenous Q12H Lance Coon, MD   3 mL at 11/17/14 2114  . timolol (TIMOPTIC) 0.5 % ophthalmic solution 1 drop  1 drop Both Eyes BID Lance Coon, MD   1 drop at 11/18/14 0806  . tolvaptan (SAMSCA) tablet 15 mg  15 mg Oral Q24H Munsoor  Lateef, MD   15 mg at 11/18/14 1345    OBJECTIVE: Filed Vitals:   11/18/14 0506  BP: 146/67  Pulse: 73  Temp: 97.9 F (36.6 C)  Resp: 18     Body mass index is 31.57 kg/(m^2).    ECOG FS:1 - Symptomatic but completely ambulatory  General: Well-developed, well-nourished, no acute distress. Eyes: Pink conjunctiva, anicteric sclera. HEENT: Normocephalic, moist mucous membranes, clear oropharnyx. Lungs: Clear to auscultation bilaterally. Heart: Regular rate and rhythm. No rubs, murmurs, or gallops. Abdomen: Soft, nontender, nondistended. No organomegaly noted, normoactive bowel sounds. Musculoskeletal: No edema, cyanosis, or clubbing. Neuro: Alert, answering all questions appropriately. Cranial nerves grossly intact. Skin: No rashes or petechiae noted. Psych: Normal affect. Lymphatics: Minimally palpable supraclavicular mass.  LAB RESULTS:  Lab Results  Component Value Date   NA 124* 11/18/2014   K 3.8 11/18/2014   CL 94* 11/18/2014   CO2 25 11/18/2014   GLUCOSE 131* 11/18/2014   BUN 6 11/18/2014   CREATININE 1.02* 11/18/2014   CALCIUM 8.5* 11/18/2014   PROT 6.9 10/31/2014   ALBUMIN 3.8 10/31/2014   AST 41 10/31/2014   ALT 18 10/31/2014   ALKPHOS 67 10/31/2014   BILITOT 1.2 10/31/2014   GFRNONAA 53* 11/18/2014   GFRAA >60 11/18/2014    Lab Results  Component Value Date   WBC 7.1 11/14/2014   NEUTROABS 5.8 11/13/2014   HGB 10.7* 11/14/2014   HCT 31.5* 11/14/2014   MCV 85.1 11/14/2014   PLT 170 11/14/2014     STUDIES: Ct Abdomen Pelvis Wo Contrast  10/31/2014  CLINICAL DATA:  Found down, left upper quadrant abdominal pain, history of non-Hodgkin's lymphoma EXAM: CT CHEST, ABDOMEN AND PELVIS WITHOUT CONTRAST TECHNIQUE: Multidetector CT imaging of the chest, abdomen and pelvis was performed following the standard protocol without IV contrast. COMPARISON:  02/23/2012 FINDINGS: CT CHEST FINDINGS Mediastinum/Nodes: The heart is top-normal in size. No pericardial  effusion. Coronary atherosclerosis. Atherosclerotic calcifications of the aortic arch. Dominant 17 mm short axis left supraclavicular node, new, suspicious for lymphomatous recurrence given the patient's clinical history. Additional prominent thoracic lymph nodes, new/progressed from 2014, including: --12 mm short axis left supraclavicular node (series 2/ image 12) --6 mm short axis prevascular node (series 2/ image 20) --8 mm short axis AP window node (series 2/ image 22) --8 mm short axis subcarinal node (series 2/ image 26) Visualized thyroid is unremarkable. Lungs/Pleura: Evaluation of the lung parenchyma is constrained by respiratory motion. Patchy/nodular ground-glass opacities in the posterior right upper lobe and right lower lobe, suspicious for pneumonia, possibly on the basis of aspiration. Minimal dependent atelectasis at the lung bases, likely  atelectasis. Underlying mild centrilobular and paraseptal emphysematous changes. No pleural effusion or pneumothorax. Musculoskeletal: Degenerative changes of the thoracic spine. CT ABDOMEN PELVIS FINDINGS Motion degraded images. Hepatobiliary: Unenhanced liver is unremarkable. Layering small gallstones with in the gallbladder neck (series 2/image 36), without associated inflammatory changes. No intrahepatic or extrahepatic ductal dilatation. Pancreas: Within normal limits. Spleen: Normal in size. Adrenals/Urinary Tract: Adrenal glands are within normal limits. Kidneys are unremarkable. No renal, ureteral, or bladder calculi.  No hydronephrosis. Bladder is within normal limits. Stomach/Bowel: Stomach is within normal limits. No evidence of bowel obstruction. Normal appendix (series 2/image 95). Vascular/Lymphatic: No evidence of abdominal aortic aneurysm. Mildly prominent upper abdominal/retroperitoneal lymph nodes, new/progressed from 2014, including: --9 mm short axis gastrohepatic node (series 2/ image 51) --10 mm short axis node in the porta hepatis (series  2/image 23) --9 mm short axis portacaval node (series 2/ image 58) --9 mm short axis left para-aortic node (series 2/image 69) --8 mm short axis aortocaval node (series 2/image 69) Reproductive: Status post hysterectomy. No adnexal masses. Other: No abdominopelvic ascites. Musculoskeletal: Mild degenerative changes of the lumbar spine. IMPRESSION: Patchy/nodular ground-glass opacities in the posterior right upper lobe and right lower lobe, suspicious for pneumonia, possibly on the basis of aspiration. No evidence of bowel obstruction. Normal appendix. Cholelithiasis, without associated inflammatory changes. No CT findings to account for the patient's left upper quadrant abdominal pain. Dominant 17 mm short axis left supraclavicular node, new, suspicious for lymphomatous recurrence given the patient's history. Additional mildly prominent lymph nodes in the chest, abdomen, and pelvis, as above. Electronically Signed   By: Julian Hy M.D.   On: 10/31/2014 23:03   Dg Chest 1 View  11/15/2014  CLINICAL DATA:  Cough. History of diabetes and non-Hodgkin's lymphoma as well as hypertension. Former smoker. EXAM: CHEST 1 VIEW COMPARISON:  10/11/2013 FINDINGS: Cardiac silhouette normal in size and configuration. No mediastinal or hilar masses or evidence of adenopathy. Clear lungs.  No pleural effusion or pneumothorax. Bony thorax is demineralized but grossly intact. IMPRESSION: No acute cardiopulmonary disease. Electronically Signed   By: Lajean Manes M.D.   On: 11/15/2014 11:35   Ct Head Wo Contrast  10/31/2014  CLINICAL DATA:  Altered mental status.  Apparent fall EXAM: CT HEAD WITHOUT CONTRAST CT CERVICAL SPINE WITHOUT CONTRAST TECHNIQUE: Multidetector CT imaging of the head and cervical spine was performed following the standard protocol without intravenous contrast. Multiplanar CT image reconstructions of the cervical spine were also generated. COMPARISON:  Head CT October 29, 2014 FINDINGS: CT HEAD  FINDINGS Moderate diffuse atrophy is stable. There is no intracranial mass, hemorrhage, extra-axial fluid collection, or midline shift. Patchy small vessel disease in the centra semiovale bilaterally is stable. There is no new gray-white compartment lesion. No acute infarct. Bony calvarium appears intact. The mastoid air cells are clear. CT CERVICAL SPINE FINDINGS There is no fracture or appreciable spondylolisthesis. Prevertebral soft tissues and predental space regions are normal. There is moderate disc space narrowing at C5-6, C6-7, and C7-T1. There is facet hypertrophy at multiple levels bilaterally, most pronounced at C5-6 on the left. No frank disc extrusion or high-grade stenosis. IMPRESSION: CT head: Atrophy with mild periventricular small vessel disease. No intracranial mass, hemorrhage, or acute appearing infarct. CT cervical spine: Multilevel osteoarthritic change. No demonstrable fracture or spondylolisthesis. Electronically Signed   By: Lowella Grip III M.D.   On: 10/31/2014 22:50   Ct Head Wo Contrast  10/29/2014  CLINICAL DATA:  Altered mental status and left-sided weakness. History of  non-Hodgkin's lymphoma EXAM: CT HEAD WITHOUT CONTRAST TECHNIQUE: Contiguous axial images were obtained from the base of the skull through the vertex without intravenous contrast. COMPARISON:  None. FINDINGS: There is moderate diffuse atrophy. There is no intracranial mass, hemorrhage, extra-axial fluid collection, or midline shift. Patchy small vessel disease in the centra semiovale bilaterally. Elsewhere gray-white compartments appear normal. No acute infarct is evident. There is a 4 mm calcification at the level of the lateral aspect of the right anterior cerebral artery seen on axial slice 10 series 2, concerning for a small aneurysm. The attenuation of the middle cerebral arteries is symmetric bilaterally. The bony calvarium appears intact. The mastoid air cells are clear. IMPRESSION: Atrophy with patchy  periventricular small vessel disease. No mass, hemorrhage, or acute appearing infarct. Question 4 mm aneurysm arising from lateral aspect of the right anterior cerebral artery, calcified, seen on axial slice 10 series 2. Critical Value/emergent results were called by telephone at the time of interpretation on 10/29/2014 at 1:45 pm to Dr. Charlotte Crumb , who verbally acknowledged these results. Electronically Signed   By: Lowella Grip III M.D.   On: 10/29/2014 13:46   Ct Chest Wo Contrast  10/31/2014  CLINICAL DATA:  Found down, left upper quadrant abdominal pain, history of non-Hodgkin's lymphoma EXAM: CT CHEST, ABDOMEN AND PELVIS WITHOUT CONTRAST TECHNIQUE: Multidetector CT imaging of the chest, abdomen and pelvis was performed following the standard protocol without IV contrast. COMPARISON:  02/23/2012 FINDINGS: CT CHEST FINDINGS Mediastinum/Nodes: The heart is top-normal in size. No pericardial effusion. Coronary atherosclerosis. Atherosclerotic calcifications of the aortic arch. Dominant 17 mm short axis left supraclavicular node, new, suspicious for lymphomatous recurrence given the patient's clinical history. Additional prominent thoracic lymph nodes, new/progressed from 2014, including: --12 mm short axis left supraclavicular node (series 2/ image 12) --6 mm short axis prevascular node (series 2/ image 20) --8 mm short axis AP window node (series 2/ image 22) --8 mm short axis subcarinal node (series 2/ image 26) Visualized thyroid is unremarkable. Lungs/Pleura: Evaluation of the lung parenchyma is constrained by respiratory motion. Patchy/nodular ground-glass opacities in the posterior right upper lobe and right lower lobe, suspicious for pneumonia, possibly on the basis of aspiration. Minimal dependent atelectasis at the lung bases, likely atelectasis. Underlying mild centrilobular and paraseptal emphysematous changes. No pleural effusion or pneumothorax. Musculoskeletal: Degenerative changes of  the thoracic spine. CT ABDOMEN PELVIS FINDINGS Motion degraded images. Hepatobiliary: Unenhanced liver is unremarkable. Layering small gallstones with in the gallbladder neck (series 2/image 36), without associated inflammatory changes. No intrahepatic or extrahepatic ductal dilatation. Pancreas: Within normal limits. Spleen: Normal in size. Adrenals/Urinary Tract: Adrenal glands are within normal limits. Kidneys are unremarkable. No renal, ureteral, or bladder calculi.  No hydronephrosis. Bladder is within normal limits. Stomach/Bowel: Stomach is within normal limits. No evidence of bowel obstruction. Normal appendix (series 2/image 95). Vascular/Lymphatic: No evidence of abdominal aortic aneurysm. Mildly prominent upper abdominal/retroperitoneal lymph nodes, new/progressed from 2014, including: --9 mm short axis gastrohepatic node (series 2/ image 51) --10 mm short axis node in the porta hepatis (series 2/image 23) --9 mm short axis portacaval node (series 2/ image 58) --9 mm short axis left para-aortic node (series 2/image 69) --8 mm short axis aortocaval node (series 2/image 69) Reproductive: Status post hysterectomy. No adnexal masses. Other: No abdominopelvic ascites. Musculoskeletal: Mild degenerative changes of the lumbar spine. IMPRESSION: Patchy/nodular ground-glass opacities in the posterior right upper lobe and right lower lobe, suspicious for pneumonia, possibly on the basis of aspiration. No  evidence of bowel obstruction. Normal appendix. Cholelithiasis, without associated inflammatory changes. No CT findings to account for the patient's left upper quadrant abdominal pain. Dominant 17 mm short axis left supraclavicular node, new, suspicious for lymphomatous recurrence given the patient's history. Additional mildly prominent lymph nodes in the chest, abdomen, and pelvis, as above. Electronically Signed   By: Julian Hy M.D.   On: 10/31/2014 23:03   Ct Cervical Spine Wo Contrast  10/31/2014   CLINICAL DATA:  Altered mental status.  Apparent fall EXAM: CT HEAD WITHOUT CONTRAST CT CERVICAL SPINE WITHOUT CONTRAST TECHNIQUE: Multidetector CT imaging of the head and cervical spine was performed following the standard protocol without intravenous contrast. Multiplanar CT image reconstructions of the cervical spine were also generated. COMPARISON:  Head CT October 29, 2014 FINDINGS: CT HEAD FINDINGS Moderate diffuse atrophy is stable. There is no intracranial mass, hemorrhage, extra-axial fluid collection, or midline shift. Patchy small vessel disease in the centra semiovale bilaterally is stable. There is no new gray-white compartment lesion. No acute infarct. Bony calvarium appears intact. The mastoid air cells are clear. CT CERVICAL SPINE FINDINGS There is no fracture or appreciable spondylolisthesis. Prevertebral soft tissues and predental space regions are normal. There is moderate disc space narrowing at C5-6, C6-7, and C7-T1. There is facet hypertrophy at multiple levels bilaterally, most pronounced at C5-6 on the left. No frank disc extrusion or high-grade stenosis. IMPRESSION: CT head: Atrophy with mild periventricular small vessel disease. No intracranial mass, hemorrhage, or acute appearing infarct. CT cervical spine: Multilevel osteoarthritic change. No demonstrable fracture or spondylolisthesis. Electronically Signed   By: Lowella Grip III M.D.   On: 10/31/2014 22:50   US Renal  11/16/2014  CLINICAL DATA:  74 year old female with acute renal failure. EXAM: RENAL / URINARY TRACT ULTRASOUND COMPLETE COMPARISON:  None. FINDINGS: Right Kidney: Length: 9.4 cm. Renal echogenicity is upper limits of normal. Mild cortical thinning noted. There is no evidence of hydronephrosis or solid renal mass. Left Kidney: Length: 9.1 cm. Renal echogenicity is upper limits of normal. Mild cortical thinning noted. There is no evidence of hydronephrosis or solid renal mass. Bladder: Appears normal for degree of  bladder distention. IMPRESSION: Upper limits of normal renal echogenicity which may reflect medical renal disease. Mild bilateral renal cortical thinning. No evidence of hydronephrosis. Electronically Signed   By: Margarette Canada M.D.   On: 11/16/2014 15:37   US Carotid Bilateral  11/02/2014  CLINICAL DATA:  Facial droop, hypertension, TIA, diabetes. EXAM: BILATERAL CAROTID DUPLEX ULTRASOUND TECHNIQUE: Pearline Cables scale imaging, color Doppler and duplex ultrasound was performed of bilateral carotid and vertebral arteries in the neck. COMPARISON:  None. REVIEW OF SYSTEMS: Quantification of carotid stenosis is based on velocity parameters that correlate the residual internal carotid diameter with NASCET-based stenosis levels, using the diameter of the distal internal carotid lumen as the denominator for stenosis measurement. The following velocity measurements were obtained: PEAK SYSTOLIC/END DIASTOLIC RIGHT ICA:                     48/15cm/sec CCA:                     [id number] SYSTOLIC ICA/CCA RATIO:  0.9 DIASTOLIC ICA/CCA RATIO: 1.4 ECA:                     74cm/sec LEFT ICA:                     41/8cm/sec CCA:  [id number] SYSTOLIC ICA/CCA RATIO:  0.6 DIASTOLIC ICA/CCA RATIO: 0.8 ECA:                     61cm/sec FINDINGS: RIGHT CAROTID ARTERY: Intimal thickening through the common carotid artery. No significant plaque accumulation or stenosis. Normal waveforms or color Doppler signal. RIGHT VERTEBRAL ARTERY:  Normal flow direction and waveform. LEFT CAROTID ARTERY: Intimal thickening in the common carotid artery and bulb. No focal plaque accumulation or stenosis. Normal waveforms and color Doppler signal. LEFT VERTEBRAL ARTERY: Normal flow direction and waveform. IMPRESSION: 1. No significant carotid plaque or stenosis. Electronically Signed   By: Lucrezia Europe M.D.   On: 11/02/2014 13:52    ASSESSMENT: History of mantle cell lymphoma status post bone marrow transplant in 2009.  PLAN:    1.  Mantle cell lymphoma: CT scan results reviewed independently and reported as above with increasing lymphadenopathy that is suspicious for recurrence. No intervention is needed at this time. Once patient's acute issues resolve and her performance status improves, she can be evaluated in the [facility] for consideration of PET scan and biopsy if necessary. Will order peripheral blood flow cytometry for completeness. 2. Disposition: By report patient is being discharged tomorrow. Please have her follow-up in the [facility] in several weeks for further evaluation and continued diagnostic workup.  Appreciate consult, call with questions.  Patient expressed understanding and was in agreement with this plan. She also understands that She can call clinic at any time with any questions, concerns, or complaints.   No matching staging information was found for the patient.  Lloyd Huger, MD   11/18/2014 4:51 PM

## 2014-11-18 NOTE — Progress Notes (Signed)
[facility] [place] NAME: Eulala Ishaq    MR#:  [id number]  DATE OF BIRTH:  Sep 26, 1940  SUBJECTIVE:denies any complaint,eager to go home,  CHIEF COMPLAINT:   Chief Complaint  Patient presents with  . Hypoglycemia    REVIEW OF SYSTEMS:   Review of Systems  Constitutional: Negative for fever and chills.  HENT: Negative for hearing loss.   Eyes: Negative for blurred vision, double vision and photophobia.  Respiratory: Negative for hemoptysis and shortness of breath.   Cardiovascular: Negative for palpitations, orthopnea and leg swelling.  Gastrointestinal: Negative for heartburn, nausea, vomiting, abdominal pain and diarrhea.  Genitourinary: Negative for dysuria and urgency.  Musculoskeletal: Negative for myalgias and neck pain.  Skin: Negative for rash.  Neurological: Negative for dizziness, focal weakness, seizures, weakness and headaches.  Endo/Heme/Allergies: Does not bruise/bleed easily.  Psychiatric/Behavioral: Negative for memory loss. The patient does not have insomnia.     DRUG ALLERGIES:   Allergies  Allergen Reactions  . Penicillins Hives    VITALS:  Blood pressure 146/67, pulse 73, temperature 97.9 F (36.6 C), temperature source Oral, resp. rate 18, height 5\' 10"  (1.778 m), weight 99.791 kg (220 lb), SpO2 100 %.  PHYSICAL EXAMINATION:  GENERAL:  74 y.o.-year-old patient lying in the bed with no acute distress.  EYES: Pupils equal, round, reactive to light and accommodation. No scleral icterus. Extraocular muscles intact.  HEENT: Head atraumatic, normocephalic. Oropharynx and nasopharynx clear.  NECK:  Supple, no jugular venous distention. No thyroid enlargement, no tenderness.  LUNGS: Normal breath sounds bilaterally, no wheezing, rales,rhonchi or crepitation. No use of accessory muscles of respiration.  CARDIOVASCULAR: S1, S2 normal. No murmurs, rubs, or gallops.  ABDOMEN: Soft, nontender, nondistended.  Bowel sounds present. No organomegaly or mass.  EXTREMITIES: No pedal edema, cyanosis, or clubbing.  NEUROLOGIC: Cranial nerves II through XII are intact. Muscle strength 5/5 in all extremities. Sensation intact. Gait not checked.  PSYCHIATRIC: The patient is alert and oriented x 3.  SKIN: No obvious rash, lesion, or ulcer.    LABORATORY PANEL:   CBC  Recent Labs Lab 11/14/14 0551  WBC 7.1  HGB 10.7*  HCT 31.5*  PLT 170   ------------------------------------------------------------------------------------------------------------------  Chemistries   Recent Labs Lab 11/18/14 0217 11/18/14 0748  NA 125* 125*  K 3.8  --   CL 94*  --   CO2 25  --   GLUCOSE 131*  --   BUN 6  --   CREATININE 1.02*  --   CALCIUM 8.5*  --    ------------------------------------------------------------------------------------------------------------------  Cardiac Enzymes  Recent Labs Lab 11/14/14 0551  TROPONINI 0.03   ------------------------------------------------------------------------------------------------------------------  RADIOLOGY:  US Renal  11/16/2014  CLINICAL DATA:  74 year old female with acute renal failure. EXAM: RENAL / URINARY TRACT ULTRASOUND COMPLETE COMPARISON:  None. FINDINGS: Right Kidney: Length: 9.4 cm. Renal echogenicity is upper limits of normal. Mild cortical thinning noted. There is no evidence of hydronephrosis or solid renal mass. Left Kidney: Length: 9.1 cm. Renal echogenicity is upper limits of normal. Mild cortical thinning noted. There is no evidence of hydronephrosis or solid renal mass. Bladder: Appears normal for degree of bladder distention. IMPRESSION: Upper limits of normal renal echogenicity which may reflect medical renal disease. Mild bilateral renal cortical thinning. No evidence of hydronephrosis. Electronically Signed   By: Margarette Canada M.D.   On: 11/16/2014 15:37    EKG:   Orders placed or performed during the hospital encounter of  11/13/14  .  EKG 12-Lead  . EKG 12-Lead    ASSESSMENT AND PLAN:    1. Acute encephalopathy secondary to hypoglycemia, hyponatremia: Encephalopathy resolved. The patient is alert and oriented. . Continue to hold  Lantus. ,   Recurrent hypoglycemia: Secondary to poor by mouth intake, poor renal clearance of Lantus due to his chronic kidney disease stage III. Continue to encourage by mouth intake,   #2. hyponatremia secondary to SIADH;continue tolvaptan,sodium improved to 125.follow closely #3 .deconditioning, generalized weakness: physical therapy recommends SNF placement.  Possible discharge when the sodium improves. #4. Chronic kidney disease stage III; Acute  On CKD  stage3; prerenal secondary to hypotension with ATN;renal function is improving. #5. 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 #6. TIA; continue statins, aspirin. #7 history of glaucoma continue eyedrops. #8 .depression continue Celexa 20 mg daily 9.Stage II pressure ulcers to the buttocks: Present on admission. Continue dressing changes as per protocol. GI, DVT prophylaxis. snf placement  . DNR    All the records are reviewed and case discussed with Care Management/Social Workerr. Management plans discussed with the patient, family and they are in agreement.  CODE STATUS: DNR  TOTAL TIME TAKING CARE OF THIS PATIENT: 35 minutes.   POSSIBLE D/C IN 1-2 DAYS, DEPENDING ON CLINICAL CONDITION.   Epifanio Lesches M.D on 11/18/2014 at 11:20 AM  Between 7am to 6pm - Pager - [phone]  After 6pm go to [email or web address] - password [email or web address] [facility]  Office  [phone]  CC: Primary care physician; Pcp Not In System   Note: This dictation was prepared with Dragon dictation along with smaller phrase technology. Any transcriptional errors that result from this process are unintentional.

## 2014-11-18 NOTE — Plan of Care (Signed)
Problem: Nutrition: Goal: Adequate nutrition will be maintained Outcome: Progressing Sodium 125 today. Urine sent after in and out cath. No complaints of pain.

## 2014-11-19 DIAGNOSIS — M6281 Muscle weakness (generalized): Secondary | ICD-10-CM | POA: Insufficient documentation

## 2014-11-19 DIAGNOSIS — IMO0001 Reserved for inherently not codable concepts without codable children: Secondary | ICD-10-CM | POA: Insufficient documentation

## 2014-11-19 DIAGNOSIS — Z638 Other specified problems related to primary support group: Secondary | ICD-10-CM | POA: Insufficient documentation

## 2014-11-19 DIAGNOSIS — E871 Hypo-osmolality and hyponatremia: Secondary | ICD-10-CM | POA: Insufficient documentation

## 2014-11-19 DIAGNOSIS — R531 Weakness: Secondary | ICD-10-CM | POA: Insufficient documentation

## 2014-11-19 LAB — PROTEIN ELECTRO, RANDOM URINE
ALBUMIN ELP UR: 100 %
ALPHA-1-GLOBULIN, U: 0 %
ALPHA-2-GLOBULIN, U: 0 %
Beta Globulin, U: 0 %
Gamma Globulin, U: 0 %
TOTAL PROTEIN, URINE-UPE24: 4.6 mg/dL

## 2014-11-19 LAB — SODIUM
SODIUM: 125 mmol/L — AB (ref 135–145)
Sodium: 127 mmol/L — ABNORMAL LOW (ref 135–145)
Sodium: 127 mmol/L — ABNORMAL LOW (ref 135–145)

## 2014-11-19 LAB — GLUCOSE, CAPILLARY
GLUCOSE-CAPILLARY: 161 mg/dL — AB (ref 65–99)
GLUCOSE-CAPILLARY: 170 mg/dL — AB (ref 65–99)
GLUCOSE-CAPILLARY: 145 mg/dL — AB (ref 65–99)
Glucose-Capillary: 160 mg/dL — ABNORMAL HIGH (ref 65–99)
Glucose-Capillary: 160 mg/dL — ABNORMAL HIGH (ref 65–99)

## 2014-11-19 LAB — BASIC METABOLIC PANEL
ANION GAP: 7 (ref 5–15)
BUN: 8 mg/dL (ref 6–20)
CALCIUM: 8.6 mg/dL — AB (ref 8.9–10.3)
CO2: 25 mmol/L (ref 22–32)
Chloride: 94 mmol/L — ABNORMAL LOW (ref 101–111)
Creatinine, Ser: 1.07 mg/dL — ABNORMAL HIGH (ref 0.44–1.00)
GFR, EST AFRICAN AMERICAN: 58 mL/min — AB (ref >60)
GFR, EST NON AFRICAN AMERICAN: 50 mL/min — AB (ref >60)
Glucose, Bld: 156 mg/dL — ABNORMAL HIGH (ref 65–99)
POTASSIUM: 3.8 mmol/L (ref 3.5–5.1)
SODIUM: 126 mmol/L — AB (ref 135–145)

## 2014-11-19 MED ORDER — GLUCERNA SHAKE PO LIQD
237.0000 mL | Freq: Two times a day (BID) | ORAL | Status: DC
  Administered 2014-11-20 (×2): 237 mL via ORAL

## 2014-11-19 MED ORDER — INSULIN ASPART 100 UNIT/ML ~~LOC~~ SOLN
0.0000 [IU] | Freq: Three times a day (TID) | SUBCUTANEOUS | Status: DC
  Administered 2014-11-19: 17:00:00 2 [IU] via SUBCUTANEOUS
  Administered 2014-11-20: 1 [IU] via SUBCUTANEOUS
  Administered 2014-11-20: 13:00:00 2 [IU] via SUBCUTANEOUS
  Administered 2014-11-20: 18:00:00 1 [IU] via SUBCUTANEOUS
  Filled 2014-11-19 (×2): qty 1
  Filled 2014-11-19 (×2): qty 2

## 2014-11-19 MED ORDER — TOLVAPTAN 15 MG PO TABS
30.0000 mg | ORAL_TABLET | ORAL | Status: DC
  Administered 2014-11-20: 13:00:00 30 mg via ORAL
  Filled 2014-11-19: qty 2

## 2014-11-19 NOTE — Progress Notes (Signed)
[facility]  ROUNDING NOTE   Subjective:  Pt seen at bedside.  Na about the same at 126 at present.  Appreciate note from oncology.    Objective:  Vital signs in last 24 hours:  Temp:  [97.8 F (36.6 C)-98.2 F (36.8 C)] 97.8 F (36.6 C) (11/16 0604) Pulse Rate:  [67-73] 73 (11/16 0604) BP: (111-138)/(54-66) 138/66 mmHg (11/16 0604) SpO2:  [98 %-99 %] 98 % (11/16 0604)  Weight change:  Filed Weights   11/13/14 1606  Weight: 99.791 kg (220 lb)    Intake/Output: I/O last 3 completed shifts: In: 907.5 [I.V.:907.5] Out: 2 [Urine:1; Stool:1]   Intake/Output this shift:     Physical Exam: General: NAD, resting in bed  Head: Normocephalic, atraumatic. Moist oral mucosal membranes  Eyes: Anicteric  Neck: Supple, trachea midline  Lungs:  Clear to auscultation normal effort  Heart: Regular rate and rhythm  Abdomen:  Soft, nontender, BS present  Extremities:  trace peripheral edema.  Neurologic: Nonfocal, moving all four extremities  Skin: No lesions       Basic Metabolic Panel:  Recent Labs Lab 11/15/14 0754 11/16/14 0503 11/17/14 0842  11/18/14 0217 11/18/14 0748 11/18/14 1445 11/18/14 2350 11/19/14 0515  NA 126* 127* 124*  < > 125* 125* 124* 125* 126*  K 3.9 3.9 3.7  --  3.8  --   --   --  3.8  CL 93* 94* 91*  --  94*  --   --   --  94*  CO2 25 26 26   --  25  --   --   --  25  GLUCOSE 99 123* 136*  --  131*  --   --   --  156*  BUN 14 12 8   --  6  --   --   --  8  CREATININE 1.20* 1.17* 0.98  --  1.02*  --   --   --  1.07*  CALCIUM 8.5* 8.6* 8.6*  --  8.5*  --   --   --  8.6*  < > = values in this interval not displayed.  Liver Function Tests: No results for input(s): AST, ALT, ALKPHOS, BILITOT, PROT, ALBUMIN in the last 168 hours. No results for input(s): LIPASE, AMYLASE in the last 168 hours. No results for input(s): AMMONIA in the last 168 hours.  CBC:  Recent Labs Lab 11/13/14 1709 11/14/14 0551  WBC 8.2 7.1  NEUTROABS 5.8  --    HGB 11.2* 10.7*  HCT 32.8* 31.5*  MCV 86.2 85.1  PLT 171 170    Cardiac Enzymes:  Recent Labs Lab 11/13/14 1709 11/14/14 0037 11/14/14 0551  TROPONINI 0.04* 0.04* 0.03    BNP: Invalid input(s): POCBNP  CBG:  Recent Labs Lab 11/18/14 1624 11/18/14 2128 11/19/14 0346 11/19/14 0741 11/19/14 1127  GLUCAP 191* 206* 160* 160* 97*    Microbiology: Results for orders placed or performed during the hospital encounter of 11/13/14  MRSA PCR Screening     Status: None   Collection Time: 11/14/14  3:15 AM  Result Value Ref Range Status   MRSA by PCR NEGATIVE NEGATIVE Final    Comment:        The GeneXpert MRSA Assay (FDA approved for NASAL specimens only), is one component of a comprehensive MRSA colonization surveillance program. It is not intended to diagnose MRSA infection nor to guide or monitor treatment for MRSA infections.     Coagulation Studies: No results for input(s): LABPROT, INR  in the last 72 hours.  Urinalysis: No results for input(s): COLORURINE, LABSPEC, PHURINE, GLUCOSEU, HGBUR, BILIRUBINUR, KETONESUR, PROTEINUR, UROBILINOGEN, NITRITE, LEUKOCYTESUR in the last 72 hours.  Invalid input(s): APPERANCEUR    Imaging: No results found.   Medications:     . aspirin EC  81 mg Oral Daily  . atorvastatin  80 mg Oral QHS  . brimonidine  1 drop Both Eyes BID  . citalopram  20 mg Oral Daily  . enoxaparin (LOVENOX) injection  40 mg Subcutaneous Q24H  . feeding supplement (GLUCERNA SHAKE)  237 mL Oral Q24H  . insulin aspart  0-9 Units Subcutaneous TID WC  . latanoprost  1 drop Both Eyes QHS  . pantoprazole  40 mg Oral QAC breakfast  . sodium chloride  3 mL Intravenous Q12H  . timolol  1 drop Both Eyes BID  . tolvaptan  15 mg Oral Q24H  . traZODone  25 mg Oral QHS   acetaminophen **OR** acetaminophen, guaiFENesin-dextromethorphan, ondansetron **OR** ondansetron (ZOFRAN) IV  Assessment/ Plan:  74 y.o. female with a PMHx of diabetes mellitus  type 2, non-Hodgkin's lymphoma status post stem cell transplant 2009, hypertension, history of coronary artery disease with myocardial infarction, TIA, spinal stenosis, hyperlipidemia, and GERD, who was admitted to [facility] on 11/13/2014 for evaluation of altered mental status secondary to hypoglycemia.   1. Acute renal failure, improved. 2. CKD stage III baseline Cr 1.1 egfr 54. 3. Hyponatremia. 4. Anemia of CKD.  5.  Left supraclavicular lymph node in setting of prior hx of non Hodgkins Lymphoma.   Plan: Sodium not increasing as hoped. Increase tolvaptan to 184m po daily.  Renal function appears to be at baseline at the moment.   Continue to monitor serum sodium while on tolvaptan.  Appreciate input from Dr. FGrayland Ormondregarding supraclavicular lymph node.  Will need PET as outpt.      LOS: 2 [name] 11/16/20162:36 PM

## 2014-11-19 NOTE — Progress Notes (Signed)
Nutrition Follow-up   INTERVENTION:   Meals and Snacks: Cater to patient preferences Medical Food Supplement Therapy: will increase Glucerna to BID Coordination of Care: recommend new weight   NUTRITION DIAGNOSIS:   Increased nutrient needs related to wound healing as evidenced by estimated needs.  GOAL:   Patient will meet greater than or equal to 90% of their needs  MONITOR:    (Energy Intake, Electrolyte and Renal Profile, Skin, Anthropometrics, Digestive System)  REASON FOR ASSESSMENT:    (Pressure Ulcer)    ASSESSMENT:   Pt admitted with AMS and acute encephalopathy. Pt with recent admission for the same one week ago. Pt with multiple stage II pressure ulcers.   Pt was scheduled for possible discharge today however unable secondary to hyponatremia. Nephrology following.  Oncology following for enlarged lymph node and h/o mantle cell lymphoma, workup outpt for PET scan and biopsy.  Diet Order:  Diet heart healthy/carb modified Room service appropriate?: Yes; Fluid consistency:: Thin    Current Nutrition: Recorded po intake 50% of meals. PT working with pt on rounds this am.   Gastrointestinal Profile: Last BM: 11/15 loose stool   Scheduled Medications:  . aspirin EC  81 mg Oral Daily  . atorvastatin  80 mg Oral QHS  . brimonidine  1 drop Both Eyes BID  . citalopram  20 mg Oral Daily  . enoxaparin (LOVENOX) injection  40 mg Subcutaneous Q24H  . feeding supplement (GLUCERNA SHAKE)  237 mL Oral Q24H  . insulin aspart  0-9 Units Subcutaneous TID WC  . latanoprost  1 drop Both Eyes QHS  . pantoprazole  40 mg Oral QAC breakfast  . sodium chloride  3 mL Intravenous Q12H  . timolol  1 drop Both Eyes BID  . [START ON 11/20/2014] tolvaptan  30 mg Oral Q24H  . traZODone  25 mg Oral QHS     Electrolyte/Renal Profile and Glucose Profile:   Recent Labs Lab 11/17/14 0842  11/18/14 0217  11/18/14 1445 11/18/14 2350 11/19/14 0515  NA 124*  < > 125*  < > 124*  125* 126*  K 3.7  --  3.8  --   --   --  3.8  CL 91*  --  94*  --   --   --  94*  CO2 26  --  25  --   --   --  25  BUN 8  --  6  --   --   --  8  CREATININE 0.98  --  1.02*  --   --   --  1.07*  CALCIUM 8.6*  --  8.5*  --   --   --  8.6*  GLUCOSE 136*  --  131*  --   --   --  156*  < > = values in this interval not displayed. Protein Profile: No results for input(s): ALBUMIN in the last 168 hours.    Weight Trend since Admission: Filed Weights   11/13/14 1606  Weight: 220 lb (99.791 kg)     Skin:   (Stage II heel and sacral pressure ulcers)   BMI:  Body mass index is 31.57 kg/(m^2).  Estimated Nutritional Needs:   Kcal:  using IBW of 68kg, BEE: 1265kcals, TEE; (IF 1.2-1.4)(AF 1.2) 1821-2125kcals  Protein:  81-102g protein (1.2-1.5g/kg)  Fluid:  17-2043mL of fluid (25-19mL/kg)  EDUCATION NEEDS:   No education needs identified at this time   [place], RD, LDN Pager [phone]

## 2014-11-19 NOTE — Clinical Social Work Note (Signed)
Per MD, pt will likely be discharge tomorrow. CSW updated facility. CSW will continue to follow.   Darden Dates, MSW, LCSW Clinical Social Worker  [phone]

## 2014-11-19 NOTE — Progress Notes (Signed)
[facility] [place] NAME: [name]    MR#:  [id number]  DATE OF BIRTH:  Apr 27, 1940  SUBJECTIVE:  CHIEF COMPLAINT:   Chief Complaint  Patient presents with  . Hypoglycemia    REVIEW OF SYSTEMS:   Review of Systems  Constitutional: Negative for fever and chills.  HENT: Negative for hearing loss.   Eyes: Negative for blurred vision, double vision and photophobia.  Respiratory: Negative for hemoptysis and shortness of breath.   Cardiovascular: Negative for palpitations, orthopnea and leg swelling.  Gastrointestinal: Negative for heartburn, nausea, vomiting, abdominal pain and diarrhea.  Genitourinary: Negative for dysuria and urgency.  Musculoskeletal: Negative for myalgias and neck pain.  Skin: Negative for rash.  Neurological: Negative for dizziness, focal weakness, seizures, weakness and headaches.  Endo/Heme/Allergies: Does not bruise/bleed easily.  Psychiatric/Behavioral: Negative for memory loss. The patient does not have insomnia.     DRUG ALLERGIES:   Allergies  Allergen Reactions  . Penicillins Hives    VITALS:  Blood pressure 138/66, pulse 73, temperature 97.8 F (36.6 C), temperature source Oral, resp. rate 18, height 5\' 10"  (1.778 m), weight 99.791 kg (220 lb), SpO2 98 %.  PHYSICAL EXAMINATION:  GENERAL:  74 y.o.-year-old patient lying in the bed with no acute distress.  EYES: Pupils equal, round, reactive to light and accommodation. No scleral icterus. Extraocular muscles intact.  HEENT: Head atraumatic, normocephalic. Oropharynx and nasopharynx clear.  NECK:  Supple, no jugular venous distention. No thyroid enlargement, no tenderness.  LUNGS: Normal breath sounds bilaterally, no wheezing, rales,rhonchi or crepitation. No use of accessory muscles of respiration.  CARDIOVASCULAR: S1, S2 normal. No murmurs, rubs, or gallops.  ABDOMEN: Soft, nontender, nondistended. Bowel sounds present. No organomegaly or  mass.  EXTREMITIES: No pedal edema, cyanosis, or clubbing.  NEUROLOGIC: Cranial nerves II through XII are intact. Muscle strength 5/5 in all extremities. Sensation intact. Gait not checked.  PSYCHIATRIC: The patient is alert and oriented x 3.  SKIN: No obvious rash, lesion, or ulcer.    LABORATORY PANEL:   CBC  Recent Labs Lab 11/14/14 0551  WBC 7.1  HGB 10.7*  HCT 31.5*  PLT 170   ------------------------------------------------------------------------------------------------------------------  Chemistries   Recent Labs Lab 11/19/14 0515  NA 126*  K 3.8  CL 94*  CO2 25  GLUCOSE 156*  BUN 8  CREATININE 1.07*  CALCIUM 8.6*   ------------------------------------------------------------------------------------------------------------------  Cardiac Enzymes  Recent Labs Lab 11/14/14 0551  TROPONINI 0.03   ------------------------------------------------------------------------------------------------------------------  RADIOLOGY:  No results found.  EKG:   Orders placed or performed during the hospital encounter of 11/13/14  . EKG 12-Lead  . EKG 12-Lead    ASSESSMENT AND PLAN:    1. Acute encephalopathy secondary to hypoglycemia, hyponatremia: Encephalopathy resolved. The patient is alert and oriented. Use sliding scale with coverage only secondary to poor by mouth intake. Continue to hold Lantus. ,    #2. hyponatremia secondary to SIADH;continue tolvaptan, sodium still low at 126 unable to discharge today secondary to hyponatremia. #3 .deconditioning, generalized weakness: physical therapy recommends SNF placement.  Possible discharge when the sodium improves. #4. Chronic kidney disease stage III; Acute  On CKD  stage3; prerenal secondary to hypotension with ATN;renal function is improving. #5. 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 At supraclavicular lymph node, hyponatremia oncology was consulted and patient had history of  mantle cell lymphoma. Needs a workup in cancer  Center as an outpatient for a PET scan and biopsy. #6.  TIA; continue statins, aspirin. #7 history of glaucoma continue eyedrops. #8 .depression continue Celexa 20 mg daily 9.Stage II pressure ulcers to the buttocks: Present on admission. Continue dressing changes as per protocol. GI, DVT prophylaxis. snf placement , patient will go to [facility], . DNR    All the records are reviewed and case discussed with Care Management/Social Workerr. Management plans discussed with the patient, family and they are in agreement.  CODE STATUS: DNR  TOTAL TIME TAKING CARE OF THIS PATIENT: 35 minutes.   POSSIBLE D/C IN 1-2 DAYS, DEPENDING ON CLINICAL CONDITION.   Epifanio Lesches M.D on 11/19/2014 at 2:11 PM  Between 7am to 6pm - Pager - [phone]  After 6pm go to [email or web address] - password [email or web address] [facility]  Office  [phone]  CC: Primary care physician; Pcp Not In System   Note: This dictation was prepared with Dragon dictation along with smaller phrase technology. Any transcriptional errors that result from this process are unintentional.

## 2014-11-19 NOTE — Progress Notes (Signed)
Patient had no c/o pain this shift VSS Sodium up to 127 this shift Family at bedside  BS 160-170 this shift Continues on aspirin  [facility] is followed with [facility] and is agreeable to placement with [facility] in [place]

## 2014-11-19 NOTE — Clinical Social Work Note (Signed)
Clinical Social Work Assessment  Patient Details  Name: [name] MRN: [id number] Date of Birth: 1940/12/07  Date of referral:  11/18/14               Reason for consult:  Facility Placement                Permission sought to share information with:  Family Supports Permission granted to share information::  Yes, Verbal Permission Granted  Name::      [name], daughter)   Housing/Transportation Living arrangements for the past 2 months:  [place] of Information:  Patient Patient Interpreter Needed:  None Criminal Activity/Legal Involvement Pertinent to Current Situation/Hospitalization:  No - Comment as needed Significant Relationships:  None Lives with:  Self Do you feel safe going back to the place where you live?  Yes Need for family participation in patient care:  Yes (Comment)  Care giving concerns:  Pt lives alone and needs a higher level of care prior to returning home.   Social Worker assessment / plan:  CSW met with pt to address consult. PT is recommending SNF. [facility] is a [facility] pt. Per [facility], [facility] at [facility] as [facility] does not have a female bed available.   CSW spoke with pt, introduced herself and explained role of social work. CSW also explained the process of going to SNF. Pt is agreeable to placement at [facility]. With pt's permission, CSW attempted to call pt's daughter [name], however there was no answer or voicemail.   CSW spoke with admissions coordinator at [facility] and a bed is available and they are able to accept pt. CSW initiated referral. CSW will continue to follow to facilitate discharge.   Employment status:  Retired Nurse, adult PT Recommendations:  [place] / Referral to community resources:  Other (Comment Required) ([facility])  Patient/Family's Response to care:   Pt was appreciative of CSW support.    Patient/Family's Understanding of and Emotional Response to Diagnosis, Current Treatment, and Prognosis:  Pt understands that she will benefit from a higher level of care.  Emotional Assessment Appearance:  Appears older than stated age Attitude/Demeanor/Rapport:  Other (Appropriate) Affect (typically observed):  Pleasant Orientation:  Oriented to Self, Oriented to Place, Oriented to Situation Alcohol / Substance use:  Other (possible history for tobacco) Psych involvement (Current and /or in the community):  No (Comment)  Discharge Needs  Concerns to be addressed:  No discharge needs identified Readmission within the last 30 days:  No Current discharge risk:  None Barriers to Discharge:  No Barriers Identified   Darden Dates, LCSW 11/19/2014, 11:09 AM

## 2014-11-19 NOTE — Plan of Care (Signed)
Problem: Nutrition: Goal: Adequate nutrition will be maintained Outcome: Progressing Plan of care, progress of goals. 1. Pt with frequent stooling.  Gently cleaned with medicated wipes and barrier cream for moisture related contact dermatitis. 2. Pt remains safe with no falls this admission. 3. Encouraged po intake but declined dinner. 4. C/o cough, sore throat, and unable to sleep.  Called for new order and given robitussin and trazadone. 5. Anticipate discharge to assisted living Wednesday. Dorna Bloom RN

## 2014-11-19 NOTE — NC FL2 (Signed)
[place] LEVEL OF CARE SCREENING TOOL     IDENTIFICATION  Patient Name: [name] Birthdate: 1940/11/11 Sex: female Admission Date (Current Location): 11/13/2014  [place] and [place] Number:  Set designer )   Facility and Address:  [facility], [address] [place], [place] [address]      Provider Number: [id number]  Attending Physician Name and Address:  Epifanio Lesches, MD  Relative Name and Phone Number:       Current Level of Care: Hospital Recommended Level of Care: [place] Prior Approval Number:    Date Approved/Denied:   PASRR Number: [id number]  Discharge Plan: SNF    Current Diagnoses: Patient Active Problem List   Diagnosis Date Noted  . Hyponatremia 11/17/2014  . Pressure ulcer 11/14/2014  . Acute kidney injury superimposed on chronic kidney disease ([place]) 11/01/2014  . Acute encephalopathy 10/31/2014  . Aspiration pneumonia ([place]) 10/31/2014  . Hypoglycemia 10/31/2014  . HTN (hypertension) 10/31/2014  . Type 2 diabetes mellitus ([place]) 10/31/2014  . CAD (coronary artery disease) 10/31/2014  . DDD (degenerative disc disease), lumbar 06/24/2014  . Facet syndrome, lumbar 06/24/2014  . Spinal stenosis, lumbar region, with neurogenic claudication 06/24/2014  . Neuropathy due to secondary diabetes ([place]) 06/24/2014    Orientation ACTIVITIES/SOCIAL BLADDER RESPIRATION    Self, Time, Situation, Place  Active   Normal  BEHAVIORAL SYMPTOMS/MOOD NEUROLOGICAL BOWEL NUTRITION STATUS        Diet (Heart Healthy/Carb Modified, Thin Liquids (extra gravy on meats))  PHYSICIAN VISITS COMMUNICATION OF NEEDS Height & Weight Skin  30 days     220 lbs. Normal          AMBULATORY STATUS RESPIRATION    Assist extensive Normal      Personal Care Assistance Level of Assistance  Bathing, Feeding, Dressing Bathing Assistance: Limited assistance Feeding assistance: Limited assistance Dressing Assistance:  Limited assistance      Functional Limitations [place]  PT (By licensed PT), OT (By licensed OT)                   Additional Factors Info  Code Status, Allergies Code Status Info: DNR Allergies Info: Penicillins           Current Medications (11/19/2014): Current Facility-Administered Medications  Medication Dose Route Frequency Provider Last Rate Last Dose  . acetaminophen (TYLENOL) tablet 650 mg  650 mg Oral Q6H PRN Lance Coon, MD   650 mg at 11/18/14 0802   Or  . acetaminophen (TYLENOL) suppository 650 mg  650 mg Rectal Q6H PRN Lance Coon, MD      . aspirin EC tablet 81 mg  81 mg Oral Daily Lance Coon, MD   81 mg at 11/19/14 1002  . atorvastatin (LIPITOR) tablet 80 mg  80 mg Oral QHS Lance Coon, MD   80 mg at 11/18/14 2027  . brimonidine (ALPHAGAN) 0.2 % ophthalmic solution 1 drop  1 drop Both Eyes BID Lance Coon, MD   1 drop at 11/19/14 1002  . citalopram (CELEXA) tablet 20 mg  20 mg Oral Daily Lance Coon, MD   20 mg at 11/19/14 1002  . enoxaparin (LOVENOX) injection 40 mg  40 mg Subcutaneous Q24H Epifanio Lesches, MD   40 mg at 11/18/14 2027  . feeding supplement (GLUCERNA SHAKE) (GLUCERNA SHAKE) liquid 237 mL  237 mL Oral Q24H Epifanio Lesches, MD  237 mL at 11/18/14 1748  . guaiFENesin-dextromethorphan (ROBITUSSIN DM) 100-10 MG/5ML syrup 5 mL  5 mL Oral Q4H PRN Lance Coon, MD   5 mL at 11/19/14 0556  . latanoprost (XALATAN) 0.005 % ophthalmic solution 1 drop  1 drop Both Eyes QHS Lance Coon, MD   1 drop at 11/18/14 2028  . ondansetron (ZOFRAN) tablet 4 mg  4 mg Oral Q6H PRN Lance Coon, MD       Or  . ondansetron [facility]) injection 4 mg  4 mg Intravenous Q6H PRN Lance Coon, MD      . pantoprazole (PROTONIX) EC tablet 40 mg  40 mg Oral QAC breakfast Lance Coon, MD   40 mg at 11/19/14 1002  . sodium chloride 0.9 % injection 3 mL  3 mL Intravenous Q12H Lance Coon, MD   3 mL at 11/18/14 2348   . timolol (TIMOPTIC) 0.5 % ophthalmic solution 1 drop  1 drop Both Eyes BID Lance Coon, MD   1 drop at 11/19/14 1002  . tolvaptan (SAMSCA) tablet 15 mg  15 mg Oral Q24H Munsoor Lateef, MD   15 mg at 11/18/14 1345  . traZODone (DESYREL) tablet 25 mg  25 mg Oral QHS Lance Coon, MD   25 mg at 11/18/14 2346   Do not use this list as official medication orders. Please verify with discharge summary.  Discharge Medications:   Medication List    ASK your doctor about these medications        acetaminophen 500 MG tablet  Commonly known as:  TYLENOL  Take 500 mg by mouth every 8 (eight) hours as needed.     amLODipine 5 MG tablet  Commonly known as:  NORVASC  Take 5 mg by mouth daily.     aspirin EC 81 MG tablet  Take 81 mg by mouth daily.     atorvastatin 80 MG tablet  Commonly known as:  LIPITOR  Take 80 mg by mouth daily.     chlorthalidone 25 MG tablet  Commonly known as:  HYGROTON  Take 25 mg by mouth daily.     citalopram 20 MG tablet  Commonly known as:  CELEXA  Take 20 mg by mouth daily.     COMBIGAN 0.2-0.5 % ophthalmic solution  Generic drug:  brimonidine-timolol  Place 1 drop into both eyes every 12 (twelve) hours.     DESITIN 13 % Crea  Generic drug:  Zinc Oxide  Apply topically.     docusate sodium 100 MG capsule  Commonly known as:  COLACE  Take 100 mg by mouth at bedtime as needed for mild constipation.     LANTUS SOLOSTAR 100 UNIT/ML Solostar Pen  Generic drug:  Insulin Glargine  Inject 60 Units into the skin daily.     lisinopril 40 MG tablet  Commonly known as:  PRINIVIL,ZESTRIL  Take 40 mg by mouth daily.     omeprazole 20 MG capsule  Commonly known as:  PRILOSEC  Take 20 mg by mouth daily.     OPTIVE 0.5-0.9 % Soln  Generic drug:  Carboxymethylcellul-Glycerin  Apply 1-2 drops to eye 3 (three) times daily as needed.     polyethylene glycol packet  Commonly known as:  MIRALAX / GLYCOLAX  Take 17 g by mouth daily as needed.      Travoprost (BAK Free) 0.004 % Soln ophthalmic solution  Commonly known as:  TRAVATAN  Place 1 drop into both eyes.     trolamine salicylate 10 % cream  Commonly known as:  ASPERCREME  Apply 1 application topically as needed for muscle pain (left hip).     Vitamin D (Ergocalciferol) 50000 UNITS Caps capsule  Commonly known as:  DRISDOL  Take 50,000 Units by mouth every 30 (thirty) days.        Relevant Imaging Results:  Relevant Lab Results:  Recent Labs    Additional Information  Contact isolation for MRSA  Darden Dates, LCSW

## 2014-11-20 DIAGNOSIS — R591 Generalized enlarged lymph nodes: Secondary | ICD-10-CM | POA: Insufficient documentation

## 2014-11-20 LAB — BASIC METABOLIC PANEL
ANION GAP: 6 (ref 5–15)
ANION GAP: 9 (ref 5–15)
BUN: 7 mg/dL (ref 6–20)
BUN: 7 mg/dL (ref 6–20)
CALCIUM: 8.8 mg/dL — AB (ref 8.9–10.3)
CALCIUM: 8.9 mg/dL (ref 8.9–10.3)
CHLORIDE: 97 mmol/L — AB (ref 101–111)
CO2: 27 mmol/L (ref 22–32)
CO2: 25 mmol/L (ref 22–32)
CREATININE: 1.21 mg/dL — AB (ref 0.44–1.00)
Chloride: 96 mmol/L — ABNORMAL LOW (ref 101–111)
Creatinine, Ser: 1.15 mg/dL — ABNORMAL HIGH (ref 0.44–1.00)
GFR calc Af Amer: 50 mL/min — ABNORMAL LOW (ref >60)
GFR calc non Af Amer: 46 mL/min — ABNORMAL LOW (ref >60)
GFR calc non Af Amer: 43 mL/min — ABNORMAL LOW (ref >60)
GFR, EST AFRICAN AMERICAN: 53 mL/min — AB (ref >60)
GLUCOSE: 157 mg/dL — AB (ref 65–99)
Glucose, Bld: 138 mg/dL — ABNORMAL HIGH (ref 65–99)
Potassium: 3.7 mmol/L (ref 3.5–5.1)
Potassium: 3.7 mmol/L (ref 3.5–5.1)
SODIUM: 130 mmol/L — AB (ref 135–145)
Sodium: 130 mmol/L — ABNORMAL LOW (ref 135–145)

## 2014-11-20 LAB — GLUCOSE, CAPILLARY
GLUCOSE-CAPILLARY: 139 mg/dL — AB (ref 65–99)
Glucose-Capillary: 133 mg/dL — ABNORMAL HIGH (ref 65–99)
Glucose-Capillary: 170 mg/dL — ABNORMAL HIGH (ref 65–99)

## 2014-11-20 LAB — SODIUM: Sodium: 129 mmol/L — ABNORMAL LOW (ref 135–145)

## 2014-11-20 MED ORDER — COLLAGENASE 250 UNIT/GM EX OINT
TOPICAL_OINTMENT | Freq: Every day | CUTANEOUS | Status: DC
  Filled 2014-11-20: qty 30

## 2014-11-20 MED ORDER — FUROSEMIDE 40 MG PO TABS: 40.0000 mg | ORAL_TABLET | Freq: Every day | ORAL | Status: AC

## 2014-11-20 MED ORDER — SODIUM CHLORIDE 1 G PO TABS: 1.0000 g | ORAL_TABLET | Freq: Two times a day (BID) | ORAL | Status: AC

## 2014-11-20 MED ORDER — INSULIN ASPART 100 UNIT/ML ~~LOC~~ SOLN: 0.0000 [IU] | Freq: Three times a day (TID) | SUBCUTANEOUS | Status: AC

## 2014-11-20 NOTE — Consult Note (Signed)
WOC wound consult note Reason for Consult:multiple pressure injuries, present on admission.  Discharging today.   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  Wound type:Pressure injury Pressure Ulcer POA: Yes Measurement: Right lateral heel 4 cm x 3.4 cm dark black center with surrounding gray adherent tissue. Foul drainage.  Wound [id number] tissue Drainage (amount, consistency, odor) Minimal serosanguinous drainage.  Musty odor.  Periwound:INtact Dressing procedure/placement/frequency:Cleanse right heel ulcer with NS and pat gently dry.  Apply Santyl ointment to wound bed.  Cover with NS moist gauze.  Secure with 4x4 gauze and kerlix.  Change daily.  Send ointment with patient on discharge.  Will not follow at this time.  Please re-consult if needed.  Domenic Moras RN BSN [place] Pager [phone]

## 2014-11-20 NOTE — Progress Notes (Signed)
Patient had no c/o pain this shift  VSS Patient was lethargic in early am but improved as shift progress  Sodium up to 130 Wound Nurse saw patient and new orders for left heel wound care entered  Received MD order to discharge patient to [facility] in [place] report called to Elayne Guerin RN Awaiting EMS to pick up patient at end of shift

## 2014-11-20 NOTE — Discharge Summary (Signed)
[name], is a 74 y.o. female  DOB [birth year]  MRN [id number].  Admission date:  11/13/2014  Admitting Physician  Lance Coon, MD  Discharge Date:  11/20/2014   Primary MD  Pcp Not In System  Recommendations for primary care physician for things to follow:  Primary doctor in 1 week   Admission Diagnosis  Hypoglycemia [E16.2]   Discharge Diagnosis  Hypoglycemia [E16.2]   Principal Problem:   Acute encephalopathy Active Problems:   Hypoglycemia   HTN (hypertension)   Type 2 diabetes mellitus (HCC)   CAD (coronary artery disease)   Acute kidney injury superimposed on chronic kidney disease (HCC)   Pressure ulcer   Hyponatremia      Past Medical History  Diagnosis Date  . Diabetes mellitus without complication ([place])   . Arthritis   . 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 Hypertension   . MI (myocardial infarction) ([place])     1990s  . TIA (transient ischemic attack)   . Spinal stenosis   . HLD (hyperlipidemia)   . GERD (gastroesophageal reflux disease)     Past Surgical History  Procedure Laterality Date  . Abdominal hysterectomy    . Toe amputation Left   . Eye surgery Bilateral        History of present illness and  Hospital Course:     Kindly see H&P for history of present illness and admission details, please review complete Labs, Consult reports and Test reports for all details in brief  HPI  from the history and physical done on the day of admission  74 year old female patient admitted for acute encephalopathy. Low sugar of 20. In by family for altered mental status . Pt was getting tramadol recently for sciatica pain.  Hospital Course  #1 acute encephalopathy secondary to hypoglycemia: MRI of the brain did not show stroke. Patient the Lantus was stopped  blood glucose was checked closely. Patient mental status improved. And then you to hold her Lantus secondary to poor by mouth intake. Hypoglycemia improved. #2 hyponatremia due to dehydration agents received hydration but still had hyponatremia. Nephrology followed the patient. A standard workup for SPEP, UPEP.hyponatremia finally improved with tolvaptan.intitially received 15mg  , 2 days and then change it to 30 mg due to no improvement of sodium. Patient's sodium improved with the 30 mg of Tolvaptan. One  Dose today morning. Up to 1:30. Nephrology Dr. Glean Salen was contacted, he recommended patient can be discharged  If sodium is around 130 , gets discharged with salt tablets, Lasix.  3/history of mantle cell lymphoma. Patient has increasing lymphadenopathy and left s supraclavicular lymph node for several  months Seen by cancer doctor Dr. Grayland Ormond he recommended PET scan as an outpatient in [place]. We will make the appointment. 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  4. HTN: controlled continue amlodipine, lisinopril.   Discharge Condition: *stable   Follow UP  Follow-up Information    Follow up with LATEEF, MUNSOOR, MD In 1 week.   Specialty:  Internal Medicine   Contact information:   [place] [id number] [phone]         Discharge Instructions  and  Discharge Medication     Medication List    STOP taking these medications        LANTUS SOLOSTAR 100 UNIT/ML Solostar Pen  Generic drug:  Insulin Glargine  TAKE these medications        acetaminophen 500 MG tablet  Commonly known as:  TYLENOL  Take 500 mg by mouth every 8 (eight) hours as needed.     amLODipine 5 MG tablet  Commonly known as:  NORVASC  Take 5 mg by mouth daily.     aspirin EC 81 MG tablet  Take 81 mg by mouth daily.     atorvastatin 80 MG tablet  Commonly known as:  LIPITOR  Take 80 mg by mouth daily.      chlorthalidone 25 MG tablet  Commonly known as:  HYGROTON  Take 25 mg by mouth daily.     citalopram 20 MG tablet  Commonly known as:  CELEXA  Take 20 mg by mouth daily.     COMBIGAN 0.2-0.5 % ophthalmic solution  Generic drug:  brimonidine-timolol  Place 1 drop into both eyes every 12 (twelve) hours.     DESITIN 13 % Crea  Generic drug:  Zinc Oxide  Apply topically.     docusate sodium 100 MG capsule  Commonly known as:  COLACE  Take 100 mg by mouth at bedtime as needed for mild constipation.     furosemide 40 MG tablet  Commonly known as:  LASIX  Take 1 tablet (40 mg total) by mouth daily.     insulin aspart 100 UNIT/ML injection  Commonly known as:  novoLOG  Inject 0-9 Units into the skin 3 (three) times daily with meals.     lisinopril 40 MG tablet  Commonly known as:  PRINIVIL,ZESTRIL  Take 40 mg by mouth daily.     omeprazole 20 MG capsule  Commonly known as:  PRILOSEC  Take 20 mg by mouth daily.     OPTIVE 0.5-0.9 % Soln  Generic drug:  Carboxymethylcellul-Glycerin  Apply 1-2 drops to eye 3 (three) times daily as needed.     polyethylene glycol packet  Commonly known as:  MIRALAX / GLYCOLAX  Take 17 g by mouth daily as needed.     sodium chloride 1 G tablet  Take 1 tablet (1 g total) by mouth 2 (two) times daily with a meal.     Travoprost (BAK Free) 0.004 % Soln ophthalmic solution  Commonly known as:  TRAVATAN  Place 1 drop into both eyes.     trolamine salicylate 10 % cream  Commonly known as:  ASPERCREME  Apply 1 application topically as needed for muscle pain (left hip).     Vitamin D (Ergocalciferol) 50000 UNITS Caps capsule  Commonly known as:  DRISDOL  Take 50,000 Units by mouth every 30 (thirty) days.          Diet and Activity recommendation: See Discharge Instructions above   Consults obtained - nephorology oncology   Major procedures and Radiology Reports - PLEASE review detailed and final reports for all details, in brief -       Ct Abdomen Pelvis Wo Contrast  10/31/2014  CLINICAL DATA:  Found down, left upper quadrant abdominal pain, history of non-Hodgkin's lymphoma EXAM: CT CHEST, ABDOMEN AND PELVIS WITHOUT CONTRAST TECHNIQUE: Multidetector CT imaging of the chest, abdomen and pelvis was performed following the standard protocol without IV contrast. COMPARISON:  02/23/2012 FINDINGS: CT CHEST FINDINGS Mediastinum/Nodes: The heart is top-normal in size. No pericardial effusion. Coronary atherosclerosis. Atherosclerotic calcifications of the aortic arch. Dominant 17 mm short axis left supraclavicular node, new, suspicious for lymphomatous recurrence given the patient's clinical history. Additional prominent thoracic lymph nodes, new/progressed from 2014,  including: --12 mm short axis left supraclavicular node (series 2/ image 12) --6 mm short axis prevascular node (series 2/ image 20) --8 mm short axis AP window node (series 2/ image 22) --8 mm short axis subcarinal node (series 2/ image 26) Visualized thyroid is unremarkable. Lungs/Pleura: Evaluation of the lung parenchyma is constrained by respiratory motion. Patchy/nodular ground-glass opacities in the posterior right upper lobe and right lower lobe, suspicious for pneumonia, possibly on the basis of aspiration. Minimal dependent atelectasis at the lung bases, likely atelectasis. Underlying mild centrilobular and paraseptal emphysematous changes. No pleural effusion or pneumothorax. Musculoskeletal: Degenerative changes of the thoracic spine. CT ABDOMEN PELVIS FINDINGS Motion degraded images. Hepatobiliary: Unenhanced liver is unremarkable. Layering small gallstones with in the gallbladder neck (series 2/image 36), without associated inflammatory changes. No intrahepatic or extrahepatic ductal dilatation. Pancreas: Within normal limits. Spleen: Normal in size. Adrenals/Urinary Tract: Adrenal glands are within normal limits. Kidneys are unremarkable. No renal, ureteral, or  bladder calculi.  No hydronephrosis. Bladder is within normal limits. Stomach/Bowel: Stomach is within normal limits. No evidence of bowel obstruction. Normal appendix (series 2/image 95). Vascular/Lymphatic: No evidence of abdominal aortic aneurysm. Mildly prominent upper abdominal/retroperitoneal lymph nodes, new/progressed from 2014, including: --9 mm short axis gastrohepatic node (series 2/ image 51) --10 mm short axis node in the porta hepatis (series 2/image 23) --9 mm short axis portacaval node (series 2/ image 58) --9 mm short axis left para-aortic node (series 2/image 69) --8 mm short axis aortocaval node (series 2/image 69) Reproductive: Status post hysterectomy. No adnexal masses. Other: No abdominopelvic ascites. Musculoskeletal: Mild degenerative changes of the lumbar spine. IMPRESSION: Patchy/nodular ground-glass opacities in the posterior right upper lobe and right lower lobe, suspicious for pneumonia, possibly on the basis of aspiration. No evidence of bowel obstruction. Normal appendix. Cholelithiasis, without associated inflammatory changes. No CT findings to account for the patient's left upper quadrant abdominal pain. Dominant 17 mm short axis left supraclavicular node, new, suspicious for lymphomatous recurrence given the patient's history. Additional mildly prominent lymph nodes in the chest, abdomen, and pelvis, as above. Electronically Signed   By: Julian Hy M.D.   On: 10/31/2014 23:03   Dg Chest 1 View  11/15/2014  CLINICAL DATA:  Cough. History of diabetes and non-Hodgkin's lymphoma as well as hypertension. Former smoker. EXAM: CHEST 1 VIEW COMPARISON:  10/11/2013 FINDINGS: Cardiac silhouette normal in size and configuration. No mediastinal or hilar masses or evidence of adenopathy. Clear lungs.  No pleural effusion or pneumothorax. Bony thorax is demineralized but grossly intact. IMPRESSION: No acute cardiopulmonary disease. Electronically Signed   By: Lajean Manes M.D.   On:  11/15/2014 11:35   Ct Head Wo Contrast  10/31/2014  CLINICAL DATA:  Altered mental status.  Apparent fall EXAM: CT HEAD WITHOUT CONTRAST CT CERVICAL SPINE WITHOUT CONTRAST TECHNIQUE: Multidetector CT imaging of the head and cervical spine was performed following the standard protocol without intravenous contrast. Multiplanar CT image reconstructions of the cervical spine were also generated. COMPARISON:  Head CT October 29, 2014 FINDINGS: CT HEAD FINDINGS Moderate diffuse atrophy is stable. There is no intracranial mass, hemorrhage, extra-axial fluid collection, or midline shift. Patchy small vessel disease in the centra semiovale bilaterally is stable. There is no new gray-white compartment lesion. No acute infarct. Bony calvarium appears intact. The mastoid air cells are clear. CT CERVICAL SPINE FINDINGS There is no fracture or appreciable spondylolisthesis. Prevertebral soft tissues and predental space regions are normal. There is moderate disc space narrowing at C5-6, C6-7,  and C7-T1. There is facet hypertrophy at multiple levels bilaterally, most pronounced at C5-6 on the left. No frank disc extrusion or high-grade stenosis. IMPRESSION: CT head: Atrophy with mild periventricular small vessel disease. No intracranial mass, hemorrhage, or acute appearing infarct. CT cervical spine: Multilevel osteoarthritic change. No demonstrable fracture or spondylolisthesis. Electronically Signed   By: Lowella Grip III M.D.   On: 10/31/2014 22:50   Ct Head Wo Contrast  10/29/2014  CLINICAL DATA:  Altered mental status and left-sided weakness. History of non-Hodgkin's lymphoma EXAM: CT HEAD WITHOUT CONTRAST TECHNIQUE: Contiguous axial images were obtained from the base of the skull through the vertex without intravenous contrast. COMPARISON:  None. FINDINGS: There is moderate diffuse atrophy. There is no intracranial mass, hemorrhage, extra-axial fluid collection, or midline shift. Patchy small vessel disease in  the centra semiovale bilaterally. Elsewhere gray-white compartments appear normal. No acute infarct is evident. There is a 4 mm calcification at the level of the lateral aspect of the right anterior cerebral artery seen on axial slice 10 series 2, concerning for a small aneurysm. The attenuation of the middle cerebral arteries is symmetric bilaterally. The bony calvarium appears intact. The mastoid air cells are clear. IMPRESSION: Atrophy with patchy periventricular small vessel disease. No mass, hemorrhage, or acute appearing infarct. Question 4 mm aneurysm arising from lateral aspect of the right anterior cerebral artery, calcified, seen on axial slice 10 series 2. Critical Value/emergent results were called by telephone at the time of interpretation on 10/29/2014 at 1:45 pm to Dr. Charlotte Crumb , who verbally acknowledged these results. Electronically Signed   By: Lowella Grip III M.D.   On: 10/29/2014 13:46   Ct Chest Wo Contrast  10/31/2014  CLINICAL DATA:  Found down, left upper quadrant abdominal pain, history of non-Hodgkin's lymphoma EXAM: CT CHEST, ABDOMEN AND PELVIS WITHOUT CONTRAST TECHNIQUE: Multidetector CT imaging of the chest, abdomen and pelvis was performed following the standard protocol without IV contrast. COMPARISON:  02/23/2012 FINDINGS: CT CHEST FINDINGS Mediastinum/Nodes: The heart is top-normal in size. No pericardial effusion. Coronary atherosclerosis. Atherosclerotic calcifications of the aortic arch. Dominant 17 mm short axis left supraclavicular node, new, suspicious for lymphomatous recurrence given the patient's clinical history. Additional prominent thoracic lymph nodes, new/progressed from 2014, including: --12 mm short axis left supraclavicular node (series 2/ image 12) --6 mm short axis prevascular node (series 2/ image 20) --8 mm short axis AP window node (series 2/ image 22) --8 mm short axis subcarinal node (series 2/ image 26) Visualized thyroid is unremarkable.  Lungs/Pleura: Evaluation of the lung parenchyma is constrained by respiratory motion. Patchy/nodular ground-glass opacities in the posterior right upper lobe and right lower lobe, suspicious for pneumonia, possibly on the basis of aspiration. Minimal dependent atelectasis at the lung bases, likely atelectasis. Underlying mild centrilobular and paraseptal emphysematous changes. No pleural effusion or pneumothorax. Musculoskeletal: Degenerative changes of the thoracic spine. CT ABDOMEN PELVIS FINDINGS Motion degraded images. Hepatobiliary: Unenhanced liver is unremarkable. Layering small gallstones with in the gallbladder neck (series 2/image 36), without associated inflammatory changes. No intrahepatic or extrahepatic ductal dilatation. Pancreas: Within normal limits. Spleen: Normal in size. Adrenals/Urinary Tract: Adrenal glands are within normal limits. Kidneys are unremarkable. No renal, ureteral, or bladder calculi.  No hydronephrosis. Bladder is within normal limits. Stomach/Bowel: Stomach is within normal limits. No evidence of bowel obstruction. Normal appendix (series 2/image 95). Vascular/Lymphatic: No evidence of abdominal aortic aneurysm. Mildly prominent upper abdominal/retroperitoneal lymph nodes, new/progressed from 2014, including: --9 mm short axis gastrohepatic node (  series 2/ image 51) --10 mm short axis node in the porta hepatis (series 2/image 23) --9 mm short axis portacaval node (series 2/ image 58) --9 mm short axis left para-aortic node (series 2/image 69) --8 mm short axis aortocaval node (series 2/image 69) Reproductive: Status post hysterectomy. No adnexal masses. Other: No abdominopelvic ascites. Musculoskeletal: Mild degenerative changes of the lumbar spine. IMPRESSION: Patchy/nodular ground-glass opacities in the posterior right upper lobe and right lower lobe, suspicious for pneumonia, possibly on the basis of aspiration. No evidence of bowel obstruction. Normal appendix.  Cholelithiasis, without associated inflammatory changes. No CT findings to account for the patient's left upper quadrant abdominal pain. Dominant 17 mm short axis left supraclavicular node, new, suspicious for lymphomatous recurrence given the patient's history. Additional mildly prominent lymph nodes in the chest, abdomen, and pelvis, as above. Electronically Signed   By: Julian Hy M.D.   On: 10/31/2014 23:03   Ct Cervical Spine Wo Contrast  10/31/2014  CLINICAL DATA:  Altered mental status.  Apparent fall EXAM: CT HEAD WITHOUT CONTRAST CT CERVICAL SPINE WITHOUT CONTRAST TECHNIQUE: Multidetector CT imaging of the head and cervical spine was performed following the standard protocol without intravenous contrast. Multiplanar CT image reconstructions of the cervical spine were also generated. COMPARISON:  Head CT October 29, 2014 FINDINGS: CT HEAD FINDINGS Moderate diffuse atrophy is stable. There is no intracranial mass, hemorrhage, extra-axial fluid collection, or midline shift. Patchy small vessel disease in the centra semiovale bilaterally is stable. There is no new gray-white compartment lesion. No acute infarct. Bony calvarium appears intact. The mastoid air cells are clear. CT CERVICAL SPINE FINDINGS There is no fracture or appreciable spondylolisthesis. Prevertebral soft tissues and predental space regions are normal. There is moderate disc space narrowing at C5-6, C6-7, and C7-T1. There is facet hypertrophy at multiple levels bilaterally, most pronounced at C5-6 on the left. No frank disc extrusion or high-grade stenosis. IMPRESSION: CT head: Atrophy with mild periventricular small vessel disease. No intracranial mass, hemorrhage, or acute appearing infarct. CT cervical spine: Multilevel osteoarthritic change. No demonstrable fracture or spondylolisthesis. Electronically Signed   By: Lowella Grip III M.D.   On: 10/31/2014 22:50   US Renal  11/16/2014  CLINICAL DATA:  74 year old female  with acute renal failure. EXAM: RENAL / URINARY TRACT ULTRASOUND COMPLETE COMPARISON:  None. FINDINGS: Right Kidney: Length: 9.4 cm. Renal echogenicity is upper limits of normal. Mild cortical thinning noted. There is no evidence of hydronephrosis or solid renal mass. Left Kidney: Length: 9.1 cm. Renal echogenicity is upper limits of normal. Mild cortical thinning noted. There is no evidence of hydronephrosis or solid renal mass. Bladder: Appears normal for degree of bladder distention. IMPRESSION: Upper limits of normal renal echogenicity which may reflect medical renal disease. Mild bilateral renal cortical thinning. No evidence of hydronephrosis. Electronically Signed   By: Margarette Canada M.D.   On: 11/16/2014 15:37   US Carotid Bilateral  11/02/2014  CLINICAL DATA:  Facial droop, hypertension, TIA, diabetes. EXAM: BILATERAL CAROTID DUPLEX ULTRASOUND TECHNIQUE: Pearline Cables scale imaging, color Doppler and duplex ultrasound was performed of bilateral carotid and vertebral arteries in the neck. COMPARISON:  None. REVIEW OF SYSTEMS: Quantification of carotid stenosis is based on velocity parameters that correlate the residual internal carotid diameter with NASCET-based stenosis levels, using the diameter of the distal internal carotid lumen as the denominator for stenosis measurement. The following velocity measurements were obtained: PEAK SYSTOLIC/END DIASTOLIC RIGHT ICA:  48/15cm/sec CCA:                     [id number] SYSTOLIC ICA/CCA RATIO:  0.9 DIASTOLIC ICA/CCA RATIO: 1.4 ECA:                     74cm/sec LEFT ICA:                     41/8cm/sec CCA:                     [id number] SYSTOLIC ICA/CCA RATIO:  0.6 DIASTOLIC ICA/CCA RATIO: 0.8 ECA:                     61cm/sec FINDINGS: RIGHT CAROTID ARTERY: Intimal thickening through the common carotid artery. No significant plaque accumulation or stenosis. Normal waveforms or color Doppler signal. RIGHT VERTEBRAL ARTERY:  Normal flow direction and  waveform. LEFT CAROTID ARTERY: Intimal thickening in the common carotid artery and bulb. No focal plaque accumulation or stenosis. Normal waveforms and color Doppler signal. LEFT VERTEBRAL ARTERY: Normal flow direction and waveform. IMPRESSION: 1. No significant carotid plaque or stenosis. Electronically Signed   By: Lucrezia Europe M.D.   On: 11/02/2014 13:52    Micro Results    Recent Results (from the past 240 hour(s))  MRSA PCR Screening     Status: None   Collection Time: 11/14/14  3:15 AM  Result Value Ref Range Status   MRSA by PCR NEGATIVE NEGATIVE Final    Comment:        The GeneXpert MRSA Assay (FDA approved for NASAL specimens only), is one component of a comprehensive MRSA colonization surveillance program. It is not intended to diagnose MRSA infection nor to guide or monitor treatment for MRSA infections.        Today   Subjective:   [name] today ,no general complaints. D/c to brain center,[place],  Objective:   Blood pressure 113/53, pulse 79, temperature 99 F (37.2 C), temperature source Oral, resp. rate 21, height 5\' 10"  (1.778 m), weight 93.668 kg (206 lb 8 oz), SpO2 99 %.   Intake/Output Summary (Last 24 hours) at 11/20/14 1229 Last data filed at 11/19/14 2134  Gross per 24 hour  Intake      3 ml  Output      0 ml  Net      3 ml    Exam Awake Alert, Oriented x 3, No new F.N deficits, Normal affect [place].AT,PERRAL Supple Neck,No JVD, No cervical lymphadenopathy appriciated.  Symmetrical Chest wall movement, Good air movement bilaterally, CTAB RRR,No Gallops,Rubs or new Murmurs, No Parasternal Heave +ve B.Sounds, Abd Soft, Non tender, No organomegaly appriciated, No rebound -guarding or rigidity. No Cyanosis, Clubbing or edema, No new Rash or bruise  Data Review   CBC w Diff: Lab Results  Component Value Date   WBC 7.1 11/14/2014   WBC 5.2 10/11/2013   HGB 10.7* 11/14/2014   HGB 11.8* 10/11/2013   HCT 31.5* 11/14/2014   HCT 35.5  10/11/2013   PLT 170 11/14/2014   PLT 132* 10/11/2013   LYMPHOPCT 20 11/13/2014   LYMPHOPCT 26.7 07/26/2012   MONOPCT 8 11/13/2014   MONOPCT 8.2 07/26/2012   EOSPCT 1 11/13/2014   EOSPCT 1.1 07/26/2012   BASOPCT 0 11/13/2014   BASOPCT 0.5 07/26/2012    CMP: Lab Results  Component Value Date   NA 130* 11/20/2014   NA 145 10/11/2013  K 3.7 11/20/2014   K 3.9 10/11/2013   CL 97* 11/20/2014   CL 104 10/11/2013   CO2 27 11/20/2014   CO2 33* 10/11/2013   BUN 7 11/20/2014   BUN 21* 10/11/2013   CREATININE 1.15* 11/20/2014   CREATININE 1.52* 10/11/2013   PROT 6.9 10/31/2014   PROT 8.2 03/29/2011   ALBUMIN 3.8 10/31/2014   ALBUMIN 4.1 03/29/2011   BILITOT 1.2 10/31/2014   BILITOT 0.5 03/29/2011   ALKPHOS 67 10/31/2014   ALKPHOS 86 03/29/2011   AST 41 10/31/2014   AST 21 03/29/2011   ALT 18 10/31/2014   ALT 16 03/29/2011  .   Total Time in preparing paper work, data evaluation and todays exam - 94 minutes  Cheila Wickstrom M.D on 11/20/2014 at 12:29 PM

## 2014-11-20 NOTE — Care Management Important Message (Signed)
Important Message  Patient Details  Name: [name] MRN: [id number] Date of Birth: Jul 28, 1940   Medicare Important Message Given:  Yes    Shelbie Ammons, RN 11/20/2014, 7:50 AM

## 2014-11-20 NOTE — Plan of Care (Signed)
Problem: Nutrition: Goal: Adequate nutrition will be maintained Outcome: Progressing Plan of care, progress to goals. 1. Pt resting most of night. 2. Still c/o cough with dose of cough medication given with some relief. 3. Sodium is 129 with improving trend.  BS 129 with improving trend also. 4. [facility]. 5. No falls or injury this shift.  Frequent repositioning secondary to moisture related contact dermatitis to buttock and boot with foam to left heel. Needs a wound consult for heel. Dorna Bloom RN

## 2014-11-20 NOTE — Clinical Social Work Placement (Signed)
   CLINICAL SOCIAL WORK PLACEMENT  NOTE  Date:  11/20/2014  Patient Details  Name: [name] MRN: [id number] Date of Birth: Jun 27, 1940  Clinical Social Work is seeking post-discharge placement for this patient at the [place] level of care (*CSW will initial, date and re-position this form in  chart as items are completed):  Yes ([facility] contracted facilities ([facility] and [facility]))   Patient/family provided with [place] Work Department's list of facilities offering this level of care within the geographic area requested by the patient (or if unable, by the patient's family).  Yes   Patient/family informed of their freedom to choose among providers that offer the needed level of care, that participate in Medicare, Medicaid or managed care program needed by the patient, have an available bed and are willing to accept the patient.  No   Patient/family informed of [facility]'s ownership interest in [facility] and [facility], as well as of the fact that they are under no obligation to receive care at these facilities.  PASRR submitted to EDS on       PASRR number received on       Existing PASRR number confirmed on 11/19/14     FL2 transmitted to all facilities in geographic area requested by pt/family on 11/19/14     FL2 transmitted to all facilities within larger geographic area on       Patient informed that his/her managed care company has contracts with or will negotiate with certain facilities, including the following:        Yes   Patient/family informed of bed offers received.  Patient chooses bed at [facility]     Physician recommends and patient chooses bed at  [facility])    Patient to be transferred to  [facility]) on 11/20/14.  Patient to be transferred to facility by  [facility])     Patient family notified on 11/20/14 of transfer.  Name of family member  notified:   [name] (sister))     PHYSICIAN       Additional Comment:    _______________________________________________ Darden Dates, LCSW 11/20/2014, 3:59 PM

## 2014-11-20 NOTE — Clinical Social Work Note (Signed)
Pt is ready for discharge today to [facility]. Facility is ready to accept pt. CSW spoke with pt to update her on discharge. CSW woke her up from a deep sleep. CSW stated that she would call her family. CSW was unable to reach pt's daughter as she was at work. CSW spoke with pt's sister, Henderson [place], to update her on discharge. RN to call report and EMS will provide transportation. CSW is signing off as no further needs identified.   Darden Dates, MSW, LCSW Clinical Social Worker  [phone]

## 2014-11-20 NOTE — Progress Notes (Signed)
[facility]  ROUNDING NOTE   Subjective:  Na up to 130 today. Pt feeling better overall. Discussed case with Dr. Vianne Bulls. Will transition to lasix and salt tabs as outpt.  Objective:  Vital signs in last 24 hours:  Temp:  [98.3 F (36.8 C)-99 F (37.2 C)] 99 F (37.2 C) (11/17 0907) Pulse Rate:  [74-80] 79 (11/17 0907) Resp:  [20-21] 21 (11/17 0907) BP: (113-136)/(53-66) 113/53 mmHg (11/17 0907) SpO2:  [99 %-100 %] 99 % (11/17 0907) Weight:  [93.668 kg (206 lb 8 oz)] 93.668 kg (206 lb 8 oz) (11/17 0512)  Weight change:  Filed Weights   11/13/14 1606 11/20/14 0512  Weight: 99.791 kg (220 lb) 93.668 kg (206 lb 8 oz)    Intake/Output: I/O last 3 completed shifts: In: 3 [I.V.:3] Out: -    Intake/Output this shift:     Physical Exam: General: NAD, resting in bed  Head: Normocephalic, atraumatic. Moist oral mucosal membranes  Eyes: Anicteric  Neck: Supple, trachea midline  Lungs:  Clear to auscultation normal effort  Heart: Regular rate and rhythm  Abdomen:  Soft, nontender, BS present  Extremities:  trace peripheral edema.  Neurologic: Nonfocal, moving all four extremities  Skin: No lesions       Basic Metabolic Panel:  Recent Labs Lab 11/17/14 0842  11/18/14 0217  11/19/14 0515 11/19/14 1459 11/19/14 2027 11/20/14 0229 11/20/14 0853 11/20/14 1258  NA 124*  < > 125*  < > 126* 127* 127* 129* 130* 130*  K 3.7  --  3.8  --  3.8  --   --   --  3.7 3.7  CL 91*  --  94*  --  94*  --   --   --  97* 96*  CO2 26  --  25  --  25  --   --   --  27 25  GLUCOSE 136*  --  131*  --  156*  --   --   --  138* 157*  BUN 8  --  6  --  8  --   --   --  7 7  CREATININE 0.98  --  1.02*  --  1.07*  --   --   --  1.15* 1.21*  CALCIUM 8.6*  --  8.5*  --  8.6*  --   --   --  8.8* 8.9  < > = values in this interval not displayed.  Liver Function Tests: No results for input(s): AST, ALT, ALKPHOS, BILITOT, PROT, ALBUMIN in the last 168 hours. No results for  input(s): LIPASE, AMYLASE in the last 168 hours. No results for input(s): AMMONIA in the last 168 hours.  CBC:  Recent Labs Lab 11/13/14 1709 11/14/14 0551  WBC 8.2 7.1  NEUTROABS 5.8  --   HGB 11.2* 10.7*  HCT 32.8* 31.5*  MCV 86.2 85.1  PLT 171 170    Cardiac Enzymes:  Recent Labs Lab 11/13/14 1709 11/14/14 0037 11/14/14 0551  TROPONINI 0.04* 0.04* 0.03    BNP: Invalid input(s): POCBNP  CBG:  Recent Labs Lab 11/19/14 1127 11/19/14 1634 11/19/14 2133 11/20/14 0751 11/20/14 1120  GLUCAP 161* 170* 145* 133* 170*    Microbiology: Results for orders placed or performed during the hospital encounter of 11/13/14  MRSA PCR Screening     Status: None   Collection Time: 11/14/14  3:15 AM  Result Value Ref Range Status   MRSA by PCR NEGATIVE NEGATIVE Final  Comment:        The GeneXpert MRSA Assay (FDA approved for NASAL specimens only), is one component of a comprehensive MRSA colonization surveillance program. It is not intended to diagnose MRSA infection nor to guide or monitor treatment for MRSA infections.     Coagulation Studies: No results for input(s): LABPROT, INR in the last 72 hours.  Urinalysis: No results for input(s): COLORURINE, LABSPEC, PHURINE, GLUCOSEU, HGBUR, BILIRUBINUR, KETONESUR, PROTEINUR, UROBILINOGEN, NITRITE, LEUKOCYTESUR in the last 72 hours.  Invalid input(s): APPERANCEUR    Imaging: No results found.   Medications:     . aspirin EC  81 mg Oral Daily  . atorvastatin  80 mg Oral QHS  . brimonidine  1 drop Both Eyes BID  . citalopram  20 mg Oral Daily  . enoxaparin (LOVENOX) injection  40 mg Subcutaneous Q24H  . feeding supplement (GLUCERNA SHAKE)  237 mL Oral BID BM  . insulin aspart  0-9 Units Subcutaneous TID WC  . latanoprost  1 drop Both Eyes QHS  . pantoprazole  40 mg Oral QAC breakfast  . sodium chloride  3 mL Intravenous Q12H  . timolol  1 drop Both Eyes BID  . tolvaptan  30 mg Oral Q24H  . traZODone   25 mg Oral QHS   acetaminophen **OR** acetaminophen, guaiFENesin-dextromethorphan, ondansetron **OR** ondansetron (ZOFRAN) IV  Assessment/ Plan:  74 y.o. female with a PMHx of diabetes mellitus type 2, non-Hodgkin's lymphoma status post stem cell transplant 2009, hypertension, history of coronary artery disease with myocardial infarction, TIA, spinal stenosis, hyperlipidemia, and GERD, who was admitted to [facility] on 11/13/2014 for evaluation of altered mental status secondary to hypoglycemia.   1. Acute renal failure, improved. 2. CKD stage III baseline Cr 1.1 egfr 54. 3. Hyponatremia. 4. Anemia of CKD.  5.  Left supraclavicular lymph node in setting of prior hx of non Hodgkins Lymphoma.   Plan: Renal close to baseline at present. Na upt o 130 today. Ok to discontinue tolvaptan upon discharge. Would transition to lasix 68m po daily along with NaCl 1 gram PO TID.   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   Thanks for allowing [place] participate, will sign off.     LOS: 3 Marjan Rosman 11/17/20161:44 PM

## 2014-11-21 LAB — COMP PANEL: LEUKEMIA/LYMPHOMA: Immunophenotypic Profile: 28

## 2014-11-25 DIAGNOSIS — R112 Nausea with vomiting, unspecified: Secondary | ICD-10-CM | POA: Insufficient documentation

## 2014-12-01 ENCOUNTER — Ambulatory Visit: Payer: Medicare (Managed Care) | Admitting: Pain Medicine

## 2014-12-03 DIAGNOSIS — R634 Abnormal weight loss: Secondary | ICD-10-CM | POA: Insufficient documentation

## 2014-12-10 DIAGNOSIS — R194 Change in bowel habit: Secondary | ICD-10-CM | POA: Insufficient documentation

## 2014-12-11 ENCOUNTER — Inpatient Hospital Stay: Payer: Medicare (Managed Care) | Attending: Oncology | Admitting: Oncology

## 2014-12-11 VITALS — BP 100/61 | HR 76 | Temp 97.8°F | Resp 18

## 2014-12-11 DIAGNOSIS — E785 Hyperlipidemia, unspecified: Secondary | ICD-10-CM | POA: Diagnosis not present

## 2014-12-11 DIAGNOSIS — Z87891 Personal history of nicotine dependence: Secondary | ICD-10-CM | POA: Insufficient documentation

## 2014-12-11 DIAGNOSIS — C8318 Mantle cell lymphoma, lymph nodes of multiple sites: Secondary | ICD-10-CM

## 2014-12-11 DIAGNOSIS — I10 Essential (primary) hypertension: Secondary | ICD-10-CM | POA: Insufficient documentation

## 2014-12-11 DIAGNOSIS — Z8673 Personal history of transient ischemic attack (TIA), and cerebral infarction without residual deficits: Secondary | ICD-10-CM | POA: Diagnosis not present

## 2014-12-11 DIAGNOSIS — I252 Old myocardial infarction: Secondary | ICD-10-CM | POA: Insufficient documentation

## 2014-12-11 DIAGNOSIS — E119 Type 2 diabetes mellitus without complications: Secondary | ICD-10-CM | POA: Insufficient documentation

## 2014-12-11 DIAGNOSIS — Z7982 Long term (current) use of aspirin: Secondary | ICD-10-CM | POA: Diagnosis not present

## 2014-12-11 DIAGNOSIS — D649 Anemia, unspecified: Secondary | ICD-10-CM | POA: Diagnosis not present

## 2014-12-11 DIAGNOSIS — Z794 Long term (current) use of insulin: Secondary | ICD-10-CM | POA: Diagnosis not present

## 2014-12-11 DIAGNOSIS — Z79899 Other long term (current) drug therapy: Secondary | ICD-10-CM | POA: Diagnosis not present

## 2014-12-11 DIAGNOSIS — R599 Enlarged lymph nodes, unspecified: Secondary | ICD-10-CM | POA: Insufficient documentation

## 2014-12-11 DIAGNOSIS — Z9481 Bone marrow transplant status: Secondary | ICD-10-CM | POA: Insufficient documentation

## 2014-12-11 DIAGNOSIS — K219 Gastro-esophageal reflux disease without esophagitis: Secondary | ICD-10-CM | POA: Insufficient documentation

## 2014-12-11 DIAGNOSIS — Z8572 Personal history of non-Hodgkin lymphomas: Secondary | ICD-10-CM | POA: Insufficient documentation

## 2014-12-11 NOTE — Progress Notes (Signed)
Patient here for f/u after hospitalization.  She has 2 knots in her throat that is painful for the past year.

## 2014-12-17 DIAGNOSIS — IMO0002 Reserved for concepts with insufficient information to code with codable children: Secondary | ICD-10-CM | POA: Insufficient documentation

## 2014-12-22 ENCOUNTER — Ambulatory Visit
Admission: RE | Admit: 2014-12-22 | Discharge: 2014-12-22 | Disposition: A | Discharge status: Home / Self Care | Payer: Medicare (Managed Care) | Source: Ambulatory Visit | Attending: Oncology | Admitting: Oncology

## 2014-12-22 DIAGNOSIS — C8318 Mantle cell lymphoma, lymph nodes of multiple sites: Secondary | ICD-10-CM | POA: Diagnosis present

## 2014-12-22 LAB — GLUCOSE, CAPILLARY
GLUCOSE-CAPILLARY: 249 mg/dL — AB (ref 65–99)
Glucose-Capillary: 265 mg/dL — ABNORMAL HIGH (ref 65–99)

## 2014-12-24 ENCOUNTER — Ambulatory Visit: Admission: RE | Admit: 2014-12-24 | Payer: Medicare (Managed Care) | Source: Ambulatory Visit

## 2014-12-24 ENCOUNTER — Telehealth: Payer: Self-pay | Admitting: *Deleted

## 2014-12-24 ENCOUNTER — Inpatient Hospital Stay: Payer: Medicare (Managed Care) | Admitting: Oncology

## 2014-12-24 NOTE — Telephone Encounter (Signed)
Schedule message sent to reschedule PET scan as well as follow up appointment with Dr. Grayland Ormond.

## 2014-12-24 NOTE — Telephone Encounter (Signed)
Was unable to perform PET Monday due to high glucose adn was rescheduled to come in today, but she did not show up

## 2014-12-31 DIAGNOSIS — L0390 Cellulitis, unspecified: Secondary | ICD-10-CM | POA: Insufficient documentation

## 2015-01-02 NOTE — Progress Notes (Signed)
[facility]  Telephone:(336) [phone] [phone]  ID: [name] OB: [birth year]  MR#: [id number]  BZJ#:[id number]  Patient Care Team: Pcp Not In System as PCP - General  CHIEF COMPLAINT:  Chief Complaint  Patient presents with  . New Evaluation    hospital f/u    INTERVAL HISTORY: Patient was last evaluated in the [facility] in July 2014. She was recently admitted to the hospital with altered mental status and found to have hypoglycemia. 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 Currently, patient feels improved and nearly back to her baseline since discharge. She has no neurologic complaints. She denies any fevers. She has a good appetite and denies weight loss. She denies any night sweats. She has no chest pain or shortness of breath. She denies any nausea, vomiting, constipation, or diarrhea. She has no urinary complaints. Patient otherwise feels well and offers no further specific complaints.  REVIEW OF SYSTEMS:   Review of Systems  Constitutional: Positive for malaise/fatigue. Negative for fever and weight loss.  Respiratory: Negative.   Cardiovascular: Negative.   Gastrointestinal: Negative.   Musculoskeletal: Negative.   Neurological: Positive for weakness.    As per HPI. Otherwise, a complete review of systems is negatve.  PAST MEDICAL HISTORY: Past Medical History  Diagnosis Date  . Diabetes mellitus without complication ([place])   . Arthritis   . Non Hodgkin's lymphoma ([place]) 2009    s/p stem cell transplant  . Hypertension   . MI (myocardial infarction) ([place])     1990s  . TIA (transient ischemic attack)   . Spinal stenosis   . HLD (hyperlipidemia)   . GERD (gastroesophageal reflux disease)     PAST SURGICAL HISTORY: Past Surgical History  Procedure Laterality Date  . Abdominal hysterectomy    . Toe amputation Left   . Eye  surgery Bilateral     FAMILY HISTORY Family History  Problem Relation Age of Onset  . Hypertension Mother   . Heart disease Mother   . Diabetes Father   . Breast cancer Sister        ADVANCED DIRECTIVES:    HEALTH MAINTENANCE: Social History  Substance Use Topics  . Smoking status: Former Research scientist (life sciences)  . Smokeless tobacco: Not on file  . Alcohol Use: No     Colonoscopy:  PAP:  Bone density:  Lipid panel:  Allergies  Allergen Reactions  . Penicillins Hives    Current Outpatient Prescriptions  Medication Sig Dispense Refill  . acetaminophen (TYLENOL) 500 MG tablet Take 500 mg by mouth every 8 (eight) hours as needed.    Marland Kitchen amLODipine (NORVASC) 5 MG tablet Take 5 mg by mouth daily.    Marland Kitchen aspirin EC 81 MG tablet Take 81 mg by mouth daily.    Marland Kitchen atorvastatin (LIPITOR) 80 MG tablet Take 80 mg by mouth daily.    . brimonidine-timolol (COMBIGAN) 0.2-0.5 % ophthalmic solution Place 1 drop into both eyes every 12 (twelve) hours.    . Carboxymethylcellul-Glycerin (OPTIVE) 0.5-0.9 % SOLN Apply 1-2 drops to eye 3 (three) times daily as needed.    . chlorthalidone (HYGROTON) 25 MG tablet Take 25 mg by mouth daily.    . citalopram (CELEXA) 20 MG tablet Take 20 mg by mouth daily.    Marland Kitchen docusate sodium (COLACE) 100 MG capsule Take 100 mg by mouth at bedtime as needed for mild constipation.    . furosemide (  LASIX) 40 MG tablet Take 1 tablet (40 mg total) by mouth daily. 30 tablet 0  . insulin aspart (NOVOLOG) 100 UNIT/ML injection Inject 0-9 Units into the skin 3 (three) times daily with meals. 10 mL 11  . lisinopril (PRINIVIL,ZESTRIL) 40 MG tablet Take 40 mg by mouth daily.    Marland Kitchen omeprazole (PRILOSEC) 20 MG capsule Take 20 mg by mouth daily.    . polyethylene glycol (MIRALAX / GLYCOLAX) packet Take 17 g by mouth daily as needed.    . sodium chloride 1 G tablet Take 1 tablet (1 g total) by mouth 2 (two) times daily with a meal. 20 tablet 0  . Travoprost, BAK Free, (TRAVATAN) 0.004 % SOLN  ophthalmic solution Place 1 drop into both eyes.    Marland Kitchen trolamine salicylate (ASPERCREME) 10 % cream Apply 1 application topically as needed for muscle pain (left hip).    . Vitamin D, Ergocalciferol, (DRISDOL) 50000 UNITS CAPS capsule Take 50,000 Units by mouth every 30 (thirty) days.    . Zinc Oxide (DESITIN) 13 % CREA Apply topically.     No current facility-administered medications for this visit.    OBJECTIVE: Filed Vitals:   12/11/14 1129  BP: 100/61  Pulse: 76  Temp: 97.8 F (36.6 C)  Resp: 18     There is no weight on file to calculate BMI.    ECOG FS:1 - Symptomatic but completely ambulatory  General: Well-developed, well-nourished, no acute distress. Eyes: Pink conjunctiva, anicteric sclera. Lungs: Clear to auscultation bilaterally. Heart: Regular rate and rhythm. No rubs, murmurs, or gallops. Abdomen: Soft, nontender, nondistended. No organomegaly noted, normoactive bowel sounds. Musculoskeletal: No edema, cyanosis, or clubbing. Neuro: Alert, answering all questions appropriately. Cranial nerves grossly intact. Skin: No rashes or petechiae noted. Psych: Normal affect. Lymphatics: No cervical, calvicular, axillary or inguinal LAD.   LAB RESULTS:  Lab Results  Component Value Date   NA 130* 11/20/2014   K 3.7 11/20/2014   CL 96* 11/20/2014   CO2 25 11/20/2014   GLUCOSE 157* 11/20/2014   BUN 7 11/20/2014   CREATININE 1.21* 11/20/2014   CALCIUM 8.9 11/20/2014   PROT 6.9 10/31/2014   ALBUMIN 3.8 10/31/2014   AST 41 10/31/2014   ALT 18 10/31/2014   ALKPHOS 67 10/31/2014   BILITOT 1.2 10/31/2014   GFRNONAA 43* 11/20/2014   GFRAA 50* 11/20/2014    Lab Results  Component Value Date   WBC 7.1 11/14/2014   NEUTROABS 5.8 11/13/2014   HGB 10.7* 11/14/2014   HCT 31.5* 11/14/2014   MCV 85.1 11/14/2014   PLT 170 11/14/2014     STUDIES:    CT Chest/abdomen: Patchy/nodular ground-glass opacities in the posterior right upper lobe and right lower lobe, suspicious  for pneumonia, possibly on the basis of aspiration.  No evidence of bowel obstruction. Normal appendix. Cholelithiasis, without associated inflammatory changes. No CT findings to account for the patient's left upper quadrant abdominal pain.  Dominant 17 mm short axis left supraclavicular node, new, suspicious for lymphomatous recurrence given the patient's history. Additional mildly prominent lymph nodes in the chest, abdomen, and pelvis, as Above.   No results found.  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  PLAN:    1. Mantle cell lymphoma: CT scan results reviewed independently with increasing lymphadenopathy concerning for recurrence. We will get a PET scan in the next 1-2 weeks for further evaluation. Patient also may require biopsy of a lymph node and bone marrow biopsy in  the near future. Return to clinic in approximately one month after her scan to discuss the results and further diagnostic planning if necessary. 2. Anemia: Mild, monitor.  Approximately 30 minutes was spent in discussion of which greater than 50% was consultation.  Patient expressed understanding and was in agreement with this plan. She also understands that She can call clinic at any time with any questions, concerns, or complaints.    Lloyd Huger, MD   01/02/2015 12:50 PM

## 2015-01-07 ENCOUNTER — Ambulatory Visit
Admission: RE | Admit: 2015-01-07 | Discharge: 2015-01-07 | Disposition: A | Discharge status: Home / Self Care | Payer: Medicare (Managed Care) | Source: Ambulatory Visit | Attending: Oncology | Admitting: Oncology

## 2015-01-07 DIAGNOSIS — K8020 Calculus of gallbladder without cholecystitis without obstruction: Secondary | ICD-10-CM | POA: Insufficient documentation

## 2015-01-07 DIAGNOSIS — I2510 Atherosclerotic heart disease of native coronary artery without angina pectoris: Secondary | ICD-10-CM | POA: Insufficient documentation

## 2015-01-07 DIAGNOSIS — R161 Splenomegaly, not elsewhere classified: Secondary | ICD-10-CM | POA: Insufficient documentation

## 2015-01-07 DIAGNOSIS — S91302A Unspecified open wound, left foot, initial encounter: Secondary | ICD-10-CM | POA: Insufficient documentation

## 2015-01-07 DIAGNOSIS — J90 Pleural effusion, not elsewhere classified: Secondary | ICD-10-CM | POA: Diagnosis not present

## 2015-01-07 DIAGNOSIS — Z0189 Encounter for other specified special examinations: Secondary | ICD-10-CM | POA: Insufficient documentation

## 2015-01-07 DIAGNOSIS — C8318 Mantle cell lymphoma, lymph nodes of multiple sites: Secondary | ICD-10-CM | POA: Diagnosis present

## 2015-01-07 DIAGNOSIS — B999 Unspecified infectious disease: Secondary | ICD-10-CM | POA: Diagnosis not present

## 2015-01-07 LAB — GLUCOSE, CAPILLARY: GLUCOSE-CAPILLARY: 161 mg/dL — AB (ref 65–99)

## 2015-01-07 MED ORDER — FLUDEOXYGLUCOSE F - 18 (FDG) INJECTION
11.9500 | Freq: Once | INTRAVENOUS | Status: AC | PRN
  Administered 2015-01-07: 11.95 via INTRAVENOUS

## 2015-01-09 ENCOUNTER — Encounter: Payer: Medicare (Managed Care) | Attending: Surgery | Admitting: Surgery

## 2015-01-09 ENCOUNTER — Ambulatory Visit
Admission: RE | Admit: 2015-01-09 | Discharge: 2015-01-09 | Disposition: A | Discharge status: Home / Self Care | Payer: Medicare (Managed Care) | Source: Ambulatory Visit | Attending: Surgery | Admitting: Surgery

## 2015-01-09 ENCOUNTER — Other Ambulatory Visit: Payer: Self-pay | Admitting: Surgery

## 2015-01-09 DIAGNOSIS — E785 Hyperlipidemia, unspecified: Secondary | ICD-10-CM | POA: Diagnosis not present

## 2015-01-09 DIAGNOSIS — E11622 Type 2 diabetes mellitus with other skin ulcer: Secondary | ICD-10-CM | POA: Diagnosis not present

## 2015-01-09 DIAGNOSIS — Z89422 Acquired absence of other left toe(s): Secondary | ICD-10-CM | POA: Insufficient documentation

## 2015-01-09 DIAGNOSIS — B999 Unspecified infectious disease: Secondary | ICD-10-CM

## 2015-01-09 DIAGNOSIS — Z9889 Other specified postprocedural states: Secondary | ICD-10-CM | POA: Insufficient documentation

## 2015-01-09 DIAGNOSIS — C8310 Mantle cell lymphoma, unspecified site: Secondary | ICD-10-CM | POA: Insufficient documentation

## 2015-01-09 DIAGNOSIS — H3530 Unspecified macular degeneration: Secondary | ICD-10-CM | POA: Insufficient documentation

## 2015-01-09 DIAGNOSIS — E11621 Type 2 diabetes mellitus with foot ulcer: Secondary | ICD-10-CM | POA: Insufficient documentation

## 2015-01-09 DIAGNOSIS — H409 Unspecified glaucoma: Secondary | ICD-10-CM | POA: Diagnosis not present

## 2015-01-09 DIAGNOSIS — C8318 Mantle cell lymphoma, lymph nodes of multiple sites: Secondary | ICD-10-CM | POA: Diagnosis not present

## 2015-01-09 DIAGNOSIS — E11610 Type 2 diabetes mellitus with diabetic neuropathic arthropathy: Secondary | ICD-10-CM | POA: Diagnosis not present

## 2015-01-09 DIAGNOSIS — I10 Essential (primary) hypertension: Secondary | ICD-10-CM | POA: Diagnosis not present

## 2015-01-09 DIAGNOSIS — L89623 Pressure ulcer of left heel, stage 3: Secondary | ICD-10-CM | POA: Diagnosis not present

## 2015-01-09 DIAGNOSIS — M869 Osteomyelitis, unspecified: Secondary | ICD-10-CM

## 2015-01-09 DIAGNOSIS — Z9221 Personal history of antineoplastic chemotherapy: Secondary | ICD-10-CM | POA: Diagnosis not present

## 2015-01-09 DIAGNOSIS — I2510 Atherosclerotic heart disease of native coronary artery without angina pectoris: Secondary | ICD-10-CM | POA: Diagnosis not present

## 2015-01-10 NOTE — Progress Notes (Addendum)
EMMI, WERTHEIM ([id number]) Visit Report for 01/09/2015 Chief Complaint Document Details Patient Name: [name], [name]. Date of Service: 01/09/2015 1:30 PM Medical Record Number: [id number] Patient Account Number: [id number] Date of Birth/Sex: 01/01/1941 (75 y.o. Female) Treating RN: Cornell Barman Primary Care Physician: SYSTEM, PCP Other Clinician: Referring Physician: Durenda Guthrie Treating Physician/Extender: Frann Rider in Treatment: 0 Information Obtained from: Patient Chief Complaint Patients presents for treatment of an open diabetic ulcer and pressure ulcer to her left heel which she has had for 6 months. Electronic Signature(s) Signed: 01/09/2015 3:10:45 PM By: Christin Fudge MD, FACS Previous Signature: 01/09/2015 2:30:11 PM Version By: Christin Fudge MD, FACS Previous Signature: 01/09/2015 2:06:15 PM Version By: Christin Fudge MD, FACS Entered By: Christin Fudge on 01/09/2015 15:10:45 DONETA, BAYMAN ([id number]) -------------------------------------------------------------------------------- Debridement Details Patient Name: Tricia, Lee. Date of Service: 01/09/2015 1:30 PM Medical Record Number: [id number] Patient Account Number: [id number] Date of Birth/Sex: 12-Aug-1940 (75 y.o. Female) Treating RN: Cornell Barman Primary Care Physician: SYSTEM, PCP Other Clinician: Referring Physician: Durenda Guthrie Treating Physician/Extender: Frann Rider in Treatment: 0 Debridement Performed for Wound #1 Left Calcaneus Assessment: Performed By: Physician Christin Fudge, MD Debridement: Debridement Pre-procedure Yes Verification/Time Out Taken: Start Time: 14:35 Pain Control: Other : LIDOCAINE 4% CREAM Level: Skin/Subcutaneous Tissue Total Area Debrided (L x 4.3 (cm) x 4.5 (cm) = 19.35 (cm) W): Tissue and other Viable, Non-Viable, Fibrin/Slough, Subcutaneous material debrided: Instrument: Forceps, Scissors Bleeding: Large Hemostasis Achieved: Silver Nitrate End Time:  14:40 Procedural Pain: 0 Post Procedural Pain: 0 Response to Treatment: Procedure was tolerated well Post Debridement Measurements of Total Wound Length: (cm) 4.4 Width: (cm) 4.6 Depth: (cm) 0.2 Volume: (cm) 3.179 Post Procedure Diagnosis Same as Pre-procedure Electronic Signature(s) Signed: 01/09/2015 3:10:35 PM By: Christin Fudge MD, FACS Signed: 01/09/2015 5:51:05 PM By: Gretta Cool RN, BSN, Kim RN, BSN Entered By: Christin Fudge on 01/09/2015 15:10:35 ROLENA, KNUTSON ([id number]) -------------------------------------------------------------------------------- HPI Details Patient Name: Tricia, Lee. Date of Service: 01/09/2015 1:30 PM Medical Record Number: [id number] Patient Account Number: [id number] Date of Birth/Sex: 1940/12/06 (75 y.o. Female) Treating RN: Cornell Barman Primary Care Physician: SYSTEM, PCP Other Clinician: Referring Physician: Durenda Guthrie Treating Physician/Extender: Frann Rider in Treatment: 0 History of Present Illness Location: ulceration of the left heel and calcaneal region Quality: Patient reports experiencing a dull pain to affected area(s). Severity: Patient states wound are getting worse. Duration: Patient has had the wound for >6 months prior to seeking treatment at the wound center Timing: Pain in wound is Intermittent (comes and goes Context: The wound appeared gradually over time Modifying Factors: Other treatment(s) tried include: Santyl ointment locally Associated Signs and Symptoms: Patient reports having difficulty standing for long periods. HPI Description: 75 year old female who comes to see [place] for a left heel pressure injury which she has had for 6 months. 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 She is status post stem cell transplant. as per her oncologist Dr. Grayland Ormond she has a PET scan to be done and may need a lymph node biopsy and a bone marrow biopsy in the near future. He is also status post abdominal hysterectomy, left toe amputation and eyes surgery. Electronic Signature(s) Signed: 01/09/2015 3:11:44 PM By: Christin Fudge MD, FACS Previous Signature: 01/09/2015 2:30:45  PM Version By: Christin Fudge MD, FACS Previous Signature: 01/09/2015 2:15:03 PM Version By: Christin Fudge MD, FACS Previous Signature: 01/09/2015 2:07:27 PM Version By: Christin Fudge MD, FACS Entered By: Christin Fudge on 01/09/2015 15:11:44 LORELEY, SCHWALL ([id number]) -------------------------------------------------------------------------------- Physical Exam Details Patient Name: Lee, Tricia. Date of Service: 01/09/2015 1:30 PM Medical Record Number: [id number] Patient Account Number: [id number] Date of Birth/Sex: 11/30/1940 (75 y.o. Female) Treating RN: Cornell Barman Primary Care Physician: SYSTEM, PCP Other Clinician: Referring Physician: Durenda Guthrie Treating Physician/Extender: Frann Rider in Treatment: 0 Constitutional . Pulse regular. Respirations normal and unlabored. Afebrile. . Eyes Nonicteric. Reactive to light. Ears, Nose, Mouth, and Throat Lips, teeth, and gums WNL.Marland Kitchen Moist mucosa without lesions. Neck supple and nontender. No palpable supraclavicular or cervical adenopathy. Normal sized without goiter. Respiratory WNL. No retractions.. Cardiovascular Pedal Pulses WNL. ABI was not measurable on the left side. she has minimal pedal edema on the left lower extremity. Gastrointestinal (GI) Abdomen without masses or tenderness.. No liver or spleen enlargement or tenderness.. Lymphatic No adneopathy. No adenopathy. No adenopathy. Musculoskeletal Adexa without tenderness or enlargement.. Digits and nails w/o  clubbing, cyanosis, infection, petechiae, ischemia, or inflammatory conditions.. Integumentary (Hair, Skin) No suspicious lesions. No crepitus or fluctuance. No peri-wound warmth or erythema. No masses.Marland Kitchen Psychiatric Judgement and insight Intact.. No evidence of depression, anxiety, or agitation.. Notes she has a large stage III decubitus ulcer on the left heel with subcutaneous tissue which needed sharp debridement with a forcep and scissors and this has been done. Electronic Signature(s) Signed: 01/09/2015 3:13:04 PM By: Christin Fudge MD, FACS Entered By: Christin Fudge on 01/09/2015 15:13:03 SAMYUKTA, CURA ([id number]) -------------------------------------------------------------------------------- Physician Orders Details Patient Name: BELLAMIE, TURNEY. Date of Service: 01/09/2015 1:30 PM Medical Record Number: [id number] Patient Account Number: [id number] Date of Birth/Sex: 02/27/1940 (75 y.o. Female) Treating RN: Carolyne Fiscal, Debi Primary Care Physician: SYSTEM, PCP Other Clinician: Referring Physician: Durenda Guthrie Treating Physician/Extender: Frann Rider in Treatment: 0 Verbal / Phone Orders: Yes ClinicianCarolyne Fiscal, Debi Read Back and Verified: Yes Diagnosis Coding ICD-10 Coding Code Description E11.621 Type 2 diabetes mellitus with foot ulcer L89.623 Pressure ulcer of left heel, stage 3 Wound Cleansing Wound #1 Left Calcaneus o Clean wound with Normal Saline. Anesthetic Wound #1 Left Calcaneus o Topical Lidocaine 4% cream applied to wound bed prior to debridement Skin Barriers/Peri-Wound Care Wound #1 Left Calcaneus o Barrier cream Primary Wound Dressing Wound #1 Left Calcaneus o Santyl Ointment Secondary Dressing Wound #1 Left Calcaneus o Dry Gauze o Boardered Foam Dressing - heel protector Dressing Change Frequency Wound #1 Left Calcaneus o Change dressing every day. - PLEASE CHANGE DAILY Follow-up Appointments Wound #1 Left  Calcaneus o Return Appointment in 1 week. ZAILEE, VALLELY ([id number]) Off-Loading o Other: - When lying in bed please float the heels Medications-please add to medication list. Wound #1 Left Calcaneus o Other: - Please give pt Vitamin C. Zinc, and Multi Vitamins daily. Radiology o X-ray, heel - left Electronic Signature(s) Signed: 01/09/2015 5:05:49 PM By: Christin Fudge MD, FACS Signed: 01/09/2015 5:37:28 PM By: Alric Quan Entered By: Alric Quan on 01/09/2015 14:59:46 SHATARIA, CRIST ([id number]) -------------------------------------------------------------------------------- Problem List Details Patient Name: KRISHA, BEEGLE. Date of Service: 01/09/2015 1:30 PM Medical Record Number: [id number] Patient Account Number: [id number] Date of Birth/Sex: 06-12-1940 (75 y.o. Female) Treating RN: Cornell Barman Primary Care Physician: SYSTEM, PCP Other Clinician: Referring Physician: Durenda Guthrie Treating Physician/Extender: Frann Rider in Treatment: 0 Active Problems ICD-10 Encounter Code Description Active Date Diagnosis  W23.762 Type 2 diabetes mellitus with foot ulcer 01/09/2015 Yes L89.623 Pressure ulcer of left heel, stage 3 01/09/2015 Yes C83.10 Mantle cell lymphoma, unspecified site 01/09/2015 Yes Z92.21 Personal history of antineoplastic chemotherapy 01/09/2015 Yes Inactive Problems Resolved Problems Electronic Signature(s) Signed: 01/09/2015 3:10:15 PM By: Christin Fudge MD, FACS Previous Signature: 01/09/2015 2:05:36 PM Version By: Christin Fudge MD, FACS Entered By: Christin Fudge on 01/09/2015 15:10:15 BRITTANIE, DOSANJH ([id number]) -------------------------------------------------------------------------------- Progress Note Details Patient Name: [name]. Date of Service: 01/09/2015 1:30 PM Medical Record Number: [id number] Patient Account Number: [id number] Date of Birth/Sex: October 15, 1940 (75 y.o. Female) Treating RN: Cornell Barman Primary Care Physician:  SYSTEM, PCP Other Clinician: Referring Physician: Durenda Guthrie Treating Physician/Extender: Frann Rider in Treatment: 0 Subjective Chief Complaint Information obtained from Patient Patients presents for treatment of an open diabetic ulcer and pressure ulcer to her left heel which she has had for 6 months. History of Present Illness (HPI) The following HPI elements were documented for the patient's wound: Location: ulceration of the left heel and calcaneal region Quality: Patient reports experiencing a dull pain to affected area(s). Severity: Patient states wound are getting worse. Duration: Patient has had the wound for >6 months prior to seeking treatment at the wound center Timing: Pain in wound is Intermittent (comes and goes Context: The wound appeared gradually over time Modifying Factors: Other treatment(s) tried include: Santyl ointment locally Associated Signs and Symptoms: Patient reports having difficulty standing for long periods. 75 year old female who comes to see [place] for a left heel pressure injury which she has had for 6 months. 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 She is status post stem cell transplant. as per her oncologist Dr. Grayland Ormond she has a PET scan to be done and may need a lymph node biopsy and a bone marrow biopsy in the near future. He is also status post abdominal hysterectomy, left toe amputation and eyes surgery. Wound History Patient presents with 1 open wound that has been present for approximately 6 months. Patient has been treating wound in the following manner: santyl. Laboratory tests have not been performed in the last  month. Patient reportedly has tested positive for an antibiotic resistant organism. Patient reportedly has not tested positive for osteomyelitis. Patient reportedly has not had testing performed to evaluate circulation in the legs. Patient experiences the following problems associated with their wounds: swelling. Patient History Information obtained from Patient. BURDETTE, GERGELY ([id number]) Allergies penicillin (Reaction: unknown) Family History Cancer - Siblings, Diabetes - Mother, Child, Heart Disease - Child, Hypertension - Siblings, Father, Mother, Paternal Grandparents, Maternal Grandparents, Child, Lung Disease - Siblings, Thyroid Problems - Child, No family history of Hereditary Spherocytosis, Kidney Disease, Seizures, Stroke, Tuberculosis. Social History Never smoker - used snuff in the past, Marital Status - Widowed, Alcohol Use - Never, Drug Use - No History, Caffeine Use - Daily. Medical History Eyes Patient has history of Cataracts, Glaucoma Respiratory Patient has history of Sleep Apnea Cardiovascular Patient has history of Hypertension, Myocardial Infarction Endocrine Patient has history of Type II Diabetes Integumentary (Skin) Patient has history of History of pressure wounds Musculoskeletal Patient has history of Rheumatoid Arthritis Denies history of Osteoarthritis Neurologic Patient has history of Neuropathy Oncologic Patient has history of Received Chemotherapy - 2008,  Received Radiation - 2008 Patient is treated with Insulin. Blood sugar is tested. Medical And Surgical History Notes Respiratory bronchitis Cardiovascular heart murmur, stroke Gastrointestinal GERD Oncologic recently dx with cancer has to go to the follow up 2008 lymphoma Review of Systems (ROS) Constitutional Symptoms (General Health) The patient has no complaints or symptoms. Eyes Complains or has symptoms of Glasses / Contacts - glasses. Ear/Nose/Mouth/Throat The patient has no  complaints or symptoms. IREAN, KENDRICKS ([id number]) Hematologic/Lymphatic The patient has no complaints or symptoms. Respiratory Complains or has symptoms of Chronic or frequent coughs, Shortness of Breath. Genitourinary The patient has no complaints or symptoms. Immunological The patient has no complaints or symptoms. Integumentary (Skin) Complains or has symptoms of Wounds. Musculoskeletal Complains or has symptoms of Muscle Pain, Muscle Weakness. Medications Prostat oral liquid oral aspirin 81 mg tablet,delayed release oral 1 1 tablet,delayed release (DR/EC) oral Tylenol Extra Strength 500 mg tablet oral 2 2 tablet oral promethazine 12.5 mg tablet oral 1 1 tablet oral Lipitor 80 mg tablet oral 1 1 tablet oral lisinopril 40 mg tablet oral 1 1 tablet oral Refresh Optive 0.5 %-0.9 % eye drops ophthalmic drops ophthalmic Colace 100 mg capsule oral capsule oral Miralax 17 gram oral powder packet oral powder in packet oral furosemide 40 mg tablet oral 1 1 tablet oral Combigan 0.2 %-0.5 % eye drops ophthalmic drops ophthalmic Travatan Z 0.004 % eye drops ophthalmic drops ophthalmic multivitamin with minerals tablet oral 1 1 tablet oral dextromethorphan-guaifenesin 10 mg-100 mg/5 mL oral liquid oral liquid oral Glucerna oral liquid oral liquid oral Desitin 40 % topical paste topical paste topical Unjury 21 gram-100 kcal/27 gram oral powder packet oral powder in packet oral sodium chloride 1 gram tablet oral 1 1 tablet oral Santyl 250 unit/gram topical ointment topical ointment topical Drisdol 50,000 unit capsule oral 1 1 capsule oral Objective Constitutional Pulse regular. Respirations normal and unlabored. Afebrile. Vitals Time Taken: 1:51 PM, Height: 65 in, Source: Stated, Weight: 185 lbs, Source: Stated, BMI: 30.8, Temperature: 97.6 F, Pulse: 88 bpm, Respiratory Rate: 18 breaths/min, Blood Pressure: 179/98 mmHg. ANNTIONETTE, MADKINS ([id number]) Eyes Nonicteric. Reactive to  light. Ears, Nose, Mouth, and Throat Lips, teeth, and gums WNL.Marland Kitchen Moist mucosa without lesions. Neck supple and nontender. No palpable supraclavicular or cervical adenopathy. Normal sized without goiter. Respiratory WNL. No retractions.. Cardiovascular Pedal Pulses WNL. ABI was not measurable on the left side. she has minimal pedal edema on the left lower extremity. Gastrointestinal (GI) Abdomen without masses or tenderness.. No liver or spleen enlargement or tenderness.. Lymphatic No adneopathy. No adenopathy. No adenopathy. Musculoskeletal Adexa without tenderness or enlargement.. Digits and nails w/o clubbing, cyanosis, infection, petechiae, ischemia, or inflammatory conditions.Marland Kitchen Psychiatric Judgement and insight Intact.. No evidence of depression, anxiety, or agitation.. General Notes: she has a large stage III decubitus ulcer on the left heel with subcutaneous tissue which needed sharp debridement with a forcep and scissors and this has been done. Integumentary (Hair, Skin) No suspicious lesions. No crepitus or fluctuance. No peri-wound warmth or erythema. No masses.. Wound #1 status is Open. Original cause of wound was Gradually Appeared. The wound is located on the Left Calcaneus. The wound measures 4.3cm length x 4.5cm width x 0.2cm depth; 15.197cm^2 area and 3.039cm^3 volume. The wound is limited to skin breakdown. There is no tunneling or undermining noted. There is a large amount of purulent drainage noted. The wound margin is thickened. There is no granulation within the wound bed. There is a large (67-100%) amount of necrotic  tissue within the wound bed including Adherent Slough. The periwound skin appearance exhibited: Localized Edema. The periwound skin appearance did not exhibit: Maceration, Moist. Periwound temperature was noted as No Abnormality. The periwound has tenderness on palpation. Assessment MARYHELEN, LINDLER ([id number]) Active Problems ICD-10 E11.621 -  Type 2 diabetes mellitus with foot ulcer L89.623 - Pressure ulcer of left heel, stage 3 C83.10 - Mantle cell lymphoma, unspecified site Z92.21 - Personal history of antineoplastic chemotherapy This 75 year old patient who has a diabetic foot ulcer due to pressure injury has a stage III pressure injury to the left heel. Her vascularity has not been measured due to technical difficulties today and if an ABI cannot be documented on the next visit we may need to do an arterial duplex study. today I have recommended: 1. Santyl ointment locally to be changed daily and use some zinc oxide on the macerated skin around this. 2. X-ray of the left foot 3 views 3. Appropriate offloading and this has been discussed in great detail. Lia Hopping boat is being used at nighttime 4. Nutrition with additional protein and also discuss vitamin supplements including vitamin C and zinc 5. Regular was it's to the wound care center. The daughters at the bedside and all her questions have been answered Procedures Wound #1 Wound #1 is an Arterial Insufficiency Ulcer located on the Left Calcaneus . There was a Skin/Subcutaneous Tissue Debridement ([id number]) debridement with total area of 19.35 sq cm performed by Christin Fudge, MD. with the following instrument(s): Forceps and Scissors to remove Viable and Non-Viable tissue/material including Fibrin/Slough and Subcutaneous after achieving pain control using Other (LIDOCAINE 4% CREAM). A time out was conducted prior to the start of the procedure. A Large amount of bleeding was controlled with Silver Nitrate. The procedure was tolerated well with a pain level of 0 throughout and a pain level of 0 following the procedure. Post Debridement Measurements: 4.4cm length x 4.6cm width x 0.2cm depth; 3.179cm^3 volume. Post procedure Diagnosis Wound #1: Same as Pre-Procedure Plan MARCHA, LICKLIDER ([id number]) Wound Cleansing: Wound #1 Left Calcaneus: Clean wound with Normal  Saline. Anesthetic: Wound #1 Left Calcaneus: Topical Lidocaine 4% cream applied to wound bed prior to debridement Skin Barriers/Peri-Wound Care: Wound #1 Left Calcaneus: Barrier cream Primary Wound Dressing: Wound #1 Left Calcaneus: Santyl Ointment Secondary Dressing: Wound #1 Left Calcaneus: Dry Gauze Boardered Foam Dressing - heel protector Dressing Change Frequency: Wound #1 Left Calcaneus: Change dressing every day. - PLEASE CHANGE DAILY Follow-up Appointments: Wound #1 Left Calcaneus: Return Appointment in 1 week. Off-Loading: Other: - When lying in bed please float the heels Medications-please add to medication list.: Wound #1 Left Calcaneus: Other: - Please give pt Vitamin C. Zinc, and Multi Vitamins daily. Radiology ordered were: X-ray, heel - left This 75 year old patient who has a diabetic foot ulcer due to pressure injury has a stage III pressure injury to the left heel. Her vascularity has not been measured due to technical difficulties today and if an ABI cannot be documented on the next visit we may need to do an arterial duplex study. today I have recommended: 1. Santyl ointment locally to be changed daily and use some zinc oxide on the macerated skin around this. 2. X-ray of the left foot 3 views 3. Appropriate offloading and this has been discussed in great detail. Lia Hopping boat is being used at nighttime 4. Nutrition with additional protein and also discuss vitamin supplements including vitamin C and zinc 5. Regular was it's to the wound care center.  JERICCA, RUSSETT ([id number]) The daughters at the bedside and all her questions have been answered Electronic Signature(s) Signed: 01/09/2015 3:16:14 PM By: Christin Fudge MD, FACS Entered By: Christin Fudge on 01/09/2015 15:16:14 CARIN, SHIPP ([id number]) -------------------------------------------------------------------------------- ROS/PFSH Details Patient Name: [name] Date of Service: 01/09/2015  1:30 PM Medical Record Number: [id number] Patient Account Number: [id number] Date of Birth/Sex: 1940/06/18 (75 y.o. Female) Treating RN: Carolyne Fiscal, Debi Primary Care Physician: SYSTEM, PCP Other Clinician: Referring Physician: Durenda Guthrie Treating Physician/Extender: Frann Rider in Treatment: 0 Information Obtained From Patient Wound History Do you currently have one or more open woundso Yes How many open wounds do you currently haveo 1 Approximately how long have you had your woundso 6 months How have you been treating your wound(s) until nowo santyl Has your wound(s) ever healed and then re-openedo No Have you had any lab work done in the past montho No Have you tested positive for an antibiotic resistant organism (MRSA, VRE)o Yes Date: 10/22/2014 Have you tested positive for osteomyelitis (bone infection)o No Have you had any tests for circulation on your legso No Have you had other problems associated with your woundso Swelling Eyes Complaints and Symptoms: Positive for: Glasses / Contacts - glasses Medical History: Positive for: Cataracts; Glaucoma Respiratory Complaints and Symptoms: Positive for: Chronic or frequent coughs; Shortness of Breath Medical History: Positive for: Sleep Apnea Past Medical History Notes: bronchitis Integumentary (Skin) Complaints and Symptoms: Positive for: Wounds Medical History: Positive for: History of pressure wounds ADAM, DEMARY ([id number]) Musculoskeletal Complaints and Symptoms: Positive for: Muscle Pain; Muscle Weakness Medical History: Positive for: Rheumatoid Arthritis Negative for: Osteoarthritis Constitutional Symptoms (General Health) Complaints and Symptoms: No Complaints or Symptoms Ear/Nose/Mouth/Throat Complaints and Symptoms: No Complaints or Symptoms Hematologic/Lymphatic Complaints and Symptoms: No Complaints or Symptoms Cardiovascular Medical History: Positive for: Hypertension; Myocardial  Infarction Past Medical History Notes: heart murmur, stroke Gastrointestinal Medical History: Past Medical History Notes: GERD Endocrine Medical History: Positive for: Type II Diabetes Time with diabetes: 64 years Treated with: Insulin Blood sugar tested every day: Yes Tested : 3 times a day Genitourinary Complaints and Symptoms: No Complaints or Symptoms Immunological EMMALEAH, MERONEY ([id number]) Complaints and Symptoms: No Complaints or Symptoms Neurologic Medical History: Positive for: Neuropathy Oncologic Medical History: Positive for: Received Chemotherapy - 2008; Received Radiation - 2008 Past Medical History Notes: recently dx with cancer has to go to the follow up 2008 lymphoma HBO Extended History Items Eyes: Eyes: Cataracts Glaucoma Family and Social History Cancer: Yes - Siblings; Diabetes: Yes - Mother, Child; Heart Disease: Yes - Child; Hereditary Spherocytosis: No; Hypertension: Yes - Siblings, Father, Mother, Paternal Grandparents, Maternal Grandparents, Child; Kidney Disease: No; Lung Disease: Yes - Siblings; Seizures: No; Stroke: No; Thyroid Problems: Yes - Child; Tuberculosis: No; Never smoker - used snuff in the past; Marital Status - Widowed; Alcohol Use: Never; Drug Use: No History; Caffeine Use: Daily; Financial Concerns: No; Food, Clothing or Shelter Needs: No; Support System Lacking: No; Transportation Concerns: No; Advanced Directives: No; Patient does not want information on Advanced Directives; Do not resuscitate: No; Living Will: No; Medical Power of Attorney: No Physician Affirmation I have reviewed and agree with the above information. Electronic Signature(s) Signed: 01/09/2015 2:29:39 PM By: Christin Fudge MD, FACS Signed: 01/09/2015 5:37:28 PM By: Alric Quan Entered By: Christin Fudge on 01/09/2015 14:29:39 DEVAEH, AMADI ([id number]) -------------------------------------------------------------------------------- SuperBill  Details Patient Name: KYLA, DUFFY. Date of Service: 01/09/2015 Medical Record Number: [id number] Patient Account Number: [id number] Date of Birth/Sex:  January 04, 1940 (75 y.o. 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 Facility Procedures CPT4 Code: 43568616 Description: 83729 - DEB SUBQ TISSUE 20 SQ CM/< ICD-10 Description Diagnosis E11.621 Type 2 diabetes mellitus with foot ulcer L89.623 Pressure ulcer of left heel, stage 3 Modifier: Quantity: 1 Physician Procedures CPT4 Code: 0211155 Description: 20802 - WC PHYS LEVEL 4 - NEW PT ICD-10 Description Diagnosis E11.621 Type 2 diabetes mellitus with foot ulcer L89.623 Pressure ulcer of left heel, stage 3 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

## 2015-01-10 NOTE — Progress Notes (Signed)
KANISHA, KRANING ([id number]) Visit Report for 01/09/2015 Allergy List Details Patient Name: [name], [name]. Date of Service: 01/09/2015 1:30 PM Medical Record Number: [id number] Patient Account Number: [id number] Date of Birth/Sex: 1940-09-21 (75 y.o. Female) Treating RN: Carolyne Fiscal, Debi Primary Care Physician: SYSTEM, PCP Other Clinician: Referring Physician: Durenda Guthrie Treating Physician/Extender: Frann Rider in Treatment: 0 Allergies Active Allergies penicillin Reaction: unknown Allergy Notes Electronic Signature(s) Signed: 01/09/2015 5:37:28 PM By: Alric Quan Entered By: Alric Quan on 01/09/2015 13:55:35 [name], [name] ([id number]) -------------------------------------------------------------------------------- Arrival Information Details Patient Name: [name] Date of Service: 01/09/2015 1:30 PM Medical Record Number: [id number] Patient Account Number: [id number] Date of Birth/Sex: 02/26/1940 (75 y.o. Female) Treating RN: Carolyne Fiscal, Debi Primary Care Physician: SYSTEM, PCP Other Clinician: Referring Physician: Durenda Guthrie Treating Physician/Extender: Frann Rider in Treatment: 0 Visit Information Patient Arrived: Wheel Chair Arrival Time: 13:48 Accompanied By: daughter Transfer Assistance: Other Patient Identification Verified: Yes Secondary Verification Process Yes Completed: Patient Requires Transmission-Based No Precautions: Patient Has Alerts: Yes Patient Alerts: DM II Electronic Signature(s) Signed: 01/09/2015 5:37:28 PM By: Alric Quan Entered By: Alric Quan on 01/09/2015 13:54:39 [name], [name] ([id number]) -------------------------------------------------------------------------------- Encounter Discharge Information Details Patient Name: [name]. Date of Service: 01/09/2015 1:30 PM Medical Record Number: [id number] Patient Account Number: [id number] Date of Birth/Sex: September 07, 1940 (75 y.o.  Female) Treating RN: Carolyne Fiscal, Debi Primary Care Physician: SYSTEM, PCP Other Clinician: Referring Physician: Durenda Guthrie Treating Physician/Extender: Frann Rider in Treatment: 0 Encounter Discharge Information Items Discharge Pain Level: 0 Discharge Condition: Stable Ambulatory Status: Wheelchair Discharge Destination: Nursing Home Transportation: Other Accompanied By: daughter Schedule Follow-up Appointment: Yes Medication Reconciliation completed Yes and provided to Patient/Care Selby Slovacek: Provided on Clinical Summary of Care: 01/09/2015 Form Type Recipient Paper Patient FD Electronic Signature(s) Signed: 01/09/2015 3:08:18 PM By: Ruthine Dose Entered By: Ruthine Dose on 01/09/2015 15:08:18 [name] ([id number]) -------------------------------------------------------------------------------- Lower Extremity Assessment Details Patient Name: [name]. Date of Service: 01/09/2015 1:30 PM Medical Record Number: [id number] Patient Account Number: [id number] Date of Birth/Sex: March 17, 1940 (75 y.o. Female) Treating RN: Carolyne Fiscal, Debi Primary Care Physician: SYSTEM, PCP Other Clinician: Referring Physician: Durenda Guthrie Treating Physician/Extender: Frann Rider in Treatment: 0 Edema Assessment Assessed: [Left: No] [Right: No] E[Left: dema] [Right: :] Calf Left: Right: Point of Measurement: 33 cm From Medial Instep 36 cm cm Ankle Left: Right: Point of Measurement: 10 cm From Medial Instep 22 cm cm Vascular Assessment Pulses: Posterior Tibial Dorsalis Pedis Palpable: [Left:No] Extremity colors, hair growth, and conditions: Extremity Color: [Left:Normal] Hair Growth on Extremity: [Left:No] Temperature of Extremity: [Left:Warm] Capillary Refill: [Left:> 3 seconds] Toe Nail Assessment Left: Right: Thick: Yes Discolored: Yes Deformed: Yes Improper Length and Hygiene: Yes Electronic Signature(s) Signed: 01/09/2015 5:37:28 PM By:  Alric Quan Entered By: Alric Quan on 01/09/2015 14:18:24 [name], [name] ([id number]) -------------------------------------------------------------------------------- Multi Wound Chart Details Patient Name: [name]. Date of Service: 01/09/2015 1:30 PM Medical Record Number: [id number] Patient Account Number: [id number] Date of Birth/Sex: [birth year] (75 y.o. 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] Comorbid History: [1:Cataracts, Glaucoma, Sleep Apnea, Hypertension, Myocardial Infarction, Type II Diabetes, History of pressure wounds, Rheumatoid Arthritis, Neuropathy] [N/A:N/A]  Date Acquired: [1:07/04/2014] [N/A:N/A] Weeks of Treatment: [1:0] [N/A:N/A] Wound Status: [1:Open] [N/A:N/A] Measurements L x W x D 4.3x4.5x0.2 [N/A:N/A] (cm) Area (cm) : [1:15.197] [N/A:N/A] Volume (cm) : [1:3.039] [N/A:N/A] Classification: [1:Full Thickness Without Exposed Support Structures] [N/A:N/A] HBO Classification: [1:Grade 0] [N/A:N/A] Exudate Amount: [1:Large] [N/A:N/A] Exudate Type: [1:Purulent] [N/A:N/A] Exudate Color: [1:yellow, brown, green] [N/A:N/A] Foul Odor After [1:Yes] [N/A:N/A] Cleansing: Odor Anticipated Due to No [N/A:N/A] Product Use: [name], [name] ([id number]) Wound Margin: Thickened N/A N/A Granulation Amount: None Present (0%) N/A N/A Necrotic Amount: Large (67-100%) N/A N/A Exposed Structures: Fascia: No N/A N/A Fat: No Tendon: No Muscle:  No Joint: No Bone: No Limited to Skin Breakdown Epithelialization: None N/A N/A Periwound Skin Texture: Edema: Yes N/A N/A Periwound Skin Maceration: No N/A N/A Moisture: Moist: No Periwound Skin Color: No Abnormalities Noted N/A N/A Temperature: No Abnormality N/A N/A Tenderness on Yes N/A N/A Palpation: Wound Preparation: Ulcer Cleansing: N/A N/A Rinsed/Irrigated with Saline Topical Anesthetic Applied: Other: lidocaine 4% Treatment Notes Electronic Signature(s) Signed: 01/09/2015 5:37:28 PM By: Alric Quan Entered By: Alric Quan on 01/09/2015 14:24:05 [name], [name] ([id number]) -------------------------------------------------------------------------------- [place] Details Patient Name: [name], [name]. Date of Service: 01/09/2015 1:30 PM Medical Record Number: [id number] Patient Account Number: [id number] Date of Birth/Sex: 1940/10/19 (75 y.o. Female) Treating RN: Carolyne Fiscal, Debi Primary Care Physician: SYSTEM, PCP Other Clinician: Referring Physician: Durenda Guthrie Treating Physician/Extender: Frann Rider in Treatment: 0 Active Inactive Abuse / Safety / Falls / Self Care Management Nursing Diagnoses: Knowledge deficit related to: safety; personal, health (wound), emergency Potential for falls Goals: Patient will remain injury free Date Initiated: 01/09/2015 Goal Status: Active Patient/caregiver will verbalize understanding of skin care regimen Date Initiated: 01/09/2015 Goal Status: Active Patient/caregiver will verbalize/demonstrate measures taken to improve the patient's personal safety Date Initiated: 01/09/2015 Goal Status: Active Interventions: Assess fall risk on admission and as needed Assess: immobility, friction, shearing, incontinence upon admission and as needed Notes: Nutrition Nursing Diagnoses: Imbalanced nutrition Goals: Patient/caregiver agrees to and verbalizes understanding of need to use nutritional  supplements and/or vitamins as prescribed Date Initiated: 01/09/2015 Goal Status: Active Patient/caregiver will maintain therapeutic glucose control Date Initiated: 01/09/2015 Goal Status: Active [name], [name] ([id number]) Interventions: Assess patient nutrition upon admission and as needed per policy Provide education on elevated blood sugars and impact on wound healing Notes: Orientation to the Wound Care Program Nursing Diagnoses: Knowledge deficit related to the wound healing center program Goals: Patient/caregiver will verbalize understanding of the [place] Date Initiated: 01/09/2015 Goal Status: Active Interventions: Provide education on orientation to the wound center Notes: Pain, Acute or Chronic Nursing Diagnoses: Pain Management - Non-cyclic Acute (Procedural) Pain Management - Non-cyclic Chronic Pain Goals: Patient will verbalize adequate pain control and receive pain control interventions during procedures as needed Date Initiated: 01/09/2015 Goal Status: Active Patient/caregiver will verbalize adequate pain control between visits Date Initiated: 01/09/2015 Goal Status: Active Interventions: Assess comfort goal upon admission Complete pain assessment as per visit requirements Notes: Pressure Nursing Diagnoses: Knowledge deficit related to management of pressures ulcers [name], [name] ([id number]) Goals: Patient will remain free from development of additional pressure ulcers Date Initiated: 01/09/2015 Goal Status: Active Interventions: Assess: immobility, friction, shearing, incontinence upon admission and as needed Assess offloading mechanisms upon admission and as needed Notes: Wound/Skin Impairment Nursing Diagnoses: Impaired tissue integrity Goals: Patient/caregiver will verbalize understanding of skin care regimen Date Initiated: 01/09/2015 Goal Status: Active Ulcer/skin breakdown will have a volume reduction of 30% by week 4  Date  Initiated: 01/09/2015 Goal Status: Active Ulcer/skin breakdown will have a volume reduction of 50% by week 8 Date Initiated: 01/09/2015 Goal Status: Active Ulcer/skin breakdown will have a volume reduction of 80% by week 12 Date Initiated: 01/09/2015 Goal Status: Active Interventions: Assess patient/caregiver ability to perform ulcer/skin care regimen upon admission and as needed Assess ulceration(s) every visit Notes: Electronic Signature(s) Signed: 01/09/2015 5:37:28 PM By: Alric Quan Entered By: Alric Quan on 01/09/2015 17:30:16 [name] ([id number]) -------------------------------------------------------------------------------- Pain Assessment Details Patient Name: [name]. Date of Service: 01/09/2015 1:30 PM Medical Record Number: [id number] Patient Account Number: [id number] Date of Birth/Sex: December 13, 1940 (75 y.o. Female) Treating RN: Carolyne Fiscal, Debi Primary Care Physician: SYSTEM, PCP Other Clinician: Referring Physician: Durenda Guthrie Treating Physician/Extender: Frann Rider in Treatment: 0 Active Problems Location of Pain Severity and Description of Pain Patient Has Paino No Site Locations Pain Management and Medication Current Pain Management: Electronic Signature(s) Signed: 01/09/2015 5:37:28 PM By: Alric Quan Entered By: Alric Quan on 01/09/2015 13:50:53 [name] ([id number]) -------------------------------------------------------------------------------- Patient/Caregiver Education Details Patient Name: [name] Date of Service: 01/09/2015 1:30 PM Medical Record Number: [id number] Patient Account Number: [id number] Date of Birth/Gender: 10-04-1940 (75 y.o. Female) Treating RN: Carolyne Fiscal, Debi Primary Care Physician: SYSTEM, PCP Other Clinician: Referring Physician: Durenda Guthrie Treating Physician/Extender: Frann Rider in Treatment: 0 Education Assessment Education Provided To: Patient and  Caregiver Education Topics Provided Wound/Skin Impairment: Handouts: Other: change dressing as directed Methods: Demonstration, Explain/Verbal Responses: State content correctly Electronic Signature(s) Signed: 01/09/2015 5:37:28 PM By: Alric Quan Entered By: Alric Quan on 01/09/2015 15:04:46 [name], [name] ([id number]) -------------------------------------------------------------------------------- Wound Assessment Details Patient Name: [name]. Date of Service: 01/09/2015 1:30 PM Medical Record Number: [id number] Patient Account Number: [id number] Date of Birth/Sex: 05/02/1940 (75 y.o. Female) Treating RN: Carolyne Fiscal, Debi Primary Care Physician: SYSTEM, PCP Other Clinician: Referring Physician: Durenda Guthrie Treating Physician/Extender: Frann Rider in Treatment: 0 Wound Status Wound Number: 1 Primary Arterial Insufficiency Ulcer Etiology: Wound Location: Left Calcaneus Wound Open Wounding Event: Gradually Appeared Status: Date Acquired: 07/04/2014 Comorbid Cataracts, Glaucoma, Sleep Apnea, Weeks Of Treatment: 0 History: Hypertension, Myocardial Infarction, Clustered Wound: No Type II Diabetes, History of pressure wounds, Rheumatoid Arthritis, Neuropathy Wound Measurements Length: (cm) 4.3 Width: (cm) 4.5 Depth: (cm) 0.2 Area: (cm) 15.197 Volume: (cm) 3.039 % Reduction in Area: % Reduction in Volume: Epithelialization: None Tunneling: No Undermining: No Wound Description Full Thickness Without Classification: Exposed Support Structures Diabetic Severity Grade 0 (Wagner): Wound Margin: Thickened Exudate Amount: Large Exudate Type: Purulent Exudate Color: yellow, brown, green Foul Odor After Cleansing: Yes Due to Product Use: No Wound Bed Granulation Amount: None Present (0%) Exposed Structure Necrotic Amount: Large (67-100%) Fascia Exposed: No Necrotic Quality: Adherent Slough Fat Layer Exposed: No Tendon Exposed:  No Muscle Exposed: No Joint Exposed: No Bone Exposed: No Limited to Skin Breakdown Periwound Skin Texture Texture Color [name], [name]. ([id number]) No Abnormalities Noted: No No Abnormalities Noted: No Localized Edema: Yes Temperature / Pain Moisture Temperature: No Abnormality No Abnormalities Noted: No Tenderness on Palpation: Yes Maceration: No Moist: No Wound Preparation Ulcer Cleansing: Rinsed/Irrigated with Saline Topical Anesthetic Applied: Other: lidocaine 4%, Treatment Notes Wound #1 (Left Calcaneus) 1. Cleansed with: Clean wound with Normal Saline 2. Anesthetic Topical Lidocaine 4% cream to wound bed prior to debridement 3. Peri-wound Care: Barrier cream 4. Dressing Applied: Santyl Ointment 5. Secondary Dressing Applied Bordered Foam Dressing Dry Gauze Notes heel protector Electronic Signature(s) Signed: 01/09/2015 5:37:28 PM By: Alric Quan  Entered By: Alric Quan on 01/09/2015 14:22:04 [name] ([id number]) -------------------------------------------------------------------------------- Vitals Details Patient Name: [name]. Date of Service: 01/09/2015 1:30 PM Medical Record Number: [id number] Patient Account Number: [id number] Date of Birth/Sex: Sep 20, 1940 (75 y.o. Female) Treating RN: Carolyne Fiscal, Debi Primary Care Physician: SYSTEM, PCP Other Clinician: Referring Physician: Durenda Guthrie Treating Physician/Extender: Frann Rider in Treatment: 0 Vital Signs Time Taken: 13:51 Temperature (F): 97.6 Height (in): 65 Pulse (bpm): 88 Source: Stated Respiratory Rate (breaths/min): 18 Weight (lbs): 185 Blood Pressure (mmHg): 179/98 Source: Stated Reference Range: 80 - 120 mg / dl Body Mass Index (BMI): 30.8 Electronic Signature(s) Signed: 01/09/2015 5:37:28 PM By: Alric Quan Entered By: Alric Quan on 01/09/2015 13:54:22

## 2015-01-10 NOTE — Progress Notes (Signed)
TEXANNA, MCCLAUGHERTY ([id number]) Visit Report for 01/09/2015 Abuse/Suicide Risk Screen Details Patient Name: [name], [name]. Date of Service: 01/09/2015 1:30 PM Medical Record Number: [id number] Patient Account Number: [id number] Date of Birth/Sex: 1940/05/21 (75 y.o. Female) Treating RN: Carolyne Fiscal, Debi Primary Care Physician: SYSTEM, PCP Other Clinician: Referring Physician: Durenda Guthrie Treating Physician/Extender: Frann Rider in Treatment: 0 Abuse/Suicide Risk Screen Items Answer ABUSE/SUICIDE RISK SCREEN: Has anyone close to you tried to hurt or harm you recentlyo No Do you feel uncomfortable with anyone in your familyo No Has anyone forced you do things that you didnot want to doo No Do you have any thoughts of harming yourselfo No Patient displays signs or symptoms of abuse and/or neglect. No Electronic Signature(s) Signed: 01/09/2015 5:37:28 PM By: Alric Quan Entered By: Alric Quan on 01/09/2015 14:09:11 [name] ([id number]) -------------------------------------------------------------------------------- Activities of Daily Living Details Patient Name: [name], [name]. Date of Service: 01/09/2015 1:30 PM Medical Record Number: [id number] Patient Account Number: [id number] Date of Birth/Sex: 1940-03-08 (75 y.o. Female) Treating RN: Carolyne Fiscal, Debi Primary Care Physician: SYSTEM, PCP Other Clinician: Referring Physician: Durenda Guthrie Treating Physician/Extender: Frann Rider in Treatment: 0 Activities of Daily Living Items Answer Activities of Daily Living (Please select one for each item) Drive Automobile Not Able Take Medications Not Able Use Telephone Not Able Care for Appearance Not Able Use Toilet Need Assistance Bath / Shower Not Able Dress Self Not Able Feed Self Need Assistance Walk Not Able Get In / Out Bed Not Able Housework Not Able Prepare Meals Not Able Handle Money Not Able Shop for Self Not Able Electronic  Signature(s) Signed: 01/09/2015 5:37:28 PM By: Alric Quan Entered By: Alric Quan on 01/09/2015 14:09:47 [name] ([id number]) -------------------------------------------------------------------------------- Education Assessment Details Patient Name: [name] Date of Service: 01/09/2015 1:30 PM Medical Record Number: [id number] Patient Account Number: [id number] Date of Birth/Sex: 06/11/1940 (75 y.o. Female) Treating RN: Carolyne Fiscal, Debi Primary Care Physician: SYSTEM, PCP Other Clinician: Referring Physician: Durenda Guthrie Treating Physician/Extender: Frann Rider in Treatment: 0 Primary Learner Assessed: Patient Learning Preferences/Education Level/Primary Language Learning Preference: Explanation Highest Education Level: College or Above Preferred Language: English Cognitive Barrier Assessment/Beliefs Language Barrier: No Translator Needed: No Memory Deficit: No Emotional Barrier: No Cultural/Religious Beliefs Affecting Medical No Care: Physical Barrier Assessment Impaired Vision: Yes Glasses Impaired Hearing: No Decreased Hand dexterity: No Knowledge/Comprehension Assessment Knowledge Level: High Comprehension Level: High Ability to understand written High instructions: Ability to understand verbal High instructions: Motivation Assessment Anxiety Level: Calm Cooperation: Cooperative Education Importance: Acknowledges Need Interest in Health Problems: Asks Questions Perception: Coherent Willingness to Engage in Self- High Management Activities: Readiness to Engage in Self- High Management Activities: Electronic Signature(s) [name], [name] ([id number]) Signed: 01/09/2015 5:37:28 PM By: Alric Quan Entered By: Alric Quan on 01/09/2015 14:10:21 [name], [name] ([id number]) -------------------------------------------------------------------------------- Fall Risk Assessment Details Patient Name: [name]. Date of Service: 01/09/2015 1:30 PM Medical Record Number: [id number] Patient Account Number: [id number] Date of Birth/Sex: 1940/07/05 (75 y.o. Female) Treating RN: Carolyne Fiscal, Debi Primary Care Physician: SYSTEM, PCP Other Clinician: Referring Physician: Durenda Guthrie Treating Physician/Extender: Frann Rider in Treatment: 0 Fall Risk Assessment Items Have you had 2 or more falls in the last 12 monthso 0 Yes Have you had any fall that resulted in injury in the last 12 monthso 0 Yes FALL RISK ASSESSMENT: History of falling - immediate or within 3 months 0 No Secondary diagnosis 15 Yes Ambulatory aid None/bed rest/wheelchair/nurse 0 Yes Crutches/cane/walker 0 No Furniture  0 No IV Access/Saline Lock 0 No Gait/Training Normal/bed rest/immobile 0 No Weak 10 Yes Impaired 20 Yes Mental Status Oriented to own ability 0 No Electronic Signature(s) Signed: 01/09/2015 5:37:28 PM By: Alric Quan Entered By: Alric Quan on 01/09/2015 14:10:55 [name] ([id number]) -------------------------------------------------------------------------------- Foot Assessment Details Patient Name: [name]. Date of Service: 01/09/2015 1:30 PM Medical Record Number: [id number] Patient Account Number: [id number] Date of Birth/Sex: 1940-06-08 (75 y.o. Female) Treating RN: Carolyne Fiscal, Debi Primary Care Physician: SYSTEM, PCP Other Clinician: Referring Physician: Durenda Guthrie Treating Physician/Extender: Frann Rider in Treatment: 0 Foot Assessment Items Site Locations + = Sensation present, - = Sensation absent, C = Callus, U = Ulcer R = Redness, W = Warmth, M = Maceration, PU = Pre-ulcerative lesion F = Fissure, S = Swelling, D = Dryness Assessment Right: Left: Other Deformity: No No Prior Foot Ulcer: No No Prior Amputation: No No Charcot Joint: No No Ambulatory Status: Non-ambulatory Assistance Device: Wheelchair Gait: Administrator, arts) Signed:  01/09/2015 5:37:28 PM By: Alric Quan Entered By: Alric Quan on 01/09/2015 14:12:15 [name] ([id number]) -------------------------------------------------------------------------------- Nutrition Risk Assessment Details Patient Name: [name]. Date of Service: 01/09/2015 1:30 PM Medical Record Number: [id number] Patient Account Number: [id number] Date of Birth/Sex: 06-20-1940 (75 y.o. Female) Treating RN: Carolyne Fiscal, Debi Primary Care Physician: SYSTEM, PCP Other Clinician: Referring Physician: Durenda Guthrie Treating Physician/Extender: Frann Rider in Treatment: 0 Height (in): 65 Weight (lbs): 185 Body Mass Index (BMI): 30.8 Nutrition Risk Assessment Items NUTRITION RISK SCREEN: I have an illness or condition that made me change the kind and/or 2 Yes amount of food I eat I eat fewer than two meals per day 0 No I eat few fruits and vegetables, or milk products 0 No I have three or more drinks of beer, liquor or wine almost every day 0 No I have tooth or mouth problems that make it hard for me to eat 0 No I don't always have enough money to buy the food I need 0 No I eat alone most of the time 0 No I take three or more different prescribed or over-the-counter drugs a 1 Yes day Without wanting to, I have lost or gained 10 pounds in the last six 2 Yes months I am not always physically able to shop, cook and/or feed myself 2 Yes Nutrition Protocols Good Risk Protocol Moderate Risk Protocol Electronic Signature(s) Signed: 01/09/2015 5:37:28 PM By: Alric Quan Entered By: Alric Quan on 01/09/2015 14:11:27

## 2015-01-14 ENCOUNTER — Inpatient Hospital Stay: Payer: Medicare (Managed Care) | Attending: Oncology | Admitting: Oncology

## 2015-01-14 VITALS — BP 160/88 | HR 76 | Temp 97.5°F | Wt 279.0 lb

## 2015-01-14 DIAGNOSIS — I252 Old myocardial infarction: Secondary | ICD-10-CM | POA: Insufficient documentation

## 2015-01-14 DIAGNOSIS — R161 Splenomegaly, not elsewhere classified: Secondary | ICD-10-CM | POA: Insufficient documentation

## 2015-01-14 DIAGNOSIS — M542 Cervicalgia: Secondary | ICD-10-CM | POA: Diagnosis not present

## 2015-01-14 DIAGNOSIS — C8318 Mantle cell lymphoma, lymph nodes of multiple sites: Secondary | ICD-10-CM

## 2015-01-14 DIAGNOSIS — M1990 Unspecified osteoarthritis, unspecified site: Secondary | ICD-10-CM | POA: Insufficient documentation

## 2015-01-14 DIAGNOSIS — Z87891 Personal history of nicotine dependence: Secondary | ICD-10-CM | POA: Insufficient documentation

## 2015-01-14 DIAGNOSIS — E785 Hyperlipidemia, unspecified: Secondary | ICD-10-CM | POA: Insufficient documentation

## 2015-01-14 DIAGNOSIS — K219 Gastro-esophageal reflux disease without esophagitis: Secondary | ICD-10-CM | POA: Diagnosis not present

## 2015-01-14 DIAGNOSIS — Z79899 Other long term (current) drug therapy: Secondary | ICD-10-CM | POA: Diagnosis not present

## 2015-01-14 DIAGNOSIS — Z7982 Long term (current) use of aspirin: Secondary | ICD-10-CM | POA: Insufficient documentation

## 2015-01-14 DIAGNOSIS — Z794 Long term (current) use of insulin: Secondary | ICD-10-CM | POA: Insufficient documentation

## 2015-01-14 DIAGNOSIS — E119 Type 2 diabetes mellitus without complications: Secondary | ICD-10-CM | POA: Insufficient documentation

## 2015-01-14 DIAGNOSIS — Z8673 Personal history of transient ischemic attack (TIA), and cerebral infarction without residual deficits: Secondary | ICD-10-CM | POA: Diagnosis not present

## 2015-01-14 DIAGNOSIS — I10 Essential (primary) hypertension: Secondary | ICD-10-CM | POA: Diagnosis not present

## 2015-01-14 DIAGNOSIS — R590 Localized enlarged lymph nodes: Secondary | ICD-10-CM | POA: Insufficient documentation

## 2015-01-14 DIAGNOSIS — Z9481 Bone marrow transplant status: Secondary | ICD-10-CM | POA: Insufficient documentation

## 2015-01-14 NOTE — Progress Notes (Signed)
[place]  [phone] [phone]  ID: [name] OB: 30-May-1940  MR#: [id number]  AOZ#:[id number]  Patient Care Team: Pcp Not In System as PCP - General  CHIEF COMPLAINT:  Chief Complaint  Patient presents with  . Lymphoma    mantle cell lymphoma follow up    INTERVAL HISTORY: Patient returns to clinic today for PET scan results. 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 She complains of right lower abdomen pain and left side neck pain. She has no neurologic complaints. She denies any fevers. She has a good appetite and denies weight loss. She denies any night sweats. She has no chest pain or shortness of breath. She denies any nausea, vomiting, constipation, or diarrhea. She has no urinary complaints. Patient otherwise feels well and offers no further specific complaints.  REVIEW OF SYSTEMS:   Review of Systems  Constitutional: Positive for malaise/fatigue. Negative for fever and weight loss.  Respiratory: Negative.   Cardiovascular: Negative.   Gastrointestinal: Positive for abdominal pain.  Musculoskeletal: Positive for neck pain.  Neurological: Positive for weakness.    As per HPI. Otherwise, a complete review of systems is negatve.  PAST MEDICAL HISTORY: Past Medical History  Diagnosis Date  . Diabetes mellitus without complication ([place])   . Arthritis   . Non Hodgkin's lymphoma ([place]) 2009    s/p stem cell transplant  . Hypertension   . MI (myocardial infarction) ([place])     1990s  . TIA (transient ischemic attack)   . Spinal stenosis   . HLD (hyperlipidemia)   . GERD (gastroesophageal reflux disease)     PAST SURGICAL HISTORY: Past Surgical History  Procedure Laterality Date  . Abdominal hysterectomy    . Toe amputation Left   . Eye surgery Bilateral     FAMILY HISTORY Family History  Problem Relation Age of Onset  .  Hypertension Mother   . Heart disease Mother   . Diabetes Father   . Breast cancer Sister        ADVANCED DIRECTIVES:    HEALTH MAINTENANCE: Social History  Substance Use Topics  . Smoking status: Former Research scientist (life sciences)  . Smokeless tobacco: Not on file  . Alcohol Use: No     Colonoscopy:  PAP:  Bone density:  Lipid panel:  Allergies  Allergen Reactions  . Penicillins Hives    Current Outpatient Prescriptions  Medication Sig Dispense Refill  . acetaminophen (TYLENOL) 500 MG tablet Take 500 mg by mouth every 8 (eight) hours as needed.    Marland Kitchen amLODipine (NORVASC) 5 MG tablet Take 5 mg by mouth daily.    Marland Kitchen aspirin EC 81 MG tablet Take 81 mg by mouth daily.    Marland Kitchen atorvastatin (LIPITOR) 80 MG tablet Take 80 mg by mouth daily.    . brimonidine-timolol (COMBIGAN) 0.2-0.5 % ophthalmic solution Place 1 drop into both eyes every 12 (twelve) hours.    . Carboxymethylcellul-Glycerin (OPTIVE) 0.5-0.9 % SOLN Apply 1-2 drops to eye 3 (three) times daily as needed.    . chlorthalidone (HYGROTON) 25 MG tablet Take 25 mg by mouth daily.    . citalopram (CELEXA) 20 MG tablet Take 20 mg by mouth daily.    Marland Kitchen docusate sodium (COLACE) 100 MG capsule Take 100 mg by mouth at bedtime as needed for mild constipation.    . furosemide (LASIX) 40 MG tablet Take 1 tablet (40 mg total) by mouth daily.  30 tablet 0  . insulin aspart (NOVOLOG) 100 UNIT/ML injection Inject 0-9 Units into the skin 3 (three) times daily with meals. 10 mL 11  . lisinopril (PRINIVIL,ZESTRIL) 40 MG tablet Take 40 mg by mouth daily.    Marland Kitchen omeprazole (PRILOSEC) 20 MG capsule Take 20 mg by mouth daily.    . polyethylene glycol (MIRALAX / GLYCOLAX) packet Take 17 g by mouth daily as needed.    . sodium chloride 1 G tablet Take 1 tablet (1 g total) by mouth 2 (two) times daily with a meal. 20 tablet 0  . Travoprost, BAK Free, (TRAVATAN) 0.004 % SOLN ophthalmic solution Place 1 drop into both eyes.    Marland Kitchen trolamine salicylate (ASPERCREME) 10 %  cream Apply 1 application topically as needed for muscle pain (left hip).    . Vitamin D, Ergocalciferol, (DRISDOL) 50000 UNITS CAPS capsule Take 50,000 Units by mouth every 30 (thirty) days.    . Zinc Oxide (DESITIN) 13 % CREA Apply topically.    . gabapentin (NEURONTIN) 300 MG capsule TAKE ONE CAPSULE BY MOUTH EVERY 12 HOURS FOR PAIN  0   No current facility-administered medications for this visit.    OBJECTIVE: Filed Vitals:   01/14/15 1104  BP: 160/88  Pulse: 76  Temp: 97.5 F (36.4 C)     Body mass index is 40.03 kg/(m^2).    ECOG FS:1 - Symptomatic but completely ambulatory  General: Well-developed, well-nourished, no acute distress. Eyes: Pink conjunctiva, anicteric sclera. Lungs: Clear to auscultation bilaterally. Heart: Regular rate and rhythm. No rubs, murmurs, or gallops. Abdomen: Soft, nontender, nondistended. No organomegaly noted, normoactive bowel sounds. Musculoskeletal: No edema, cyanosis, or clubbing.  Neuro: Alert, answering all questions appropriately. Cranial nerves grossly intact. Skin: No rashes or petechiae noted. Psych: Normal affect. Lymphatics: No cervical, calvicular, axillary or inguinal LAD.   LAB RESULTS:  Lab Results  Component Value Date   NA 130* 11/20/2014   K 3.7 11/20/2014   CL 96* 11/20/2014   CO2 25 11/20/2014   GLUCOSE 157* 11/20/2014   BUN 7 11/20/2014   CREATININE 1.21* 11/20/2014   CALCIUM 8.9 11/20/2014   PROT 6.9 10/31/2014   ALBUMIN 3.8 10/31/2014   AST 41 10/31/2014   ALT 18 10/31/2014   ALKPHOS 67 10/31/2014   BILITOT 1.2 10/31/2014   GFRNONAA 43* 11/20/2014   GFRAA 50* 11/20/2014    Lab Results  Component Value Date   WBC 7.1 11/14/2014   NEUTROABS 5.8 11/13/2014   HGB 10.7* 11/14/2014   HCT 31.5* 11/14/2014   MCV 85.1 11/14/2014   PLT 170 11/14/2014     STUDIES:    CT Chest/abdomen: Patchy/nodular ground-glass opacities in the posterior right upper lobe and right lower lobe, suspicious for pneumonia,  possibly on the basis of aspiration.  No evidence of bowel obstruction. Normal appendix. Cholelithiasis, without associated inflammatory changes. No CT findings to account for the patient's left upper quadrant abdominal pain.  Dominant 17 mm short axis left supraclavicular node, new, suspicious for lymphomatous recurrence given the patient's history. Additional mildly prominent lymph nodes in the chest, abdomen, and pelvis, as Above.   Dg Os Calcis Left  01/09/2015  CLINICAL DATA:  Osteomyelitis left foot of unspecified chronicity, orthopedic screw through calcaneus for over 50 years, current history of diabetes EXAM: LEFT OS CALCIS - 2+ VIEW COMPARISON:  None. FINDINGS: Soft tissue ulcer posteriorly over the posterior calcaneal tuberosity. The cortex of the posterior calcaneal tuberosity is inconsistent and there is periosteal reaction. There is  a moderate heel spur. There is a orthopedic screw through the talonavicular joint which shows moderate arthropathy. There is mild arthropathy at the ankle. IMPRESSION: Evidence of active likely subacute osteomyelitis of the calcaneus Electronically Signed   By: Skipper Cliche M.D.   On: 01/09/2015 16:02   Nm Pet Image Restag (ps) Skull Base To Thigh  01/07/2015  CLINICAL DATA:  Initial treatment strategy for mantle cell lymphoma. EXAM: NUCLEAR MEDICINE PET SKULL BASE TO THIGH TECHNIQUE: 12.0 mCi F-18 FDG was injected intravenously. Full-ring PET imaging was performed from the skull base to thigh after the radiotracer. CT data was obtained and used for attenuation correction and anatomic localization. FASTING BLOOD GLUCOSE:  Value: 161 mg/dl COMPARISON:  CT chest abdomen pelvis 10/31/2014. FINDINGS: NECK Numerous lymph nodes in the left level III and left supraclavicular region measure up to 2.0 cm in short axis (image 60), previously 1.6 cm. SUV max, 3.5. CT images show no acute findings. CHEST No hypermetabolic mediastinal, hilar or axillary lymph nodes. No  hypermetabolic pulmonary nodules. Heart is at the upper limits of normal in size. Coronary artery calcification. No pericardial effusion. Small bilateral pleural effusions. ABDOMEN/PELVIS Spleen measures approximately 26 cm and is diffusely hypermetabolic with respect to the liver. There are mildly hypermetabolic porta hepatis, gastrohepatic ligament and retroperitoneal lymph nodes. Index aortocaval lymph node measures 10 mm (CT image 163) with an SUV max of 3.1. Intense hypermetabolism is seen in the cecum, with an SUV max of 41.6. A definite corresponding CT abnormality is not identified) CT image 188). Questionable posterior wall thickening on image 186. However, the area of hypermetabolism appears slightly inferior and anterior to this. No abnormal hypermetabolism in the liver, adrenal glands or pancreas. CT images show possible slight irregularity of the liver margin. Small stones are seen in the gallbladder. Adrenal glands, kidneys, pancreas, stomach and bowel are grossly unremarkable. No free fluid. SKELETON No abnormal osseous hypermetabolism. Degenerative changes are seen in the spine and hips. IMPRESSION: 1. Hypermetabolic enlarged spleen and mildly hypermetabolic lymph nodes in the left neck, left supraclavicular region and abdomen, indicative of residual/recurrent lymphoma. 2. Intense hypermetabolism associated with the cecum. Definite CT correlate not identified. Difficult to definitively exclude lymphomatous involvement or colon carcinoma. 3. Small bilateral effusions. 4. Coronary artery calcification. 5. Question cirrhosis. 6. Cholelithiasis. Electronically Signed   By: Lorin Picket M.D.   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  PLAN:    1. Mantle cell lymphoma: CT scan results reviewed independently with increasing lymphadenopathy concerning for recurrence. PET scan also reviewed independently revealing intense  hypermetabolism associated with the cecum, hypermetabolic enlarged spleen and mildly hypermetabolic lymph nodes in the left neck, left supraclavicular region and abdomen, indicative of residual/recurrent lymphoma. This may be the source of patients pain. Recommendation at cancer conference was to consider colonoscopy to further evaluate her cecum. Referral to GI has been made.  Patient may require biopsy of a lymph node and bone marrow biopsy in the near future.  Return to clinic after colonoscopy to discuss the results and further diagnostic planning if necessary.  Approximately 30 minutes was spent in discussion of which greater than 50% was consultation.  Patient expressed understanding and was in agreement with this plan. She also understands that She can call clinic at any time with any questions, concerns, or complaints.    Mayra Reel, NP   01/14/2015 1:01 PM   Patient was seen and evaluated  independently and I agree with the assessment and plan as dictated above.  Lloyd Huger, MD

## 2015-01-16 ENCOUNTER — Telehealth: Payer: Self-pay

## 2015-01-16 ENCOUNTER — Other Ambulatory Visit: Payer: Self-pay

## 2015-01-16 ENCOUNTER — Encounter: Payer: Medicare (Managed Care) | Admitting: Surgery

## 2015-01-16 DIAGNOSIS — E11621 Type 2 diabetes mellitus with foot ulcer: Secondary | ICD-10-CM | POA: Diagnosis not present

## 2015-01-16 DIAGNOSIS — R933 Abnormal findings on diagnostic imaging of other parts of digestive tract: Secondary | ICD-10-CM

## 2015-01-16 MED ORDER — PEG 3350-KCL-NA BICARB-NACL 420 G PO SOLR: 4000.0000 mL | ORAL | Status: AC

## 2015-01-16 NOTE — Progress Notes (Addendum)
Tricia, Lee ([id number]) Visit Report for 01/16/2015 Chief Complaint Document Details Patient Name: [name], [name]. Date of Service: 01/16/2015 9:30 AM Medical Record Number: [id number] Patient Account Number: [id number] Date of Birth/Sex: 18-Nov-1940 (75 y.o. Female) Treating RN: Primary Care Physician: SYSTEM, PCP Other Clinician: Referring Physician: Durenda Guthrie Treating Physician/Extender: Frann Rider in Treatment: 1 Information Obtained from: Patient Chief Complaint Patients presents for treatment of an open diabetic ulcer and pressure ulcer to her left heel which she has had for 6 months. Electronic Signature(s) Signed: 01/16/2015 10:39:24 AM By: Christin Fudge MD, FACS Entered By: Christin Fudge on 01/16/2015 10:39:24 [name], [name] ([id number]) -------------------------------------------------------------------------------- Debridement Details Patient Name: Tricia, Lee. Date of Service: 01/16/2015 9:30 AM Medical Record Number: [id number] Patient Account Number: [id number] Date of Birth/Sex: March 12, 1940 (75 y.o. Female) Treating RN: Primary Care Physician: SYSTEM, PCP Other Clinician: Referring Physician: Durenda Guthrie Treating Physician/Extender: Frann Rider in Treatment: 1 Debridement Performed for Wound #1 Left Calcaneus Assessment: Performed By: Physician Christin Fudge, MD Debridement: Debridement Pre-procedure Yes Verification/Time Out Taken: Start Time: 10:30 Pain Control: Lidocaine 4% Topical Solution Level: Skin/Subcutaneous Tissue Total Area Debrided (L x 3.7 (cm) x 4.1 (cm) = 15.17 (cm) W): Tissue and other Viable, Non-Viable, Eschar, Fibrin/Slough, Subcutaneous material debrided: Instrument: Forceps, Scissors Bleeding: Moderate Hemostasis Achieved: Silver Nitrate End Time: 10:33 Procedural Pain: 0 Post Procedural Pain: 0 Response to Treatment: Procedure was tolerated well Post Debridement Measurements of Total  Wound Length: (cm) 3.7 Width: (cm) 4.1 Depth: (cm) 0.4 Volume: (cm) 4.766 Post Procedure Diagnosis Same as Pre-procedure Electronic Signature(s) Signed: 01/16/2015 10:39:17 AM By: Christin Fudge MD, FACS Entered By: Christin Fudge on 01/16/2015 10:39:17 [name] ([id number]) -------------------------------------------------------------------------------- HPI Details Patient Name: [name]. Date of Service: 01/16/2015 9:30 AM Medical Record Number: [id number] Patient Account Number: [id number] Date of Birth/Sex: 11/24/1940 (75 y.o. Female) Treating RN: Primary Care Physician: SYSTEM, PCP Other Clinician: Referring Physician: Durenda Guthrie Treating Physician/Extender: Frann Rider in Treatment: 1 History of Present Illness Location: ulceration of the left heel and calcaneal region Quality: Patient reports experiencing a dull pain to affected area(s). Severity: Patient states wound are getting worse. Duration: Patient has had the wound for >6 months prior to seeking treatment at the wound center Timing: Pain in wound is Intermittent (comes and goes Context: The wound appeared gradually over time Modifying Factors: Other treatment(s) tried include: Santyl ointment locally Associated Signs and Symptoms: Patient reports having difficulty standing for long periods. HPI Description: 75 year old female who comes to see [place] for a left heel pressure injury which she has had for 6 months. 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 She is status post stem cell transplant. as per her oncologist Dr. Grayland Ormond she has a PET scan to be done and may need a lymph node biopsy  and a bone marrow biopsy in the near future. He is also status post abdominal hysterectomy, left toe amputation and eyes surgery. 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 discussed this result with the family at the bedside and recommended surgical debridement in the operating room so as to clean this out to the best of our ability. However the daughter informs me that a lot  of other issues have to be addressed at the present time including a possible colon cancer and a recurrence of her lymphoma. They would like to defer surgical debridement of the heel to a later date Electronic Signature(s) Signed: 01/16/2015 10:40:45 AM By: Christin Fudge MD, FACS Previous Signature: 01/16/2015 10:12:29 AM Version By: Christin Fudge MD, FACS Entered By: Christin Fudge on 01/16/2015 10:40:45 [name], [name] ([id number]) -------------------------------------------------------------------------------- Physical Exam Details Patient Name: [name], [name]. Date of Service: 01/16/2015 9:30 AM Medical Record Number: [id number] Patient Account Number: [id number] Date of Birth/Sex: [birth year] (75 y.o. Female) Treating RN: Primary Care Physician: SYSTEM, PCP Other Clinician: Referring Physician: Durenda Guthrie Treating Physician/Extender: Frann Rider in Treatment: 1 Constitutional . Pulse regular. Respirations normal and unlabored. Afebrile. . Eyes Nonicteric. Reactive to light. Ears, Nose, Mouth, and Throat Lips, teeth, and gums WNL.Marland Kitchen Moist mucosa without lesions. Neck supple and nontender. No palpable supraclavicular or cervical adenopathy. Normal sized without goiter. Respiratory WNL. No retractions.. Cardiovascular Pedal Pulses WNL. No clubbing, cyanosis or edema. Lymphatic No adneopathy. No adenopathy. No adenopathy. Musculoskeletal Adexa without tenderness or enlargement.. Digits and nails w/o clubbing, cyanosis, infection,  petechiae, ischemia, or inflammatory conditions.. Integumentary (Hair, Skin) No suspicious lesions. No crepitus or fluctuance. No peri-wound warmth or erythema. No masses.Marland Kitchen Psychiatric Judgement and insight Intact.. No evidence of depression, anxiety, or agitation.. Notes she continues to have significant debris in the left subcutaneous tissue and this was sharply debrided with the forceps and scissors down to some brisk bleeding which was controlled with Silver nitrate. Electronic Signature(s) Signed: 01/16/2015 10:41:18 AM By: Christin Fudge MD, FACS Entered By: Christin Fudge on 01/16/2015 10:41:17 [name], [name] ([id number]) -------------------------------------------------------------------------------- Physician Orders Details Patient Name: [name], [name]. Date of Service: 01/16/2015 9:30 AM Medical Record Number: [id number] Patient Account Number: [id number] Date of Birth/Sex: 1940/03/29 (75 y.o. Female) Treating RN: Montey Hora Primary Care Physician: SYSTEM, PCP Other Clinician: Referring Physician: Durenda Guthrie Treating Physician/Extender: Frann Rider in Treatment: 1 Verbal / Phone Orders: Yes Clinician: Dorthy, Joanna Read Back and Verified: Yes Diagnosis Coding Wound Cleansing Wound #1 Left Calcaneus o Clean wound with Normal Saline. Anesthetic Wound #1 Left Calcaneus o Topical Lidocaine 4% cream applied to wound bed prior to debridement Skin Barriers/Peri-Wound Care Wound #1 Left Calcaneus o Barrier cream Primary Wound Dressing Wound #1 Left Calcaneus o Santyl Ointment Secondary Dressing Wound #1 Left Calcaneus o Dry Gauze o Boardered Foam Dressing - heel protector Dressing Change Frequency Wound #1 Left Calcaneus o Change dressing every day. - PLEASE CHANGE DAILY Follow-up Appointments Wound #1 Left Calcaneus o Return Appointment in 1 week. Off-Loading o Other: - When lying in bed please float the heels Medications-please  add to medication list. Wound #1 Left Calcaneus o Other: - Please give pt Vitamin C. Zinc, and Multi Vitamins daily. LARETHA, LUEPKE ([id number]) Electronic Signature(s) Signed: 01/16/2015 3:13:05 PM By: Christin Fudge MD, FACS Signed: 01/16/2015 3:21:09 PM By: Montey Hora Entered By: Montey Hora on 01/16/2015 10:34:09 [name], [name] ([id number]) -------------------------------------------------------------------------------- Problem List Details Patient Name: [name], [name]. Date of Service: 01/16/2015 9:30 AM Medical Record Number: [id number] Patient Account Number: [id number] Date of Birth/Sex: February 13, 1940 (75 y.o. Female) Treating RN: Primary Care Physician: SYSTEM, PCP Other Clinician: Referring Physician: Durenda Guthrie Treating Physician/Extender: Frann Rider in Treatment: 1 Active Problems ICD-10 Encounter Code Description Active Date Diagnosis E11.621 Type 2 diabetes mellitus with foot ulcer 01/09/2015 Yes L89.623 Pressure ulcer of left heel, stage 3 01/09/2015 Yes C83.10 Mantle cell lymphoma, unspecified  site 01/09/2015 Yes Z92.21 Personal history of antineoplastic chemotherapy 01/09/2015 Yes Inactive Problems Resolved Problems Electronic Signature(s) Signed: 01/16/2015 10:39:10 AM By: Christin Fudge MD, FACS Entered By: Christin Fudge on 01/16/2015 10:39:10 [name], [name] ([id number]) -------------------------------------------------------------------------------- Progress Note Details Patient Name: [name]. Date of Service: 01/16/2015 9:30 AM Medical Record Number: [id number] Patient Account Number: [id number] Date of Birth/Sex: 1940-05-09 (75 y.o. Female) Treating RN: Primary Care Physician: SYSTEM, PCP Other Clinician: Referring Physician: Durenda Guthrie Treating Physician/Extender: Frann Rider in Treatment: 1 Subjective Chief Complaint Information obtained from Patient Patients presents for treatment of an open diabetic ulcer and  pressure ulcer to her left heel which she has had for 6 months. History of Present Illness (HPI) The following HPI elements were documented for the patient's wound: Location: ulceration of the left heel and calcaneal region Quality: Patient reports experiencing a dull pain to affected area(s). Severity: Patient states wound are getting worse. Duration: Patient has had the wound for >6 months prior to seeking treatment at the wound center Timing: Pain in wound is Intermittent (comes and goes Context: The wound appeared gradually over time Modifying Factors: Other treatment(s) tried include: Santyl ointment locally Associated Signs and Symptoms: Patient reports having difficulty standing for long periods. 75 year old female who comes to see [place] for a left heel pressure injury which she has had for 6 months. 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 She is status post stem cell transplant. as per her oncologist Dr. Grayland Ormond she has a PET scan to be done and may need a lymph node biopsy and a bone marrow biopsy in the near future. He is also status post abdominal hysterectomy, left toe amputation and eyes surgery. 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 discussed this result with the family at the bedside and recommended surgical debridement in the operating room so as to clean this out to the best of our ability. However the daughter informs me that a lot of other issues have to be addressed at the present time including a possible colon cancer and a recurrence of her lymphoma. They would like to defer surgical debridement  of the heel to a later date [name], [name] ([id number]) Objective Constitutional Pulse regular. Respirations normal and unlabored. Afebrile. Vitals Time Taken: 10:07 AM, Height: 65 in, Weight: 185 lbs, BMI: 30.8, Temperature: 97.9 F, Pulse: 64 bpm, Respiratory Rate: 18 breaths/min, Blood Pressure: 131/64 mmHg. Eyes Nonicteric. Reactive to light. Ears, Nose, Mouth, and Throat Lips, teeth, and gums WNL.Marland Kitchen Moist mucosa without lesions. Neck supple and nontender. No palpable supraclavicular or cervical adenopathy. Normal sized without goiter. Respiratory WNL. No retractions.. Cardiovascular Pedal Pulses WNL. No clubbing, cyanosis or edema. Lymphatic No adneopathy. No adenopathy. No adenopathy. Musculoskeletal Adexa without tenderness or enlargement.. Digits and nails w/o clubbing, cyanosis, infection, petechiae, ischemia, or inflammatory conditions.Marland Kitchen Psychiatric Judgement and insight Intact.. No evidence of depression, anxiety, or agitation.. General Notes: she continues to have significant debris in the left subcutaneous tissue and this was sharply debrided with the forceps and scissors down to some brisk bleeding which was controlled with Silver nitrate. Integumentary (Hair, Skin) No suspicious lesions. No crepitus or fluctuance. No peri-wound warmth or erythema. No masses.. Wound #1  status is Open. Original cause of wound was Gradually Appeared. The wound is located on the Left Calcaneus. The wound measures 3.7cm length x 4.1cm width x 0.4cm depth; 11.914cm^2 area and 4.766cm^3 volume. The wound is limited to skin breakdown. There is no tunneling or undermining noted. There is a large amount of purulent drainage noted. The wound margin is thickened. There is small (1-33%) red, pink granulation within the wound bed. There is a large (67-100%) amount of necrotic tissue within the wound bed including Adherent Slough. The periwound skin appearance exhibited: Localized Edema.  The periwound skin appearance did not exhibit: Maceration, Moist. Periwound temperature was noted as No [name], LY. ([id number]) Abnormality. The periwound has tenderness on palpation. 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 There was a Skin/Subcutaneous Tissue Debridement ([id number]) debridement with total area of 15.17 sq cm performed by Christin Fudge, MD. with the following instrument(s): Forceps and Scissors to remove Viable and Non-Viable tissue/material including Fibrin/Slough, Eschar, and Subcutaneous after achieving pain control using Lidocaine 4% Topical Solution. A time out was conducted prior to the start of the procedure. A Moderate amount of bleeding was controlled with Silver Nitrate. The procedure was tolerated well with a pain level of 0 throughout and a pain level of 0 following the procedure. Post Debridement Measurements: 3.7cm length x 4.1cm width x 0.4cm depth; 4.766cm^3 volume. Post procedure Diagnosis Wound #1: Same as Pre-Procedure Plan Wound Cleansing: Wound #1 Left Calcaneus: Clean wound with Normal Saline. Anesthetic: Wound #1 Left Calcaneus: Topical Lidocaine 4% cream applied to wound bed prior to debridement Skin Barriers/Peri-Wound Care: Wound #1 Left Calcaneus: Barrier cream [name], JOAQUIN ([id number]) Primary Wound Dressing: Wound #1 Left Calcaneus: Santyl Ointment Secondary Dressing: Wound #1 Left Calcaneus: Dry Gauze Boardered Foam Dressing - heel protector Dressing Change Frequency: Wound #1 Left Calcaneus: Change dressing every day. - PLEASE CHANGE DAILY Follow-up Appointments: Wound #1 Left Calcaneus: Return Appointment in 1  week. Off-Loading: Other: - When lying in bed please float the heels Medications-please add to medication list.: Wound #1 Left Calcaneus: Other: - Please give pt Vitamin C. Zinc, and Multi Vitamins daily. I have recommended: 1. Santyl ointment locally to be changed daily and use some zinc oxide on the macerated skin around this. 2. X-ray of the left foot 3 views result discussed with the family and they would like to defer surgical debridement of the possible subacute osteomyelitis to a later date 3. Appropriate offloading and this has been discussed in great detail. Lia Hopping boat is being used at nighttime 4. Nutrition with additional protein and also discuss vitamin supplements including vitamin C and zinc 5. Regular visiit's to the wound care center. The daughters at the bedside and all her questions have been answered Electronic Signature(s) Signed: 01/16/2015 10:42:10 AM By: Christin Fudge MD, FACS Entered By: Christin Fudge on 01/16/2015 10:42:10 LAIBA, FUERTE ([id number]) -------------------------------------------------------------------------------- SuperBill Details Patient Name: [name] Date of Service: 01/16/2015 Medical Record Number: [id number] Patient Account Number: [id number] Date of Birth/Sex: 14-Sep-1940 (75 y.o. Female) Treating RN: Primary Care Physician: SYSTEM, PCP Other Clinician: Referring Physician: Durenda Guthrie Treating Physician/Extender: Frann Rider in Treatment: 1 Diagnosis Coding ICD-10 Codes Code Description E11.621 Type 2 diabetes mellitus with foot ulcer L89.623 Pressure  ulcer of left heel, stage 3 C83.10 Mantle cell lymphoma, unspecified site Z92.21 Personal history of antineoplastic chemotherapy Facility Procedures CPT4 Code: [id number] Description: 99144 - DEB SUBQ TISSUE 20 SQ CM/< ICD-10 Description Diagnosis E11.621 Type 2 diabetes mellitus with foot ulcer L89.623 Pressure ulcer of left heel, stage 3 C83.10 Mantle cell lymphoma,  unspecified site Z92.21 Personal history of  antineoplastic chemothera Modifier: py Quantity: 1 Physician Procedures CPT4 Code: [id number] Description: 07573 - WC PHYS SUBQ TISS 20 SQ CM ICD-10 Description Diagnosis E11.621 Type 2 diabetes mellitus with foot ulcer L89.623 Pressure ulcer of left heel, stage 3 C83.10 Mantle cell lymphoma, unspecified site Z92.21 Personal history of  antineoplastic chemothera Modifier: py Quantity: 1 Electronic Signature(s) Signed: 01/16/2015 10:42:21 AM By: Christin Fudge MD, FACS Entered By: Christin Fudge on 01/16/2015 10:42:21

## 2015-01-16 NOTE — Telephone Encounter (Signed)
Received a call from [facility] regarding referral for colonoscopy.  They have patient scheduled for colonoscopy but have not not been able to contact patient yet and left a message with her daughter also her granddaughter.  If they will have to change the appt will let us know.

## 2015-01-16 NOTE — Telephone Encounter (Signed)
Pt's daughter, Maia Breslow has been notified of Colonoscopy that has been scheduled at [facility] on Tuesday, Jan 17th. She advised me to contact [place] facility to give colonoscopy instructions. Was able to reach nurse and notified her of appt. Faxed instructions and rx to [phone]. Advised to contact me with any questions.

## 2015-01-16 NOTE — Care Management (Signed)
[facility] called to check on time for pt.to arrive on Tuesday.They are closed on Monday and need to set up transportationfor the patient.I told her I felt it would be good to arrange her to be here between 8:00-8:30.She states she will have the patient set to arrive at 8:00.to be sure she is here on time./V.Tige Meas,R.N.

## 2015-01-17 NOTE — Progress Notes (Signed)
[name], [name] ([id number]) Visit Report for 01/16/2015 Arrival Information Details Patient Name: [name]. Date of Service: 01/16/2015 9:30 AM Medical Record Number: [id number] Patient Account Number: [id number] Date of Birth/Sex: 08/22/1940 (75 y.o. Female) Treating RN: Montey Hora Primary Care Physician: SYSTEM, PCP Other Clinician: Referring Physician: Durenda Guthrie Treating Physician/Extender: Frann Rider in Treatment: 1 Visit Information History Since Last Visit Added or deleted any medications: No Patient Arrived: Wheel Chair Any new allergies or adverse reactions: No Arrival Time: 10:03 Had a fall or experienced change in No activities of daily living that may affect Accompanied By: dtr risk of falls: Transfer Assistance: Manual Signs or symptoms of abuse/neglect since last No Patient Identification Verified: Yes visito Secondary Verification Process Yes Hospitalized since last visit: No Completed: Pain Present Now: No Patient Requires Transmission-Based No Precautions: Patient Has Alerts: Yes Patient Alerts: DM II Electronic Signature(s) Signed: 01/16/2015 3:21:09 PM By: Montey Hora Entered By: Montey Hora on 01/16/2015 10:03:53 [name] ([id number]) -------------------------------------------------------------------------------- Encounter Discharge Information Details Patient Name: [name]. Date of Service: 01/16/2015 9:30 AM Medical Record Number: [id number] Patient Account Number: [id number] Date of Birth/Sex: May 08, 1940 (75 y.o. Female) Treating RN: Primary Care Physician: SYSTEM, PCP Other Clinician: Referring Physician: Durenda Guthrie Treating Physician/Extender: Frann Rider in Treatment: 1 Encounter Discharge Information Items Discharge Pain Level: 0 Discharge Condition: Stable Ambulatory Status: Wheelchair Discharge Destination: Nursing Home Transportation: Private Auto Accompanied By: family Schedule  Follow-up Appointment: Yes Medication Reconciliation completed and provided to Patient/Care No Zaevion Parke: Provided on Clinical Summary of Care: 01/16/2015 Form Type Recipient Paper Patient FD Electronic Signature(s) Signed: 01/16/2015 11:16:01 AM By: Montey Hora Previous Signature: 01/16/2015 10:53:20 AM Version By: Ruthine Dose Entered By: Montey Hora on 01/16/2015 11:16:01 [name] ([id number]) -------------------------------------------------------------------------------- Lower Extremity Assessment Details Patient Name: [name]. Date of Service: 01/16/2015 9:30 AM Medical Record Number: [id number] Patient Account Number: [id number] Date of Birth/Sex: [birth year] (75 y.o. Female) Treating RN: Montey Hora Primary Care Physician: SYSTEM, PCP Other Clinician: Referring Physician: Durenda Guthrie Treating Physician/Extender: Frann Rider in Treatment: 1 Edema Assessment Assessed: [Left: No] [Right: No] Edema: [Left: Ye] [Right: s] Vascular Assessment Pulses: Posterior Tibial Dorsalis Pedis Palpable: [Left:Yes] Extremity colors, hair growth, and conditions: Extremity Color: [Left:Normal] Hair Growth on Extremity: [Left:No] Temperature of Extremity: [Left:Warm] Capillary Refill: [Left:< 3 seconds] Toe Nail Assessment Left: Right: Thick: Yes Discolored: Yes Deformed: No Improper Length and Hygiene: Yes Electronic Signature(s) Signed: 01/16/2015 3:21:09 PM By: Montey Hora Entered By: Montey Hora on 01/16/2015 10:12:16 [name] ([id number]) -------------------------------------------------------------------------------- Multi Wound Chart Details Patient Name: [name]. Date of Service: 01/16/2015 9:30 AM Medical Record Number: [id number] Patient Account Number: [id number] Date of Birth/Sex: 03/22/1940 (75 y.o. 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] Comorbid History: [1:Cataracts, Glaucoma, Sleep Apnea, Hypertension, Myocardial Infarction, Type II Diabetes, History of pressure wounds, Rheumatoid Arthritis, Neuropathy, Received Chemotherapy, Received Radiation] [N/A:N/A] Date Acquired: [1:07/04/2014] [N/A:N/A] Weeks of Treatment: [1:1] [N/A:N/A] Wound Status: [1:Open] [N/A:N/A] Measurements L x W x D 3.7x4.1x0.4 [N/A:N/A] (cm) Area (cm) : [1:11.914] [N/A:N/A] Volume (cm) : [1:4.766] [N/A:N/A] % Reduction in Area: [1:21.60%] [N/A:N/A] % Reduction in Volume: -56.80% [  N/A:N/A] Classification: [1:Full Thickness Without Exposed Support Structures] [N/A:N/A] HBO Classification: [1:Grade 2] [N/A:N/A] Exudate Amount: [1:Large] [N/A:N/A] Exudate Type: [1:Purulent] [N/A:N/A] Exudate Color: [1:yellow, brown, green] [N/A:N/A] Foul Odor After Yes N/A N/A Cleansing: Odor Anticipated Due to No N/A N/A Product Use: Wound Margin: Thickened N/A N/A Granulation Amount: Small (1-33%) N/A N/A Granulation Quality: Red, Pink N/A N/A Necrotic Amount: Large (67-100%) N/A N/A Exposed Structures: Fascia: No N/A N/A Fat: No Tendon: No Muscle: No Joint: No Bone: No Limited to Skin Breakdown Epithelialization: None N/A N/A Periwound Skin Texture: Edema: Yes N/A N/A Periwound Skin Maceration: No N/A N/A Moisture: Moist: No Periwound Skin Color: No Abnormalities Noted N/A N/A Temperature: No Abnormality N/A N/A Tenderness on Yes N/A N/A Palpation: Wound Preparation: Ulcer Cleansing: N/A N/A Rinsed/Irrigated  with Saline Topical Anesthetic Applied: Other: lidocaine 4% Treatment Notes Electronic Signature(s) Signed: 01/16/2015 3:21:09 PM By: Montey Hora Entered By: Montey Hora on 01/16/2015 10:16:36 [name], [name] ([id number]) -------------------------------------------------------------------------------- Multi-Disciplinary Care Plan Details Patient Name: [name], [name]. Date of Service: 01/16/2015 9:30 AM Medical Record Number: [id number] Patient Account Number: [id number] Date of Birth/Sex: 1940/12/05 (75 y.o. Female) Treating RN: Montey Hora Primary Care Physician: SYSTEM, PCP Other Clinician: Referring Physician: Durenda Guthrie Treating Physician/Extender: Frann Rider in Treatment: 1 Active Inactive Abuse / Safety / Falls / Self Care Management Nursing Diagnoses: Knowledge deficit related to: safety; personal, health (wound), emergency Potential for falls Goals: Patient will remain injury free Date Initiated: 01/09/2015 Goal Status: Active Patient/caregiver will verbalize understanding of skin care regimen Date Initiated: 01/09/2015 Goal Status: Active Patient/caregiver will verbalize/demonstrate measures taken to improve the patient's personal safety Date Initiated: 01/09/2015 Goal Status: Active Interventions: Assess fall risk on admission and as needed Assess: immobility, friction, shearing, incontinence upon admission and as needed Notes: Nutrition Nursing Diagnoses: Imbalanced nutrition Goals: Patient/caregiver agrees to and verbalizes understanding of need to use nutritional supplements and/or vitamins as prescribed Date Initiated: 01/09/2015 Goal Status: Active Patient/caregiver will maintain therapeutic glucose control Date Initiated: 01/09/2015 Goal Status: Active [name], [name] ([id number]) Interventions: Assess patient nutrition upon admission and as needed per policy Provide education on elevated blood sugars and impact on wound  healing Notes: Orientation to the Wound Care Program Nursing Diagnoses: Knowledge deficit related to the wound healing center program Goals: Patient/caregiver will verbalize understanding of the [facility] Date Initiated: 01/09/2015 Goal Status: Active Interventions: Provide education on orientation to the wound center Notes: Pain, Acute or Chronic Nursing Diagnoses: Pain Management - Non-cyclic Acute (Procedural) Pain Management - Non-cyclic Chronic Pain Goals: Patient will verbalize adequate pain control and receive pain control interventions during procedures as needed Date Initiated: 01/09/2015 Goal Status: Active Patient/caregiver will verbalize adequate pain control between visits Date Initiated: 01/09/2015 Goal Status: Active Interventions: Assess comfort goal upon admission Complete pain assessment as per visit requirements Notes: Pressure Nursing Diagnoses: Knowledge deficit related to management of pressures ulcers [name], [name] ([id number]) Goals: Patient will remain free from development of additional pressure ulcers Date Initiated: 01/09/2015 Goal Status: Active Interventions: Assess: immobility, friction, shearing, incontinence upon admission and as needed Assess offloading mechanisms upon admission and as needed Notes: Wound/Skin Impairment Nursing Diagnoses: Impaired tissue integrity Goals: Patient/caregiver will verbalize understanding of skin care regimen Date Initiated: 01/09/2015 Goal Status: Active Ulcer/skin breakdown will have a volume reduction of 30% by week 4 Date Initiated: 01/09/2015 Goal Status: Active Ulcer/skin breakdown will have a volume reduction of 50% by week 8 Date Initiated: 01/09/2015 Goal Status: Active Ulcer/skin breakdown will have  a volume reduction of 80% by week 12 Date Initiated: 01/09/2015 Goal Status: Active Interventions: Assess patient/caregiver ability to perform ulcer/skin care regimen upon admission and  as needed Assess ulceration(s) every visit Notes: Electronic Signature(s) Signed: 01/16/2015 3:21:09 PM By: Montey Hora Entered By: Montey Hora on 01/16/2015 10:16:30 [name] ([id number]) -------------------------------------------------------------------------------- Patient/Caregiver Education Details Patient Name: [name] Date of Service: 01/16/2015 9:30 AM Medical Record Number: [id number] Patient Account Number: [id number] Date of Birth/Gender: May 18, 1940 (75 y.o. Female) Treating RN: Montey Hora Primary Care Physician: SYSTEM, PCP Other Clinician: Referring Physician: Durenda Guthrie Treating Physician/Extender: Frann Rider in Treatment: 1 Education Assessment Education Provided To: Patient and Caregiver Education Topics Provided Infection: Handouts: Other: osteomyelitis by Dr Con Memos Methods: Explain/Verbal Responses: State content correctly Wound/Skin Impairment: Handouts: Other: wound care as ordered Methods: Demonstration, Explain/Verbal, Printed Responses: State content correctly Electronic Signature(s) Signed: 01/16/2015 11:16:42 AM By: Montey Hora Entered By: Montey Hora on 01/16/2015 11:16:41 [name], [name] ([id number]) -------------------------------------------------------------------------------- Wound Assessment Details Patient Name: [name]. Date of Service: 01/16/2015 9:30 AM Medical Record Number: [id number] Patient Account Number: [id number] Date of Birth/Sex: [birth year] (75 y.o. Female) Treating RN: Montey Hora Primary Care Physician: SYSTEM, PCP Other Clinician: Referring Physician: Durenda Guthrie Treating Physician/Extender: Frann Rider in Treatment: 1 Wound Status Wound Number: 1 Primary Arterial Insufficiency Ulcer Etiology: Wound Location: Left Calcaneus Wound Open Wounding Event: Gradually Appeared Status: Date Acquired: 07/04/2014 Comorbid Cataracts, Glaucoma, Sleep Apnea, Weeks  Of Treatment: 1 History: Hypertension, Myocardial Infarction, Clustered Wound: No Type II Diabetes, History of pressure wounds, Rheumatoid Arthritis, Neuropathy, Received Chemotherapy, Received Radiation Photos Photo Uploaded By: Montey Hora on 01/16/2015 12:45:32 Wound Measurements Length: (cm) 3.7 Width: (cm) 4.1 Depth: (cm) 0.4 Area: (cm) 11.914 Volume: (cm) 4.766 % Reduction in Area: 21.6% % Reduction in Volume: -56.8% Epithelialization: None Tunneling: No Undermining: No Wound Description Full Thickness Without Foul Odor Aft Classification: Exposed Support Structures Due to Produc Diabetic Severity Grade 2 (Wagner): Wound Margin: Thickened Exudate Amount: Large Exudate Type: Purulent Exudate Color: yellow, brown, green [name], [name] ([id number]) er Cleansing: Yes t Use: No Wound Bed Granulation Amount: Small (1-33%) Exposed Structure Granulation Quality: Red, Pink Fascia Exposed: No Necrotic Amount: Large (67-100%) Fat Layer Exposed: No Necrotic Quality: Adherent Slough Tendon Exposed: No Muscle Exposed: No Joint Exposed: No Bone Exposed: No Limited to Skin Breakdown Periwound Skin Texture Texture Color No Abnormalities Noted: No No Abnormalities Noted: No Localized Edema: Yes Temperature / Pain Moisture Temperature: No Abnormality No Abnormalities Noted: No Tenderness on Palpation: Yes Maceration: No Moist: No Wound Preparation Ulcer Cleansing: Rinsed/Irrigated with Saline Topical Anesthetic Applied: Other: lidocaine 4%, Treatment Notes Wound #1 (Left Calcaneus) 1. Cleansed with: Clean wound with Normal Saline 2. Anesthetic Topical Lidocaine 4% cream to wound bed prior to debridement 4. Dressing Applied: Santyl Ointment 5. Secondary Dressing Applied Gauze and Kerlix/Conform 7. Secured with Tape Notes heel protector Electronic Signature(s) Signed: 01/16/2015 3:21:09 PM By: Montey Hora Entered By: Montey Hora on 01/16/2015  10:16:22 [name], [name] ([id number]) -------------------------------------------------------------------------------- Vitals Details Patient Name: [name] Date of Service: 01/16/2015 9:30 AM Medical Record Number: [id number] Patient Account Number: [id number] Date of Birth/Sex: 06/02/1940 (75 y.o. Female) Treating RN: Montey Hora Primary Care Physician: SYSTEM, PCP Other Clinician: Referring Physician: Durenda Guthrie Treating Physician/Extender: Frann Rider in Treatment: 1 Vital Signs Time Taken: 10:07 Temperature (F): 97.9 Height (in): 65 Pulse (bpm): 64 Weight (lbs): 185 Respiratory Rate (breaths/min): 18 Body Mass Index (BMI): 30.8 Blood Pressure (mmHg): 131/64  Reference Range: 80 - 120 mg / dl Electronic Signature(s) Signed: 01/16/2015 3:21:09 PM By: Montey Hora Entered By: Montey Hora on 01/16/2015 10:09:58

## 2015-01-19 ENCOUNTER — Telehealth: Payer: Self-pay

## 2015-01-19 NOTE — Telephone Encounter (Signed)
  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 Pt is scheduled for colonoscopy 01/20/15

## 2015-01-20 ENCOUNTER — Encounter: Payer: Self-pay | Admitting: Anesthesiology

## 2015-01-20 ENCOUNTER — Emergency Department: Payer: PRIVATE HEALTH INSURANCE

## 2015-01-20 ENCOUNTER — Ambulatory Visit
Admission: RE | Admit: 2015-01-20 | Discharge: 2015-01-20 | Disposition: A | Discharge status: Still In Hospital | Payer: PRIVATE HEALTH INSURANCE | Source: Ambulatory Visit | Attending: Gastroenterology | Admitting: Gastroenterology

## 2015-01-20 ENCOUNTER — Encounter: Payer: Self-pay | Admitting: Emergency Medicine

## 2015-01-20 ENCOUNTER — Encounter
Admission: RE | Disposition: A | Discharge status: Still In Hospital | Payer: Self-pay | Source: Ambulatory Visit | Attending: Gastroenterology

## 2015-01-20 ENCOUNTER — Emergency Department
Admission: EM | Admit: 2015-01-20 | Discharge: 2015-01-20 | Disposition: A | Discharge status: Home / Self Care | Payer: PRIVATE HEALTH INSURANCE | Attending: Emergency Medicine | Admitting: Emergency Medicine

## 2015-01-20 DIAGNOSIS — E119 Type 2 diabetes mellitus without complications: Secondary | ICD-10-CM | POA: Insufficient documentation

## 2015-01-20 DIAGNOSIS — Y9389 Activity, other specified: Secondary | ICD-10-CM | POA: Insufficient documentation

## 2015-01-20 DIAGNOSIS — Z794 Long term (current) use of insulin: Secondary | ICD-10-CM | POA: Insufficient documentation

## 2015-01-20 DIAGNOSIS — W050XXA Fall from non-moving wheelchair, initial encounter: Secondary | ICD-10-CM

## 2015-01-20 DIAGNOSIS — Z87891 Personal history of nicotine dependence: Secondary | ICD-10-CM | POA: Diagnosis not present

## 2015-01-20 DIAGNOSIS — S82402A Unspecified fracture of shaft of left fibula, initial encounter for closed fracture: Secondary | ICD-10-CM

## 2015-01-20 DIAGNOSIS — S8992XA Unspecified injury of left lower leg, initial encounter: Secondary | ICD-10-CM | POA: Diagnosis present

## 2015-01-20 DIAGNOSIS — N189 Chronic kidney disease, unspecified: Secondary | ICD-10-CM | POA: Insufficient documentation

## 2015-01-20 DIAGNOSIS — I129 Hypertensive chronic kidney disease with stage 1 through stage 4 chronic kidney disease, or unspecified chronic kidney disease: Secondary | ICD-10-CM | POA: Insufficient documentation

## 2015-01-20 DIAGNOSIS — E669 Obesity, unspecified: Secondary | ICD-10-CM | POA: Diagnosis not present

## 2015-01-20 DIAGNOSIS — R933 Abnormal findings on diagnostic imaging of other parts of digestive tract: Secondary | ICD-10-CM | POA: Insufficient documentation

## 2015-01-20 DIAGNOSIS — Z880 Allergy status to penicillin: Secondary | ICD-10-CM | POA: Diagnosis not present

## 2015-01-20 DIAGNOSIS — Y92811 Bus as the place of occurrence of the external cause: Secondary | ICD-10-CM | POA: Diagnosis not present

## 2015-01-20 DIAGNOSIS — S82832A Other fracture of upper and lower end of left fibula, initial encounter for closed fracture: Secondary | ICD-10-CM | POA: Diagnosis not present

## 2015-01-20 DIAGNOSIS — L97429 Non-pressure chronic ulcer of left heel and midfoot with unspecified severity: Secondary | ICD-10-CM | POA: Insufficient documentation

## 2015-01-20 DIAGNOSIS — T148XXA Other injury of unspecified body region, initial encounter: Secondary | ICD-10-CM

## 2015-01-20 DIAGNOSIS — Z7982 Long term (current) use of aspirin: Secondary | ICD-10-CM | POA: Diagnosis not present

## 2015-01-20 DIAGNOSIS — F0390 Unspecified dementia without behavioral disturbance: Secondary | ICD-10-CM | POA: Insufficient documentation

## 2015-01-20 DIAGNOSIS — Y998 Other external cause status: Secondary | ICD-10-CM | POA: Insufficient documentation

## 2015-01-20 DIAGNOSIS — Z79899 Other long term (current) drug therapy: Secondary | ICD-10-CM | POA: Diagnosis not present

## 2015-01-20 DIAGNOSIS — Z539 Procedure and treatment not carried out, unspecified reason: Secondary | ICD-10-CM | POA: Insufficient documentation

## 2015-01-20 LAB — CBC
HEMATOCRIT: 29 % — AB (ref 35.0–47.0)
HEMOGLOBIN: 9.5 g/dL — AB (ref 12.0–16.0)
MCH: 27.2 pg (ref 26.0–34.0)
MCHC: 32.8 g/dL (ref 32.0–36.0)
MCV: 83 fL (ref 80.0–100.0)
Platelets: 175 10*3/uL (ref 150–440)
RBC: 3.49 MIL/uL — AB (ref 3.80–5.20)
RDW: 15.8 % — ABNORMAL HIGH (ref 11.5–14.5)
WBC: 10.5 10*3/uL (ref 3.6–11.0)

## 2015-01-20 LAB — BASIC METABOLIC PANEL
Anion gap: 11 (ref 5–15)
BUN: 15 mg/dL (ref 6–20)
CHLORIDE: 99 mmol/L — AB (ref 101–111)
CO2: 25 mmol/L (ref 22–32)
CREATININE: 1.05 mg/dL — AB (ref 0.44–1.00)
Calcium: 9.5 mg/dL (ref 8.9–10.3)
GFR calc Af Amer: 60 mL/min — ABNORMAL LOW (ref >60)
GFR calc non Af Amer: 51 mL/min — ABNORMAL LOW (ref >60)
GLUCOSE: 87 mg/dL (ref 65–99)
POTASSIUM: 4.5 mmol/L (ref 3.5–5.1)
SODIUM: 135 mmol/L (ref 135–145)

## 2015-01-20 LAB — SEDIMENTATION RATE

## 2015-01-20 LAB — GLUCOSE, CAPILLARY: Glucose-Capillary: 75 mg/dL (ref 65–99)

## 2015-01-20 LAB — TROPONIN I: Troponin I: <0.03 ng/mL (ref <0.031)

## 2015-01-20 SURGERY — COLONOSCOPY WITH PROPOFOL
Anesthesia: General

## 2015-01-20 MED ORDER — ACETAMINOPHEN 500 MG PO TABS
1000.0000 mg | ORAL_TABLET | Freq: Once | ORAL | Status: AC
  Administered 2015-01-20: 1000 mg via ORAL
  Filled 2015-01-20: qty 2

## 2015-01-20 NOTE — ED Notes (Signed)
pemt at bedside to apply splint

## 2015-01-20 NOTE — ED Notes (Signed)
Patient here for colonoscopy this AM when she fell from her wheelchair on transport bus.  Complaining of left leg pain.  Left leg hurts per patient, states she fell onto her buttocks on the floor of the van.

## 2015-01-20 NOTE — ED Provider Notes (Signed)
[facility] Emergency Department Provider Note  ____________________________________________  Time seen: Approximately 12:09 PM  I have reviewed the triage vital signs and the nursing notes.   HISTORY  Chief Complaint Leg Pain  The patient history and physical is limited due to her dementia.  HPI [name] is a 75 y.o. female with dementia from nursing home, chronic nonhealing left heel wound, CAD status post MI, HTN, DM presenting with fall.The patient is unable to give me a descriptive history of her fall. She states, "I fell from a wheelchair." She states that she hurts "everywhere." She does not have any acute complaints at this time other than pain in her left heel. Per EMS report, the patient was on the way to a colonoscopy when she fell out of her wheelchair. Unfortunately, I do not have any more information about whether she had a syncopal episode, what her blood sugar was or what her vital signs were. The nurse Nayar attempting to obtain more information at this time.   Past Medical History  Diagnosis Date  . Diabetes mellitus without complication ([place])   . Arthritis   . Non Hodgkin's lymphoma ([place]) 2009    s/p stem cell transplant  . Hypertension   . MI (myocardial infarction) ([place])     1990s  . TIA (transient ischemic attack)   . Spinal stenosis   . HLD (hyperlipidemia)   . GERD (gastroesophageal reflux disease)     Patient Active Problem List   Diagnosis Date Noted  . Hyponatremia 11/17/2014  . Pressure ulcer 11/14/2014  . Acute kidney injury superimposed on chronic kidney disease ([place]) 11/01/2014  . Acute encephalopathy 10/31/2014  . Aspiration pneumonia ([place]) 10/31/2014  . Hypoglycemia 10/31/2014  . HTN (hypertension) 10/31/2014  . Type 2 diabetes mellitus ([place]) 10/31/2014  . CAD (coronary artery disease) 10/31/2014  . DDD (degenerative disc disease), lumbar 06/24/2014  . Facet syndrome, lumbar 06/24/2014  . Spinal stenosis,  lumbar region, with neurogenic claudication 06/24/2014  . Neuropathy due to secondary diabetes ([place]) 06/24/2014    Past Surgical History  Procedure Laterality Date  . Abdominal hysterectomy    . Toe amputation Left   . Eye surgery Bilateral     Current Outpatient Rx  Name  Route  Sig  Dispense  Refill  . acetaminophen (TYLENOL) 500 MG tablet   Oral   Take 500 mg by mouth every 8 (eight) hours as needed.         Marland Kitchen amLODipine (NORVASC) 5 MG tablet   Oral   Take 5 mg by mouth daily.         Marland Kitchen aspirin EC 81 MG tablet   Oral   Take 81 mg by mouth daily.         Marland Kitchen atorvastatin (LIPITOR) 80 MG tablet   Oral   Take 80 mg by mouth daily.         . brimonidine-timolol (COMBIGAN) 0.2-0.5 % ophthalmic solution   Both Eyes   Place 1 drop into both eyes every 12 (twelve) hours.         . Carboxymethylcellul-Glycerin (OPTIVE) 0.5-0.9 % SOLN   Ophthalmic   Apply 1-2 drops to eye 3 (three) times daily as needed.         . chlorthalidone (HYGROTON) 25 MG tablet   Oral   Take 25 mg by mouth daily.         . citalopram (CELEXA) 20 MG tablet   Oral   Take 20 mg  by mouth daily.         Marland Kitchen docusate sodium (COLACE) 100 MG capsule   Oral   Take 100 mg by mouth at bedtime as needed for mild constipation.         . furosemide (LASIX) 40 MG tablet   Oral   Take 1 tablet (40 mg total) by mouth daily.   30 tablet   0   . gabapentin (NEURONTIN) 300 MG capsule      TAKE ONE CAPSULE BY MOUTH EVERY 12 HOURS FOR PAIN      0   . insulin aspart (NOVOLOG) 100 UNIT/ML injection   Subcutaneous   Inject 0-9 Units into the skin 3 (three) times daily with meals.   10 mL   11   . lisinopril (PRINIVIL,ZESTRIL) 40 MG tablet   Oral   Take 40 mg by mouth daily.         Marland Kitchen omeprazole (PRILOSEC) 20 MG capsule   Oral   Take 20 mg by mouth daily.         . polyethylene glycol (MIRALAX / GLYCOLAX) packet   Oral   Take 17 g by mouth daily as needed.         .  polyethylene glycol-electrolytes (TRILYTE) 420 g solution   Oral   Take 4,000 mLs by mouth as directed. Drink one 8 oz glass every 30 mins until stools are clear.   4000 mL   0     If you do not stock Trilyte, you may substitute wh ...   . sodium chloride 1 G tablet   Oral   Take 1 tablet (1 g total) by mouth 2 (two) times daily with a meal.   20 tablet   0   . Travoprost, BAK Free, (TRAVATAN) 0.004 % SOLN ophthalmic solution   Both Eyes   Place 1 drop into both eyes.         Marland Kitchen trolamine salicylate (ASPERCREME) 10 % cream   Topical   Apply 1 application topically as needed for muscle pain (left hip).         . Vitamin D, Ergocalciferol, (DRISDOL) 50000 UNITS CAPS capsule   Oral   Take 50,000 Units by mouth every 30 (thirty) days.         . Zinc Oxide (DESITIN) 13 % CREA   Apply externally   Apply topically.           Allergies Penicillins  Family History  Problem Relation Age of Onset  . Hypertension Mother   . Heart disease Mother   . Diabetes Father   . Breast cancer Sister     Social History Social History  Substance Use Topics  . Smoking status: Former Research scientist (life sciences)  . Smokeless tobacco: None  . Alcohol Use: No    Review of Systems Unable to obtain due to patient dementia.  ____________________________________________   PHYSICAL EXAM:  VITAL SIGNS: ED Triage Vitals  Enc Vitals Group     BP 01/20/15 0942 119/91 mmHg     Pulse Rate 01/20/15 0942 93     Resp --      Temp 01/20/15 0942 98.5 F (36.9 C)     Temp Source 01/20/15 0942 Oral     SpO2 01/20/15 0942 96 %     Weight 01/20/15 0942 212 lb (96.163 kg)     Height 01/20/15 0942 5\' 4"  (1.626 m)     Head Cir --      Peak Flow --  Pain Score --      Pain Loc --      Pain Edu? --      Excl. in [place]? --     Constitutional: Patient is alert and oriented to person only. She is able to follow basic commands.  Eyes: Conjunctivae are normal.  EOMI. no scleral icterus. No discharge. Head:  Atraumatic. Nose: No congestion/rhinnorhea. Mouth/Throat: Mucous membranes are moist.  Neck: No stridor.  Supple.   Cardiovascular: Normal rate, regular rhythm. No murmurs, rubs or gallops.  Respiratory: Normal respiratory effort.  No retractions. Lungs CTAB.  No wheezes, rales or ronchi. Gastrointestinal: Obese. Soft and nontender. No distention. No peritoneal signs. Musculoskeletal: Patient has a 4 x 4 centimeter chronic nonhealing wound on the posterior left heel with mild malodor but no significant purulence, surrounding erythema or swelling. In the calf area, the patient has a remote burn which is well healed. Patient has no effusion around the left knee and has full range of motion but cannot determine pain as this pain may become from her nonhealing wound. Full range of motion of the left hip. Cap refill in the distal great toe is 2 seconds and the patient has surgical removal of the 4 other toes. Neurologic:  Alert and oriented 1. Clear speech. Moves all extremities well.  Skin:  Skin is warm, dry and intact. No rash noted. Psychiatric: Mood and affect are normal. Speech and behavior are normal.  Normal judgement.  ____________________________________________   LABS (all labs ordered are listed, but only abnormal results are displayed)  Labs Reviewed  CBC - Abnormal; Notable for the following:    RBC 3.49 (*)    Hemoglobin 9.5 (*)    HCT 29.0 (*)    RDW 15.8 (*)    All other components within normal limits  BASIC METABOLIC PANEL - Abnormal; Notable for the following:    Chloride 99 (*)    Creatinine, Ser 1.05 (*)    GFR calc non Af Amer 51 (*)    GFR calc Af Amer 60 (*)    All other components within normal limits  SEDIMENTATION RATE - Abnormal; Notable for the following:    Sed Rate >140 (*)    All other components within normal limits  TROPONIN I  GLUCOSE, CAPILLARY   ____________________________________________  EKG  ED ECG REPORT I, Eula Listen, the  attending physician, personally viewed and interpreted this ECG.   Date: 01/20/2015  EKG Time: 1321  Rate: 110  Rhythm: sinus tachycardia  Axis: leftward  Intervals:none  ST&T Change: + PVC, no ST elevation  ____________________________________________  RADIOLOGY  Dg Ankle Complete Left  01/20/2015  CLINICAL DATA:  Open wound to left heel. Patient fell trying to get out of wheelchair today. EXAM: LEFT ANKLE COMPLETE - 3+ VIEW COMPARISON:  Left calcaneus radiographs, 01/09/2015 FINDINGS: There is an oblique nondisplaced fracture of the distal fibula, across the metaphysis at the level of the ankle joint. There is no fracture comminution. Ankle mortise is normally spaced and aligned. Along posterior margin of the calcaneus, there is an overlying large soft tissue ulcer. Loss of the white cortical line with bordering periosteal reaction is noted without change from the prior exam, likely chronic osteomyelitis. Screw extends from the talus into the calcaneus, well-seated and unchanged. There is diffuse soft tissue swelling. IMPRESSION: 1. Nondisplaced oblique fracture of the distal fibula. No fracture comminution. Normal alignment of the ankle mortise. 2. No change in the appearance of the calcaneal tuberosity. 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  Electronically Signed   By: Lajean Manes M.D.   On: 01/20/2015 12:50   Ct Head Wo Contrast  01/20/2015  CLINICAL DATA:  Patient here for colonoscopy this AM when she fell from her wheelchair on transport bus. Complaining of left leg pain. Left leg hurts per patient, states she fell onto her buttocks on the floor of the van. Denies LOC or hitting head. EXAM: CT HEAD WITHOUT CONTRAST TECHNIQUE: Contiguous axial images were obtained from the base of the skull through the vertex without intravenous contrast. COMPARISON:  10/31/2014. FINDINGS: Ventricles are normal in configuration. There is ventricular and sulcal enlargement reflecting mild to moderate atrophy. No  hydrocephalus. There are no parenchymal masses or mass effect. There is no evidence of a cortical infarct. Patchy periventricular white matter hypo attenuation is consistent with mild chronic microvascular ischemic change. There are no extra-axial masses or abnormal fluid collections. There is no intracranial hemorrhage. Visualized sinuses and mastoid air cells are clear. No skull fracture. IMPRESSION: No acute intracranial abnormalities. Mild to moderate atrophy and mild chronic microvascular ischemic change. Electronically Signed   By: Lajean Manes M.D.   On: 01/20/2015 13:08   Dg Knee Complete 4 Views Left  01/20/2015  CLINICAL DATA:  Fall this morning, left leg pain EXAM: LEFT KNEE - COMPLETE 4+ VIEW COMPARISON:  None. FINDINGS: Five views of the left knee submitted. No acute fracture or subluxation. There is diffuse narrowing of joint space. There is spurring of medial femoral condyle and medial tibial plateau. Minimal spurring of lateral femoral condyle. Significant narrowing of patellofemoral joint space. Spurring of patella. No joint effusion. Diffuse osteopenia. IMPRESSION: No definite acute fracture or subluxation. Osteoarthritic changes as described above. Electronically Signed   By: Lahoma Crocker M.D.   On: 01/20/2015 11:14    ____________________________________________   PROCEDURES  Procedure(s) performed: None  Critical Care performed: No ____________________________________________   INITIAL IMPRESSION / ASSESSMENT AND PLAN / ED COURSE  Pertinent labs & imaging results that were available during my care of the patient were reviewed by me and considered in my medical decision making (see chart for details).  75 y.o. female with multiple comorbidities presenting with a fall from a wheelchair and "pain all over." Given that she was on her way to a colonoscopy, I am concerned that she may have had presyncope or syncope from hypovolemia and we'll evaluate her for that. We will also  check for arrhythmia, ACS or MI, acute stroke although she has no focal findings on her exam for this. I will also check her for osteo-or infection given her nonhealing wound, although I suspect that this is a chronic condition for her.  ----------------------------------------- 3:09 PM on 01/20/2015 -----------------------------------------  Patient's daughter has arrived and we have more information about what occurred during the fall. It appears that the patient "slid" down her wheelchair onto the ground. No change in her mental status or with wound the wound appearance. The patient's labs show an elevated sedimentation rate which is to be expected. Her knee x-ray is negative for any fracture or dislocation. Her left ankle shows a nondisplaced fibular fracture. I will talk to orthopedics but anticipate that we will splint it and have her follow-up. She is non-ambulatory at baseline. Anticipate discharge home.  Repeat HR 90's.  ----------------------------------------- 3:22 PM on 01/20/2015 -----------------------------------------  I discussed the patient's fracture with Dr. Mack Guise, who recommends posterior splint with a significant amount of padding to protect the heel ulcer. The patient understands that her splint is  removable for normal wound care but that she should wear it at all other times. I have also discussed this with the patient's daughter and printed 3 copies of the discharge paperwork with those instructions for the patient's nursing home, by my care physician, and wound care management team. ____________________________________________  FINAL CLINICAL IMPRESSION(S) / ED DIAGNOSES  Final diagnoses:  Closed fibular fracture, left, initial encounter  Fall from wheelchair, initial encounter  Nonhealing nonsurgical wound      NEW MEDICATIONS STARTED DURING THIS VISIT:  New Prescriptions   No medications on file     Eula Listen, MD 01/20/15 1524

## 2015-01-20 NOTE — Discharge Instructions (Signed)
Cast or Splint Care °Casts and splints support injured limbs and keep bones from moving while they heal. It is important to care for your cast or splint at home.   °HOME CARE INSTRUCTIONS °· Keep the cast or splint uncovered during the drying period. It can take 24 to 48 hours to dry if it is made of plaster. A fiberglass cast will dry in less than 1 hour. °· Do not rest the cast on anything harder than a pillow for the first 24 hours. °· Do not put weight on your injured limb or apply pressure to the cast until your health care provider gives you permission. °· Keep the cast or splint dry. Wet casts or splints can lose their shape and may not support the limb as well. A wet cast that has lost its shape can also create harmful pressure on your skin when it dries. Also, wet skin can become infected. °· Cover the cast or splint with a plastic bag when bathing or when out in the rain or snow. If the cast is on the trunk of the body, take sponge baths until the cast is removed. °· If your cast does become wet, dry it with a towel or a blow dryer on the cool setting only. °· Keep your cast or splint clean. Soiled casts may be wiped with a moistened cloth. °· Do not place any hard or soft foreign objects under your cast or splint, such as cotton, toilet paper, lotion, or powder. °· Do not try to scratch the skin under the cast with any object. The object could get stuck inside the cast. Also, scratching could lead to an infection. If itching is a problem, use a blow dryer on a cool setting to relieve discomfort. °· Do not trim or cut your cast or remove padding from inside of it. °· Exercise all joints next to the injury that are not immobilized by the cast or splint. For example, if you have a long leg cast, exercise the hip joint and toes. If you have an arm cast or splint, exercise the shoulder, elbow, thumb, and fingers. °· Elevate your injured arm or leg on 1 or 2 pillows for the first 1 to 3 days to decrease  swelling and pain. It is best if you can comfortably elevate your cast so it is higher than your heart. °SEEK MEDICAL CARE IF:  °· Your cast or splint cracks. °· Your cast or splint is too tight or too loose. °· You have unbearable itching inside the cast. °· Your cast becomes wet or develops a soft spot or area. °· You have a bad smell coming from inside your cast. °· You get an object stuck under your cast. °· Your skin around the cast becomes red or raw. °· You have new pain or worsening pain after the cast has been applied. °SEEK IMMEDIATE MEDICAL CARE IF:  °· You have fluid leaking through the cast. °· You are unable to move your fingers or toes. °· You have discolored (blue or white), cool, painful, or very swollen fingers or toes beyond the cast. °· You have tingling or numbness around the injured area. °· You have severe pain or pressure under the cast. °· You have any difficulty with your breathing or have shortness of breath. °· You have chest pain. °  °This information is not intended to replace advice given to you by your health care provider. Make sure you discuss any questions you have with your health care   provider.   Document Released: 12/18/1999 Document Revised: 10/10/2012 Document Reviewed: 06/28/2012 Elsevier Interactive Patient Education 2016 [employer]  Fibular Ankle Fracture Treated With or Without Immobilization, Adult A fibular fracture at your ankle is a break (fracture) bone in the smallest of the two bones in your lower leg, located on the outside of your leg (fibula) close to the area at your ankle joint. CAUSES  Rolling your ankle.  Twisting your ankle.  Extreme flexing or extending of your foot.  Severe force on your ankle as when falling from a distance. RISK FACTORS  Jumping activities.  Participation in sports.  Osteoporosis.  Advanced age.  Previous ankle injuries. SIGNS AND SYMPTOMS  Pain.  Swelling.  Inability to put weight on injured  ankle.  Bruising.  Bone deformities at site of injury. DIAGNOSIS  This fracture is diagnosed with the help of an X-ray exam. TREATMENT  If the fractured bone did not move out of place it usually will heal without problems and does casting or splinting. If immobilization is needed for comfort or the fractured bone moved out of place and will not heal properly with immobilization, a cast or splint will be used. HOME CARE INSTRUCTIONS   Apply ice to the area of injury:  Put ice in a plastic bag.  Place a towel between your skin and the bag.  Leave the ice on for 20 minutes, 2-3 times a day.  Use crutches as directed. Resume walking without crutches as directed by your health care provider.  Only take over-the-counter or prescription medicines for pain, discomfort, or fever as directed by your health care provider.  If you have a removable splint or boot, do not remove the boot unless directed by your health care provider. SEEK MEDICAL CARE IF:   You have continued pain or more swelling  The medications do not control the pain. SEEK IMMEDIATE MEDICAL CARE IF:  You develop severe pain in the leg or foot.  Your skin or nails below the injury turn blue or grey or feel cold or numb. MAKE SURE YOU:   Understand these instructions.  Will watch your condition.  Will get help right away if you are not doing well or get worse.   This information is not intended to replace advice given to you by your health care provider. Make sure you discuss any questions you have with your health care provider.   Document Released: 12/20/2004 Document Revised: 01/10/2014 Document Reviewed: 08/01/2012 Elsevier Interactive Patient Education 2016 [employer].   Please keep the splint on at all times, except when performing wound care for the ulcer on the back of the left ankle. Please continue to perform wound care as needed. Please make a follow-up appointment with the orthopedic doctor for the  fracture and continue to see the wound care management services for your heel ulcer.  Please return to the emergency department if you develop severe pain, pus drainage, fever, nausea or vomiting, changes in mental status, or any other symptoms concerning to you.

## 2015-01-20 NOTE — ED Notes (Signed)
Obtained blood work. Unable to obtain IV access with 2 attempts.

## 2015-01-20 NOTE — ED Notes (Signed)
Pt states she cant sign the esig board. 3 copies of dc instructions were sent for caregivers

## 2015-01-21 DIAGNOSIS — S82409A Unspecified fracture of shaft of unspecified fibula, initial encounter for closed fracture: Secondary | ICD-10-CM | POA: Insufficient documentation

## 2015-01-29 DIAGNOSIS — M869 Osteomyelitis, unspecified: Secondary | ICD-10-CM | POA: Insufficient documentation

## 2015-02-06 ENCOUNTER — Ambulatory Visit: Payer: PRIVATE HEALTH INSURANCE | Admitting: Surgery

## 2015-02-13 ENCOUNTER — Encounter: Payer: Medicare (Managed Care) | Attending: Surgery | Admitting: Surgery

## 2015-02-13 DIAGNOSIS — I2510 Atherosclerotic heart disease of native coronary artery without angina pectoris: Secondary | ICD-10-CM | POA: Diagnosis not present

## 2015-02-13 DIAGNOSIS — I10 Essential (primary) hypertension: Secondary | ICD-10-CM | POA: Insufficient documentation

## 2015-02-13 DIAGNOSIS — C8310 Mantle cell lymphoma, unspecified site: Secondary | ICD-10-CM | POA: Insufficient documentation

## 2015-02-13 DIAGNOSIS — Z9221 Personal history of antineoplastic chemotherapy: Secondary | ICD-10-CM | POA: Diagnosis not present

## 2015-02-13 DIAGNOSIS — E785 Hyperlipidemia, unspecified: Secondary | ICD-10-CM | POA: Insufficient documentation

## 2015-02-13 DIAGNOSIS — L89623 Pressure ulcer of left heel, stage 3: Secondary | ICD-10-CM | POA: Diagnosis not present

## 2015-02-13 DIAGNOSIS — E11621 Type 2 diabetes mellitus with foot ulcer: Secondary | ICD-10-CM | POA: Diagnosis present

## 2015-02-14 NOTE — Progress Notes (Signed)
GLENDELL, GREB ([id number]) Visit Report for 02/13/2015 Arrival Information Details Patient Name: [name], [name]. Date of Service: 02/13/2015 10:45 AM Medical Record Number: [id number] Patient Account Number: [id number] Date of Birth/Sex: 1940-05-27 (75 y.o. Female) Treating RN: Carolyne Fiscal, Debi Primary Care Physician: SYSTEM, PCP Other Clinician: Referring Physician: Durenda Guthrie Treating Physician/Extender: Frann Rider in Treatment: 5 Visit Information History Since Last Visit All ordered tests and consults were completed: No Patient Arrived: Wheel Chair Added or deleted any medications: No Arrival Time: 11:10 Any new allergies or adverse reactions: No Accompanied By: daughter Had a fall or experienced change in No activities of daily living that may affect Transfer Assistance: None risk of falls: Patient Identification Verified: Yes Signs or symptoms of abuse/neglect since last No Secondary Verification Process Yes visito Completed: Hospitalized since last visit: No Patient Requires Transmission-Based No Pain Present Now: No Precautions: Patient Has Alerts: Yes Patient Alerts: DM II Electronic Signature(s) Signed: 02/13/2015 5:45:24 PM By: Alric Quan Entered By: Alric Quan on 02/13/2015 11:11:22 [name] ([id number]) -------------------------------------------------------------------------------- Encounter Discharge Information Details Patient Name: [name]. Date of Service: 02/13/2015 10:45 AM Medical Record Number: [id number] Patient Account Number: [id number] Date of Birth/Sex: 06/05/1940 (75 y.o. Female) Treating RN: Carolyne Fiscal, Debi Primary Care Physician: SYSTEM, PCP Other Clinician: Referring Physician: Durenda Guthrie Treating Physician/Extender: Frann Rider in Treatment: 5 Encounter Discharge Information Items Discharge Pain Level: 0 Discharge Condition: Stable Ambulatory Status: Wheelchair Discharge Destination:  Nursing Home Transportation: Other Accompanied By: daughter Schedule Follow-up Appointment: Yes Medication Reconciliation completed and provided to Patient/Care Yes Marvelous Bouwens: Provided on Clinical Summary of Care: 02/13/2015 Form Type Recipient Paper Patient FD Electronic Signature(s) Signed: 02/13/2015 11:56:39 AM By: Ruthine Dose Entered By: Ruthine Dose on 02/13/2015 11:56:39 Calvario, Eppie Gibson ([id number]) -------------------------------------------------------------------------------- Lower Extremity Assessment Details Patient Name: [name]. Date of Service: 02/13/2015 10:45 AM Medical Record Number: [id number] Patient Account Number: [id number] Date of Birth/Sex: 01-Feb-1940 (75 y.o. Female) Treating RN: Carolyne Fiscal, Debi Primary Care Physician: SYSTEM, PCP Other Clinician: Referring Physician: Durenda Guthrie Treating Physician/Extender: Frann Rider in Treatment: 5 Vascular Assessment Pulses: Posterior Tibial Dorsalis Pedis Palpable: [Left:Yes] Extremity colors, hair growth, and conditions: Extremity Color: [Left:Normal] Hair Growth on Extremity: [Left:No] Temperature of Extremity: [Left:Warm] Capillary Refill: [Left:< 3 seconds] Toe Nail Assessment Left: Right: Thick: Yes Discolored: Yes Deformed: No Improper Length and Hygiene: Yes Electronic Signature(s) Signed: 02/13/2015 5:45:24 PM By: Alric Quan Entered By: Alric Quan on 02/13/2015 11:16:05 Meggett, Eppie Gibson ([id number]) -------------------------------------------------------------------------------- Multi Wound Chart Details Patient Name: [name]. Date of Service: 02/13/2015 10:45 AM Medical Record Number: [id number] Patient Account Number: [id number] Date of Birth/Sex: 10-08-1940 (75 y.o. 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] Comorbid History: [1:Cataracts, Glaucoma, Sleep Apnea, Hypertension, Myocardial Infarction, Type II Diabetes, History of pressure wounds, Rheumatoid Arthritis, Neuropathy, Received Chemotherapy, Received Radiation] [N/A:N/A] Date Acquired: [1:07/04/2014] [N/A:N/A] Weeks of Treatment: [1:5] [N/A:N/A] Wound Status: [1:Open] [N/A:N/A] Measurements L x W x D 4x2x0.3 [N/A:N/A] (cm) Area (cm) : [1:6.283] [N/A:N/A] Volume (cm) : [1:1.885] [N/A:N/A] % Reduction in Area: [1:58.70%] [N/A:N/A] % Reduction in Volume: 38.00% [N/A:N/A] Classification: [1:Full Thickness Without Exposed Support Structures] [N/A:N/A] HBO Classification: [1:Grade  2] [N/A:N/A] Exudate Amount: [1:Large] [N/A:N/A] Exudate Type: [1:Purulent] [N/A:N/A] Exudate Color: [1:yellow, brown, green] [N/A:N/A] Foul Odor After Yes N/A N/A Cleansing: Odor Anticipated Due to No N/A N/A Product Use: Wound Margin: Thickened N/A N/A Granulation Amount: Large (67-100%) N/A N/A Granulation Quality: Red, Pink N/A N/A Necrotic Amount: Small (1-33%) N/A N/A Exposed Structures: Fascia: No N/A N/A Fat: No Tendon: No Muscle: No Joint: No Bone: No Limited to Skin Breakdown Epithelialization: None N/A N/A Periwound Skin Texture: Edema: Yes N/A N/A Periwound Skin Maceration: Yes N/A N/A Moisture: Moist: Yes Periwound Skin Color: No Abnormalities Noted N/A N/A Temperature: No Abnormality N/A N/A Tenderness on Yes N/A N/A Palpation: Wound Preparation: Ulcer Cleansing: N/A N/A Rinsed/Irrigated with Saline Topical Anesthetic Applied: Other:  lidocaine 4% Treatment Notes Electronic Signature(s) Signed: 02/13/2015 5:45:24 PM By: Alric Quan Entered By: Alric Quan on 02/13/2015 11:23:53 MAKAYAH, EIDEM ([id number]) -------------------------------------------------------------------------------- [place] Details Patient Name: [name]. Date of Service: 02/13/2015 10:45 AM Medical Record Number: [id number] Patient Account Number: [id number] Date of Birth/Sex: [birth year] (75 y.o. Female) Treating RN: Carolyne Fiscal, Debi Primary Care Physician: SYSTEM, PCP Other Clinician: Referring Physician: Durenda Guthrie Treating Physician/Extender: Frann Rider in Treatment: 5 Active Inactive Abuse / Safety / Falls / Self Care Management Nursing Diagnoses: Knowledge deficit related to: safety; personal, health (wound), emergency Potential for falls Goals: Patient will remain injury free Date Initiated: 01/09/2015 Goal Status: Active Patient/caregiver will verbalize understanding of skin care regimen Date Initiated: 01/09/2015 Goal Status: Active Patient/caregiver will verbalize/demonstrate measures taken to improve the patient's personal safety Date Initiated: 01/09/2015 Goal Status: Active Interventions: Assess fall risk on admission and as needed Assess: immobility, friction, shearing, incontinence upon admission and as needed Notes: Nutrition Nursing Diagnoses: Imbalanced nutrition Goals: Patient/caregiver agrees to and verbalizes understanding of need to use nutritional supplements and/or vitamins as prescribed Date Initiated: 01/09/2015 Goal Status: Active Patient/caregiver will maintain therapeutic glucose control Date Initiated: 01/09/2015 Goal Status: Active CAMARY, HAMADE ([id number]) Interventions: Assess patient nutrition upon admission and as needed per policy Provide education on elevated blood sugars and impact on wound healing Notes: Orientation to the Wound Care  Program Nursing Diagnoses: Knowledge deficit related to the wound healing center program Goals: Patient/caregiver will verbalize understanding of the [place] Date Initiated: 01/09/2015 Goal Status: Active Interventions: Provide education on orientation to the wound center Notes: Pain, Acute or Chronic Nursing Diagnoses: Pain Management - Non-cyclic Acute (Procedural) Pain Management - Non-cyclic Chronic Pain Goals: Patient will verbalize adequate pain control and receive pain control interventions during procedures as needed Date Initiated: 01/09/2015 Goal Status: Active Patient/caregiver will verbalize adequate pain control between visits Date Initiated: 01/09/2015 Goal Status: Active Interventions: Assess comfort goal upon admission Complete pain assessment as per visit requirements Notes: Pressure Nursing Diagnoses: Knowledge deficit related to management of pressures ulcers JENALIS, DUTCH ([id number]) Goals: Patient will remain free from development of additional pressure ulcers Date Initiated: 01/09/2015 Goal Status: Active Interventions: Assess: immobility, friction, shearing, incontinence upon admission and as needed Assess offloading mechanisms upon admission and as needed Notes: Wound/Skin Impairment Nursing Diagnoses: Impaired tissue integrity Goals: Patient/caregiver will verbalize understanding of skin care regimen Date Initiated: 01/09/2015 Goal Status: Active Ulcer/skin breakdown will have a volume reduction of 30% by week 4 Date Initiated: 01/09/2015 Goal Status: Active Ulcer/skin breakdown will have a volume reduction of 50% by week 8 Date Initiated: 01/09/2015 Goal Status: Active Ulcer/skin breakdown will have a volume reduction of 80% by week 12 Date Initiated: 01/09/2015 Goal  Status: Active Interventions: Assess patient/caregiver ability to perform ulcer/skin care regimen upon admission and as needed Assess ulceration(s) every  visit Notes: Electronic Signature(s) Signed: 02/13/2015 5:45:24 PM By: Alric Quan Entered By: Alric Quan on 02/13/2015 11:23:45 Bealer, Eppie Gibson ([id number]) -------------------------------------------------------------------------------- Pain Assessment Details Patient Name: [name]. Date of Service: 02/13/2015 10:45 AM Medical Record Number: [id number] Patient Account Number: [id number] Date of Birth/Sex: [birth year] (75 y.o. Female) Treating RN: Carolyne Fiscal, Debi Primary Care Physician: SYSTEM, PCP Other Clinician: Referring Physician: Durenda Guthrie Treating Physician/Extender: Frann Rider in Treatment: 5 Active Problems Location of Pain Severity and Description of Pain Patient Has Paino No Site Locations Pain Management and Medication Current Pain Management: Electronic Signature(s) Signed: 02/13/2015 5:45:24 PM By: Alric Quan Entered By: Alric Quan on 02/13/2015 11:11:31 [name] ([id number]) -------------------------------------------------------------------------------- Patient/Caregiver Education Details Patient Name: [name] Date of Service: 02/13/2015 10:45 AM Medical Record Number: [id number] Patient Account Number: [id number] Date of Birth/Gender: 1940-03-12 (75 y.o. Female) Treating RN: Carolyne Fiscal, Debi Primary Care Physician: SYSTEM, PCP Other Clinician: Referring Physician: Durenda Guthrie Treating Physician/Extender: Frann Rider in Treatment: 5 Education Assessment Education Provided To: Patient and Caregiver Education Topics Provided Wound/Skin Impairment: Handouts: Other: change dressing as ordered Methods: Demonstration, Explain/Verbal Responses: State content correctly Electronic Signature(s) Signed: 02/13/2015 5:45:24 PM By: Alric Quan Entered By: Alric Quan on 02/13/2015 11:40:42 Benfer, Eppie Gibson  ([id number]) -------------------------------------------------------------------------------- Wound Assessment Details Patient Name: [name]. Date of Service: 02/13/2015 10:45 AM Medical Record Number: [id number] Patient Account Number: [id number] Date of Birth/Sex: March 29, 1940 (75 y.o. Female) Treating RN: Carolyne Fiscal, Debi Primary Care Physician: SYSTEM, PCP Other Clinician: Referring Physician: Durenda Guthrie Treating Physician/Extender: Frann Rider in Treatment: 5 Wound Status Wound Number: 1 Primary Arterial Insufficiency Ulcer Etiology: Wound Location: Left Calcaneus Wound Open Wounding Event: Gradually Appeared Status: Date Acquired: 07/04/2014 Comorbid Cataracts, Glaucoma, Sleep Apnea, Weeks Of Treatment: 5 History: Hypertension, Myocardial Infarction, Clustered Wound: No Type II Diabetes, History of pressure wounds, Rheumatoid Arthritis, Neuropathy, Received Chemotherapy, Received Radiation Photos Photo Uploaded By: Alric Quan on 02/13/2015 17:48:59 Wound Measurements Length: (cm) 4 Width: (cm) 2 Depth: (cm) 0.3 Area: (cm) 6.283 Volume: (cm) 1.885 % Reduction in Area: 58.7% % Reduction in Volume: 38% Epithelialization: None Tunneling: No Undermining: Yes Starting Position (o'clock): 1 Maximum Distance: (cm) 2 Wound Description Full Thickness Without Classification: Exposed Support Structures Diabetic Severity Grade 2 (Wagner): Wound Margin: Thickened BLIMY, TUDOR ([id number]) Foul Odor After Cleansing: Yes Due to Product Use: No Exudate Amount: Large Exudate Type: Purulent Exudate Color: yellow, brown, green Wound Bed Granulation Amount: Large (67-100%) Exposed Structure Granulation Quality: Red, Pink Fascia Exposed: No Necrotic Amount: Small (1-33%) Fat Layer Exposed: No Necrotic Quality: Adherent Slough Tendon Exposed: No Muscle Exposed: No Joint Exposed: No Bone Exposed: No Limited to Skin Breakdown Periwound Skin  Texture Texture Color No Abnormalities Noted: No No Abnormalities Noted: No Localized Edema: Yes Temperature / Pain Moisture Temperature: No Abnormality No Abnormalities Noted: No Tenderness on Palpation: Yes Maceration: Yes Moist: Yes Wound Preparation Ulcer Cleansing: Rinsed/Irrigated with Saline Topical Anesthetic Applied: Other: lidocaine 4%, Treatment Notes Wound #1 (Left Calcaneus) 1. Cleansed with: Clean wound with Normal Saline 2. Anesthetic Topical Lidocaine 4% cream to wound bed prior to debridement 3. Peri-wound Care: Barrier cream 4. Dressing Applied: Santyl Ointment 5. Secondary Dressing Applied Bordered Foam Dressing Dry Gauze Notes heel protector Electronic Signature(s) Signed: 02/13/2015 5:45:24 PM By: Alric Quan Entered By: Alric Quan on 02/13/2015 11:37:09 ZANYAH, WACH ([id number]) Camps, Cassi H. (  [id number]) -------------------------------------------------------------------------------- Vitals Details Patient Name: SHANAIA, DROZDA. Date of Service: 02/13/2015 10:45 AM Medical Record Number: [id number] Patient Account Number: [id number] Date of Birth/Sex: 1940/05/18 (75 y.o. Female) Treating RN: Carolyne Fiscal, Debi Primary Care Physician: SYSTEM, PCP Other Clinician: Referring Physician: Durenda Guthrie Treating Physician/Extender: Frann Rider in Treatment: 5 Vital Signs Time Taken: 11:12 Temperature (F): 97.5 Height (in): 65 Pulse (bpm): 85 Weight (lbs): 185 Respiratory Rate (breaths/min): 18 Body Mass Index (BMI): 30.8 Blood Pressure (mmHg): 126/87 Reference Range: 80 - 120 mg / dl Electronic Signature(s) Signed: 02/13/2015 5:45:24 PM By: Alric Quan Entered By: Alric Quan on 02/13/2015 11:15:05

## 2015-02-14 NOTE — Progress Notes (Signed)
[name], [name] ([id number]) Visit Report for 02/13/2015 Chief Complaint Document Details Patient Name: [name], [name]. Date of Service: 02/13/2015 10:45 AM Medical Record Number: [id number] Patient Account Number: [id number] Date of Birth/Sex: October 02, 1940 (75 y.o. Female) Treating RN: Carolyne Fiscal, Debi Primary Care Physician: SYSTEM, PCP Other Clinician: Referring Physician: Durenda Guthrie Treating Physician/Extender: Frann Rider in Treatment: 5 Information Obtained from: Patient Chief Complaint Patients presents for treatment of an open diabetic ulcer and pressure ulcer to her left heel which she has had for 6 months. Electronic Signature(s) Signed: 02/13/2015 11:42:56 AM By: Christin Fudge MD, FACS Entered By: Christin Fudge on 02/13/2015 11:42:56 WILLMA, OBANDO ([id number]) -------------------------------------------------------------------------------- Debridement Details Patient Name: Tricia, [name]. Date of Service: 02/13/2015 10:45 AM Medical Record Number: [id number] Patient Account Number: [id number] Date of Birth/Sex: January 19, 1940 (75 y.o. Female) Treating RN: Carolyne Fiscal, Debi Primary Care Physician: SYSTEM, PCP Other Clinician: Referring Physician: Durenda Guthrie Treating Physician/Extender: Frann Rider in Treatment: 5 Debridement Performed for Wound #1 Left Calcaneus Assessment: Performed By: Physician Christin Fudge, MD Debridement: Debridement Pre-procedure Yes Verification/Time Out Taken: Start Time: 11:34 Pain Control: Other : lidocaine 4% cream Level: Skin/Subcutaneous Tissue Total Area Debrided (L x 4 (cm) x 2 (cm) = 8 (cm) W): Tissue and other Viable, Non-Viable, Exudate, Fibrin/Slough, Subcutaneous material debrided: Instrument: Curette, Forceps, Scissors Bleeding: Moderate Hemostasis Achieved: Silver Nitrate End Time: 11:38 Procedural Pain: 0 Post Procedural Pain: 0 Response to Treatment: Procedure was tolerated well Post Debridement  Measurements of Total Wound Length: (cm) 4 Width: (cm) 2 Depth: (cm) 0.4 Volume: (cm) 2.513 Post Procedure Diagnosis Same as Pre-procedure Electronic Signature(s) Signed: 02/13/2015 11:42:33 AM By: Christin Fudge MD, FACS Signed: 02/13/2015 5:45:24 PM By: Alric Quan Entered By: Christin Fudge on 02/13/2015 11:42:23 JAEANNA, MCCOMBER ([id number]) -------------------------------------------------------------------------------- HPI Details Patient Name: [name]. Date of Service: 02/13/2015 10:45 AM Medical Record Number: [id number] Patient Account Number: [id number] Date of Birth/Sex: 12/24/1940 (75 y.o. Female) Treating RN: Carolyne Fiscal, Debi Primary Care Physician: SYSTEM, PCP Other Clinician: Referring Physician: Durenda Guthrie Treating Physician/Extender: Frann Rider in Treatment: 5 History of Present Illness Location: ulceration of the left heel and calcaneal region Quality: Patient reports experiencing a dull pain to affected area(s). Severity: Patient states wound are getting worse. Duration: Patient has had the wound for >6 months prior to seeking treatment at the wound center Timing: Pain in wound is Intermittent (comes and goes Context: The wound appeared gradually over time Modifying Factors: Other treatment(s) tried include: Santyl ointment locally Associated Signs and Symptoms: Patient reports having difficulty standing for long periods. HPI Description: 75 year old female who comes to see [place] for a left heel pressure injury which she has had for 6 months. 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 She is status post stem cell transplant. as per her  oncologist Dr. Grayland Ormond she has a PET scan to be done and may need a lymph node biopsy and a bone marrow biopsy in the near future. He is also status post abdominal hysterectomy, left toe amputation and eyes surgery. 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 discussed this result with the family at the bedside and recommended surgical debridement in the operating room so as to clean this  out to the best of our ability. However the daughter informs me that a lot of other issues have to be addressed at the present time including a possible colon cancer and a recurrence of her lymphoma. They would like to defer surgical debridement of the heel to a later date 12/13/2015 -- patient has not been seen for about a month and during this time she has had a injury to her left ankle and may have a fracture and has been in a posterior slab. I understand she is getting her dressings done daily and has an orthopedic appointment this afternoon. She also has a possible colon cancer and this has not been investigated with a colonoscopy. Electronic Signature(s) Signed: 02/13/2015 11:43:49 AM By: Christin Fudge MD, FACS Entered By: Christin Fudge on 02/13/2015 11:43:49 BEXLEE, BERGDOLL ([id number]) -------------------------------------------------------------------------------- Physical Exam Details Patient Name: Tricia, Lee. Date of Service: 02/13/2015 10:45 AM Medical Record Number: [id number] Patient Account Number: [id number] Date of Birth/Sex: [birth year] (75 y.o. Female) Treating RN: Carolyne Fiscal, Debi Primary Care Physician: SYSTEM, PCP Other Clinician: Referring Physician: Durenda Guthrie Treating Physician/Extender: Frann Rider in Treatment: 5 Constitutional . Pulse regular. Respirations normal and unlabored. Afebrile. . Eyes Nonicteric. Reactive to light. Ears, Nose, Mouth, and Throat Lips, teeth, and gums WNL.Marland Kitchen Moist  mucosa without lesions. Neck supple and nontender. No palpable supraclavicular or cervical adenopathy. Normal sized without goiter. Respiratory WNL. No retractions.. Cardiovascular Pedal Pulses WNL. No clubbing, cyanosis or edema. Chest Breasts symmetical and no nipple discharge.. Breast tissue WNL, no masses, lumps, or tenderness.. Lymphatic No adneopathy. No adenopathy. No adenopathy. Musculoskeletal Adexa without tenderness or enlargement.. Digits and nails w/o clubbing, cyanosis, infection, petechiae, ischemia, or inflammatory conditions.. Integumentary (Hair, Skin) No suspicious lesions. No crepitus or fluctuance. No peri-wound warmth or erythema. No masses.Marland Kitchen Psychiatric Judgement and insight Intact.. No evidence of depression, anxiety, or agitation.. Notes the left heel has quite a bit of debris which needed to be sharply dissected with forceps and scissors and some of the bleeding was controlled with Silver nitrate. Electronic Signature(s) Signed: 02/13/2015 11:44:26 AM By: Christin Fudge MD, FACS Entered By: Christin Fudge on 02/13/2015 11:44:25 MADYSUN, THALL ([id number]) -------------------------------------------------------------------------------- Physician Orders Details Patient Name: NARDA, FUNDORA. Date of Service: 02/13/2015 10:45 AM Medical Record Number: [id number] Patient Account Number: [id number] Date of Birth/Sex: [birth year] (75 y.o. Female) Treating RN: Carolyne Fiscal, Debi Primary Care Physician: SYSTEM, PCP Other Clinician: Referring Physician: Durenda Guthrie Treating Physician/Extender: Frann Rider in Treatment: 5 Verbal / Phone Orders: Yes Clinician: Pinkerton, Debi Read Back and Verified: Yes Diagnosis Coding Wound Cleansing Wound #1 Left Calcaneus o Clean wound with Normal Saline. o Cleanse wound with mild soap and water o May Shower, gently pat wound dry prior to applying new dressing. Anesthetic Wound #1 Left Calcaneus o Topical  Lidocaine 4% cream applied to wound bed prior to debridement Skin Barriers/Peri-Wound Care Wound #1 Left Calcaneus o Barrier cream Primary Wound Dressing Wound #1 Left Calcaneus o Santyl Ointment Secondary Dressing Wound #1 Left Calcaneus o Dry Gauze o Boardered Foam Dressing - heel protector Dressing Change Frequency Wound #1 Left Calcaneus o Change dressing every day. - PLEASE CHANGE DAILY Follow-up Appointments Wound #1 Left Calcaneus o Return Appointment in 1 week. Off-Loading o Other: - When lying in bed please float the heels Medications-please add to medication list. MARTHELLA, OSORNO ([id number]) Wound #1 Left Calcaneus o Other: - Please give pt Vitamin C. Zinc, and Multi Vitamins daily. Electronic Signature(s) Signed: 02/13/2015 3:57:16 PM By: Con Memos,  Roderick Pee MD, FACS Signed: 02/13/2015 5:45:24 PM By: Alric Quan Entered By: Alric Quan on 02/13/2015 11:39:18 [name] ([id number]) -------------------------------------------------------------------------------- Problem List Details Patient Name: CORDIE, BEAZLEY. Date of Service: 02/13/2015 10:45 AM Medical Record Number: [id number] Patient Account Number: [id number] Date of Birth/Sex: 1940/06/13 (75 y.o. Female) Treating RN: Carolyne Fiscal, Debi Primary Care Physician: SYSTEM, PCP Other Clinician: Referring Physician: Durenda Guthrie Treating Physician/Extender: Frann Rider in Treatment: 5 Active Problems ICD-10 Encounter Code Description Active Date Diagnosis E11.621 Type 2 diabetes mellitus with foot ulcer 01/09/2015 Yes L89.623 Pressure ulcer of left heel, stage 3 01/09/2015 Yes C83.10 Mantle cell lymphoma, unspecified site 01/09/2015 Yes Z92.21 Personal history of antineoplastic chemotherapy 01/09/2015 Yes Inactive Problems Resolved Problems Electronic Signature(s) Signed: 02/13/2015 11:42:14 AM By: Christin Fudge MD, FACS Previous Signature: 02/13/2015 11:39:46 AM Version By: Christin Fudge MD, FACS Entered By: Christin Fudge on 02/13/2015 11:42:14 [name] ([id number]) -------------------------------------------------------------------------------- Progress Note Details Patient Name: [name]. Date of Service: 02/13/2015 10:45 AM Medical Record Number: [id number] Patient Account Number: [id number] Date of Birth/Sex: [birth year] (75 y.o. Female) Treating RN: Carolyne Fiscal, Debi Primary Care Physician: SYSTEM, PCP Other Clinician: Referring Physician: Durenda Guthrie Treating Physician/Extender: Frann Rider in Treatment: 5 Subjective Chief Complaint Information obtained from Patient Patients presents for treatment of an open diabetic ulcer and pressure ulcer to her left heel which she has had for 6 months. History of Present Illness (HPI) The following HPI elements were documented for the patient's wound: Location: ulceration of the left heel and calcaneal region Quality: Patient reports experiencing a dull pain to affected area(s). Severity: Patient states wound are getting worse. Duration: Patient has had the wound for >6 months prior to seeking treatment at the wound center Timing: Pain in wound is Intermittent (comes and goes Context: The wound appeared gradually over time Modifying Factors: Other treatment(s) tried include: Santyl ointment locally Associated Signs and Symptoms: Patient reports having difficulty standing for long periods. 75 year old female who comes to see [place] for a left heel pressure injury which she has had for 6 months. 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 She is status post stem cell transplant. as per her oncologist  Dr. Grayland Ormond she has a PET scan to be done and may need a lymph node biopsy and a bone marrow biopsy in the near future. He is also status post abdominal hysterectomy, left toe amputation and eyes surgery. 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 discussed this result with the family at the bedside and recommended surgical debridement in the operating room so as to clean this out to the best of our ability. However the daughter informs me that a lot of other issues have to be addressed at the present time including a possible colon cancer and a recurrence of her lymphoma. They would like to defer surgical debridement of the heel to a later date 12/13/2015 -- patient has not been seen for about a month and during this time she has had a injury to her left ankle and may have a fracture and has been in a posterior slab. I understand she is getting her dressings done daily and has an orthopedic appointment this afternoon. She also has  a possible colon cancer and this has not been investigated with a colonoscopy. CALEA, HRIBAR ([id number]) Objective Constitutional Pulse regular. Respirations normal and unlabored. Afebrile. Vitals Time Taken: 11:12 AM, Height: 65 in, Weight: 185 lbs, BMI: 30.8, Temperature: 97.5 F, Pulse: 85 bpm, Respiratory Rate: 18 breaths/min, Blood Pressure: 126/87 mmHg. Eyes Nonicteric. Reactive to light. Ears, Nose, Mouth, and Throat Lips, teeth, and gums WNL.Marland Kitchen Moist mucosa without lesions. Neck supple and nontender. No palpable supraclavicular or cervical adenopathy. Normal sized without goiter. Respiratory WNL. No retractions.. Cardiovascular Pedal Pulses WNL. No clubbing, cyanosis or edema. Chest Breasts symmetical and no nipple discharge.. Breast tissue WNL, no masses, lumps, or tenderness.. Lymphatic No adneopathy. No adenopathy. No adenopathy. Musculoskeletal Adexa without tenderness or  enlargement.. Digits and nails w/o clubbing, cyanosis, infection, petechiae, ischemia, or inflammatory conditions.Marland Kitchen Psychiatric Judgement and insight Intact.. No evidence of depression, anxiety, or agitation.. General Notes: the left heel has quite a bit of debris which needed to be sharply dissected with forceps and scissors and some of the bleeding was controlled with Silver nitrate. Integumentary (Hair, Skin) No suspicious lesions. No crepitus or fluctuance. No peri-wound warmth or erythema. No masses.Marland Kitchen CHARVI, GAMMAGE ([id number]) Wound #1 status is Open. Original cause of wound was Gradually Appeared. The wound is located on the Left Calcaneus. The wound measures 4cm length x 2cm width x 0.3cm depth; 6.283cm^2 area and 1.885cm^3 volume. The wound is limited to skin breakdown. There is no tunneling noted, however, there is undermining starting at 1:00 and ending at :00 with a maximum distance of 2cm. There is a large amount of purulent drainage noted. The wound margin is thickened. There is large (67-100%) red, pink granulation within the wound bed. There is a small (1-33%) amount of necrotic tissue within the wound bed including Adherent Slough. The periwound skin appearance exhibited: Localized Edema, Maceration, Moist. Periwound temperature was noted as No Abnormality. The periwound has tenderness on palpation. Assessment Active Problems ICD-10 E11.621 - Type 2 diabetes mellitus with foot ulcer L89.623 - Pressure ulcer of left heel, stage 3 C83.10 - Mantle cell lymphoma, unspecified site Z92.21 - Personal history of antineoplastic chemotherapy This patient has several things happening and I have been unable to get her to the operating room for a surgical debridement and have not been able to get her ABI scheduled because of various other impending appointments. she is going to see the orthopedic doctor today and if he is going to do a procedure and anesthesia it may be worth  debriding the heel in the OR. In the meanwhile we will continue with Santyl ointment locally. She will come back and see me next week if possible. 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 There was a Skin/Subcutaneous Tissue Debridement ([id number]) debridement with total area of 8 sq cm performed by Christin Fudge, MD. with the following instrument(s): Curette, Forceps, and Scissors to remove Viable and Non-Viable tissue/material including Exudate, Fibrin/Slough, and Subcutaneous after achieving pain control using Other (lidocaine 4% cream). A time out was conducted prior to the start of the procedure. A Moderate amount of bleeding was controlled with Silver Nitrate. The procedure was tolerated well with a pain level of 0 throughout and a pain level of 0 following the procedure. Post Debridement Measurements: 4cm length x 2cm width x 0.4cm depth; 2.513cm^3 volume. SUHANA, WILNER ([id number]) Post procedure Diagnosis Wound #1: Same as Pre-Procedure Plan Wound Cleansing: Wound #1 Left Calcaneus: Clean wound with  Normal Saline. Cleanse wound with mild soap and water May Shower, gently pat wound dry prior to applying new dressing. Anesthetic: Wound #1 Left Calcaneus: Topical Lidocaine 4% cream applied to wound bed prior to debridement Skin Barriers/Peri-Wound Care: Wound #1 Left Calcaneus: Barrier cream Primary Wound Dressing: Wound #1 Left Calcaneus: Santyl Ointment Secondary Dressing: Wound #1 Left Calcaneus: Dry Gauze Boardered Foam Dressing - heel protector Dressing Change Frequency: Wound #1 Left Calcaneus: Change dressing every day. - PLEASE CHANGE DAILY Follow-up Appointments: Wound #1 Left Calcaneus: Return Appointment in 1 week. Off-Loading: Other: - When lying in bed please float the heels Medications-please add to medication list.: Wound #1 Left Calcaneus: Other: - Please give pt Vitamin C. Zinc, and Multi  Vitamins daily. This patient has several things happening and I have been unable to get her to the operating room for a surgical debridement and have not been able to get her ABI scheduled because of various other impending appointments. VICKKI, IGOU ([id number]) she is going to see the orthopedic doctor today and if he is going to do a procedure and anesthesia it may be worth debriding the heel in the OR. In the meanwhile we will continue with Santyl ointment locally. She will come back and see me next week if possible. Electronic Signature(s) Signed: 02/13/2015 11:45:41 AM By: Christin Fudge MD, FACS Entered By: Christin Fudge on 02/13/2015 11:45:41 VALLORIE, NICCOLI ([id number]) -------------------------------------------------------------------------------- SuperBill Details Patient Name: [name] Date of Service: 02/13/2015 Medical Record Number: [id number] Patient Account Number: [id number] Date of Birth/Sex: [birth year] (75 y.o. Female) Treating RN: Carolyne Fiscal, Debi Primary Care Physician: SYSTEM, PCP Other Clinician: Referring Physician: Durenda Guthrie Treating Physician/Extender: Frann Rider in Treatment: 5 Diagnosis Coding ICD-10 Codes Code Description E11.621 Type 2 diabetes mellitus with foot ulcer L89.623 Pressure ulcer of left heel, stage 3 C83.10 Mantle cell lymphoma, unspecified site Z92.21 Personal history of antineoplastic chemotherapy Facility Procedures CPT4 Code: [id number] Description: 85927 - DEB SUBQ TISSUE 20 SQ CM/< ICD-10 Description Diagnosis E11.621 Type 2 diabetes mellitus with foot ulcer L89.623 Pressure ulcer of left heel, stage 3 Z92.21 Personal history of antineoplastic chemotherapy Modifier: Quantity: 1 Physician Procedures CPT4 Code: [id number] Description: 03794 - WC PHYS SUBQ TISS 20 SQ CM ICD-10 Description Diagnosis E11.621 Type 2 diabetes mellitus with foot ulcer L89.623 Pressure ulcer of left heel, stage 3 Z92.21 Personal history  of antineoplastic chemotherapy Modifier: Quantity: 1 Electronic Signature(s) Signed: 02/13/2015 11:45:54 AM By: Christin Fudge MD, FACS Entered By: Christin Fudge on 02/13/2015 11:45:53

## 2015-02-20 ENCOUNTER — Encounter (HOSPITAL_BASED_OUTPATIENT_CLINIC_OR_DEPARTMENT_OTHER): Payer: Medicare (Managed Care) | Admitting: General Surgery

## 2015-02-20 ENCOUNTER — Encounter: Payer: Self-pay | Admitting: General Surgery

## 2015-02-20 DIAGNOSIS — L8990 Pressure ulcer of unspecified site, unspecified stage: Secondary | ICD-10-CM | POA: Diagnosis not present

## 2015-02-20 DIAGNOSIS — E11621 Type 2 diabetes mellitus with foot ulcer: Secondary | ICD-10-CM | POA: Diagnosis not present

## 2015-02-20 NOTE — Progress Notes (Signed)
seeiheal 

## 2015-02-21 NOTE — Progress Notes (Signed)
[name], [name] ([id number]) Visit Report for 02/20/2015 Arrival Information Details Patient Name: [name], [name]. Date of Service: 02/20/2015 10:45 AM Medical Record Number: [id number] Patient Account Number: [id number] Date of Birth/Sex: June 18, 1940 (75 y.o. Female) Treating RN: Carolyne Fiscal, Debi Primary Care Physician: SYSTEM, PCP Other Clinician: Referring Physician: Durenda Guthrie Treating Physician/Extender: Frann Rider in Treatment: 6 Visit Information History Since Last Visit All ordered tests and consults were completed: No Patient Arrived: Wheel Chair Added or deleted any medications: No Arrival Time: 11:09 Any new allergies or adverse reactions: No Accompanied By: son in law Had a fall or experienced change in No activities of daily living that may affect Transfer Assistance: Other risk of falls: Patient Identification Verified: Yes Signs or symptoms of abuse/neglect since last No Secondary Verification Process Yes visito Completed: Hospitalized since last visit: No Patient Requires Transmission-Based No Pain Present Now: No Precautions: Patient Has Alerts: Yes Patient Alerts: DM II Electronic Signature(s) Signed: 02/20/2015 4:59:51 PM By: Alric Quan Entered By: Alric Quan on 02/20/2015 11:10:04 [name] ([id number]) -------------------------------------------------------------------------------- Clinic Level of Care Assessment Details Patient Name: [name]. Date of Service: 02/20/2015 10:45 AM Medical Record Number: [id number] Patient Account Number: [id number] Date of Birth/Sex: 03-17-1940 (75 y.o. Female) Treating RN: Carolyne Fiscal, Debi Primary Care Physician: SYSTEM, PCP Other Clinician: Referring Physician: Durenda Guthrie Treating Physician/Extender: Benjaman Pott in Treatment: 6 Clinic Level of Care Assessment Items TOOL 4 Quantity Score X - Use when only an EandM is performed on FOLLOW-UP visit 1 0 ASSESSMENTS -  Nursing Assessment / Reassessment []  - Reassessment of Co-morbidities (includes updates in patient status) 0 X - Reassessment of Adherence to Treatment Plan 1 5 ASSESSMENTS - Wound and Skin Assessment / Reassessment []  - Simple Wound Assessment / Reassessment - one wound 0 X - Complex Wound Assessment / Reassessment - multiple wounds 2 5 []  - Dermatologic / Skin Assessment (not related to wound area) 0 ASSESSMENTS - Focused Assessment []  - Circumferential Edema Measurements - multi extremities 0 []  - Nutritional Assessment / Counseling / Intervention 0 X - Lower Extremity Assessment (monofilament, tuning fork, pulses) 1 5 []  - Peripheral Arterial Disease Assessment (using hand held doppler) 0 ASSESSMENTS - Ostomy and/or Continence Assessment and Care []  - Incontinence Assessment and Management 0 []  - Ostomy Care Assessment and Management (repouching, etc.) 0 PROCESS - Coordination of Care []  - Simple Patient / Family Education for ongoing care 0 X - Complex (extensive) Patient / Family Education for ongoing care 1 20 []  - Staff obtains Programmer, systems, Records, Test Results / Process Orders 0 X - Staff telephones HHA, Nursing Homes / Clarify orders / etc 1 10 []  - Routine Transfer to another Facility (non-emergent condition) 0 [name], [name] ([id number]) []  - Routine Hospital Admission (non-emergent condition) 0 []  - New Admissions / Biomedical engineer / Ordering NPWT, Apligraf, etc. 0 []  - Emergency Hospital Admission (emergent condition) 0 X - Simple Discharge Coordination 1 10 []  - Complex (extensive) Discharge Coordination 0 PROCESS - Special Needs []  - Pediatric / Minor Patient Management 0 []  - Isolation Patient Management 0 []  - Hearing / Language / Visual special needs 0 []  - Assessment of Community assistance (transportation, D/C planning, etc.) 0 []  - Additional assistance / Altered mentation 0 []  - Support Surface(s) Assessment (bed, cushion, seat, etc.) 0 INTERVENTIONS  - Wound Cleansing / Measurement []  - Simple Wound Cleansing - one wound 0 X - Complex Wound Cleansing - multiple wounds 2 5 X - Wound Imaging (photographs -  any number of wounds) 1 5 []  - Wound Tracing (instead of photographs) 0 []  - Simple Wound Measurement - one wound 0 X - Complex Wound Measurement - multiple wounds 2 5 INTERVENTIONS - Wound Dressings []  - Small Wound Dressing one or multiple wounds 0 X - Medium Wound Dressing one or multiple wounds 2 15 []  - Large Wound Dressing one or multiple wounds 0 X - Application of Medications - topical 1 5 []  - Application of Medications - injection 0 INTERVENTIONS - Miscellaneous []  - External ear exam 0 [name], [name]. ([id number]) []  - Specimen Collection (cultures, biopsies, blood, body fluids, etc.) 0 []  - Specimen(s) / Culture(s) sent or taken to Lab for analysis 0 []  - Patient Transfer (multiple staff / Harrel Lemon Lift / Similar devices) 0 []  - Simple Staple / Suture removal (25 or less) 0 []  - Complex Staple / Suture removal (26 or more) 0 []  - Hypo / Hyperglycemic Management (close monitor of Blood Glucose) 0 []  - Ankle / Brachial Index (ABI) - do not check if billed separately 0 X - Vital Signs 1 5 Has the patient been seen at the hospital within the last three years: Yes Total Score: 125 Level Of Care: New/Established - Level 4 Electronic Signature(s) Signed: 02/20/2015 4:59:51 PM By: Alric Quan Entered By: Alric Quan on 02/20/2015 16:21:20 [name] ([id number]) -------------------------------------------------------------------------------- Encounter Discharge Information Details Patient Name: [name]. Date of Service: 02/20/2015 10:45 AM Medical Record Number: [id number] Patient Account Number: [id number] Date of Birth/Sex: 16-Mar-1940 (75 y.o. Female) Treating RN: Carolyne Fiscal, Debi Primary Care Physician: SYSTEM, PCP Other Clinician: Referring Physician: Durenda Guthrie Treating Physician/Extender:  Benjaman Pott in Treatment: 6 Encounter Discharge Information Items Discharge Pain Level: 0 Discharge Condition: Stable Ambulatory Status: Wheelchair Discharge Destination: Nursing Home Transportation: Other Accompanied By: son in law Schedule Follow-up Appointment: Yes Medication Reconciliation completed and provided to Patient/Care Yes Belkis Norbeck: Provided on Clinical Summary of Care: 02/20/2015 Form Type Recipient Paper Patient FD Electronic Signature(s) Signed: 02/20/2015 12:42:46 PM By: Judene Companion MD Previous Signature: 02/20/2015 11:41:16 AM Version By: Ruthine Dose Entered By: Judene Companion on 02/20/2015 11:49:18 [name], [name] ([id number]) -------------------------------------------------------------------------------- Lower Extremity Assessment Details Patient Name: [name]. Date of Service: 02/20/2015 10:45 AM Medical Record Number: [id number] Patient Account Number: [id number] Date of Birth/Sex: 05/06/1940 (75 y.o. Female) Treating RN: Carolyne Fiscal, Debi Primary Care Physician: SYSTEM, PCP Other Clinician: Referring Physician: Durenda Guthrie Treating Physician/Extender: Benjaman Pott in Treatment: 6 Vascular Assessment Pulses: Posterior Tibial Dorsalis Pedis Palpable: [Left:Yes] Extremity colors, hair growth, and conditions: Extremity Color: [Left:Normal] Temperature of Extremity: [Left:Warm] Capillary Refill: [Left:< 3 seconds] Toe Nail Assessment Left: Right: Thick: Yes Discolored: Yes Deformed: No Improper Length and Hygiene: Yes Electronic Signature(s) Signed: 02/20/2015 4:59:51 PM By: Alric Quan Entered By: Alric Quan on 02/20/2015 11:12:34 [name], [name] ([id number]) -------------------------------------------------------------------------------- Multi Wound Chart Details Patient Name: [name]. Date of Service: 02/20/2015 10:45 AM Medical Record Number: [id number] Patient Account Number: [id number] Date of  Birth/Sex: 1940-05-16 (74 y.o. 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] [N/A:N/A] Comorbid History: [1:Cataracts, Glaucoma,  Sleep Apnea, Hypertension, Myocardial Hypertension, Myocardial Infarction, Type II Diabetes, History of pressure wounds, Rheumatoid Arthritis, Neuropathy, Received Chemotherapy, Received Chemotherapy, Received  Radiation] [2:Cataracts, Glaucoma, Sleep Apnea, Infarction, Type II Diabetes, History of pressure wounds, Rheumatoid Arthritis, Neuropathy, Received Radiation] [N/A:N/A] Date Acquired: [1:07/04/2014] [2:02/20/2015] [N/A:N/A] Weeks of Treatment: [1:6] [2:0] [N/A:N/A] Wound Status: [1:Open] [2:Open] [N/A:N/A] Measurements L x W x D 2x2x0.5 [2:1.5x1.5x1] [N/A:N/A] (cm) Area (cm) : [1:3.142] [2:1.767] [N/A:N/A] Volume (cm) : [1:1.571] [2:1.767] [N/A:N/A] % Reduction in Area: [1:79.30%] [2:N/A] [N/A:N/A] % Reduction in Volume: 48.30% [2:N/A] [N/A:N/A] Classification: [1:Full Thickness Without Exposed Support Structures] [2:Category/Stage II] [N/A:N/A] HBO Classification: [1:Grade 2] [2:Grade 1] [N/A:N/A] Exudate Amount: [1:Large] [2:Large] [N/A:N/A] Exudate Type: [1:Purulent] [2:Serous] [N/A:N/A] Exudate Color: [1:yellow, brown, green] [2:amber] [N/A:N/A] Foul Odor After Yes No N/A Cleansing: Odor Anticipated Due to No N/A N/A Product Use: Wound Margin: Thickened Flat and  Intact N/A Granulation Amount: None Present (0%) None Present (0%) N/A Necrotic Amount: Large (67-100%) Large (67-100%) N/A Exposed Structures: Fascia: No Fascia: No N/A Fat: No Fat: No Tendon: No Tendon: No Muscle: No Muscle: No Joint: No Joint: No Bone: No Bone: No Limited to Skin Limited to Skin Breakdown Breakdown Epithelialization: None None N/A Periwound Skin Texture: Edema: Yes Edema: Yes N/A Periwound Skin Maceration: Yes Maceration: Yes N/A Moisture: Moist: Yes Moist: Yes Periwound Skin Color: No Abnormalities Noted No Abnormalities Noted N/A Temperature: No Abnormality N/A N/A Tenderness on Yes No N/A Palpation: Wound Preparation: Ulcer Cleansing: Ulcer Cleansing: N/A Rinsed/Irrigated with Rinsed/Irrigated with Saline Saline Topical Anesthetic Topical Anesthetic Applied: Other: lidocaine Applied: Other: lidocaine 4% 4% Treatment Notes Electronic Signature(s) Signed: 02/20/2015 4:59:51 PM By: Alric Quan Entered By: Alric Quan on 02/20/2015 11:23:51 [name], [name] ([id number]) -------------------------------------------------------------------------------- [place] Details Patient Name: [name], [name]. Date of Service: 02/20/2015 10:45 AM Medical Record Number: [id number] Patient Account Number: [id number] Date of Birth/Sex: November 20, 1940 (75 y.o. Female) Treating RN: Carolyne Fiscal, Debi Primary Care Physician: SYSTEM, PCP Other Clinician: Referring Physician: Durenda Guthrie Treating Physician/Extender: Benjaman Pott in Treatment: 6 Active Inactive Abuse / Safety / Falls / Self Care Management Nursing Diagnoses: Knowledge deficit related to: safety; personal, health (wound), emergency Potential for falls Goals: Patient will remain injury free Date Initiated: 01/09/2015 Goal Status: Active Patient/caregiver will verbalize understanding of skin care regimen Date Initiated: 01/09/2015 Goal Status:  Active Patient/caregiver will verbalize/demonstrate measures taken to improve the patient's personal safety Date Initiated: 01/09/2015 Goal Status: Active Interventions: Assess fall risk on admission and as needed Assess: immobility, friction, shearing, incontinence upon admission and as needed Notes: Nutrition Nursing Diagnoses: Imbalanced nutrition Goals: Patient/caregiver agrees to and verbalizes understanding of need to use nutritional supplements and/or vitamins as prescribed Date Initiated: 01/09/2015 Goal Status: Active Patient/caregiver will maintain therapeutic glucose control Date Initiated: 01/09/2015 Goal Status: Active [name], [name] ([id number]) Interventions: Assess patient nutrition upon admission and as needed per policy Provide education on elevated blood sugars and impact on wound healing Notes: Orientation to the Wound Care Program Nursing Diagnoses: Knowledge deficit related to the wound healing center program Goals: Patient/caregiver will verbalize understanding of the [facility] Date Initiated: 01/09/2015 Goal Status: Active Interventions: Provide education on orientation to the wound center Notes: Pain, Acute or Chronic Nursing Diagnoses: Pain Management - Non-cyclic Acute (Procedural) Pain Management - Non-cyclic Chronic Pain Goals: Patient will verbalize adequate pain control and receive pain control interventions during procedures as needed Date Initiated: 01/09/2015 Goal Status: Active Patient/caregiver will verbalize adequate pain control between visits Date Initiated: 01/09/2015  Goal Status: Active Interventions: Assess comfort goal upon admission Complete pain assessment as per visit requirements Notes: Pressure Nursing Diagnoses: Knowledge deficit related to management of pressures ulcers JETZABEL, LASSERE ([id number]) Goals: Patient will remain free from development of additional pressure ulcers Date Initiated:  01/09/2015 Goal Status: Active Interventions: Assess: immobility, friction, shearing, incontinence upon admission and as needed Assess offloading mechanisms upon admission and as needed Notes: Wound/Skin Impairment Nursing Diagnoses: Impaired tissue integrity Goals: Patient/caregiver will verbalize understanding of skin care regimen Date Initiated: 01/09/2015 Goal Status: Active Ulcer/skin breakdown will have a volume reduction of 30% by week 4 Date Initiated: 01/09/2015 Goal Status: Active Ulcer/skin breakdown will have a volume reduction of 50% by week 8 Date Initiated: 01/09/2015 Goal Status: Active Ulcer/skin breakdown will have a volume reduction of 80% by week 12 Date Initiated: 01/09/2015 Goal Status: Active Interventions: Assess patient/caregiver ability to perform ulcer/skin care regimen upon admission and as needed Assess ulceration(s) every visit Notes: Electronic Signature(s) Signed: 02/20/2015 4:59:51 PM By: Alric Quan Entered By: Alric Quan on 02/20/2015 11:23:43 Behrmann, [name] ([id number]) -------------------------------------------------------------------------------- Pain Assessment Details Patient Name: [name]. Date of Service: 02/20/2015 10:45 AM Medical Record Number: [id number] Patient Account Number: [id number] Date of Birth/Sex: [birth year] (75 y.o. Female) Treating RN: Carolyne Fiscal, Debi Primary Care Physician: SYSTEM, PCP Other Clinician: Referring Physician: Durenda Guthrie Treating Physician/Extender: Frann Rider in Treatment: 6 Active Problems Location of Pain Severity and Description of Pain Patient Has Paino No Site Locations Pain Management and Medication Current Pain Management: Electronic Signature(s) Signed: 02/20/2015 4:59:51 PM By: Alric Quan Entered By: Alric Quan on 02/20/2015 11:10:19 [name]  ([id number]) -------------------------------------------------------------------------------- Patient/Caregiver Education Details Patient Name: [name] Date of Service: 02/20/2015 10:45 AM Medical Record Number: [id number] Patient Account Number: [id number] Date of Birth/Gender: 02-18-1940 (75 y.o. Female) Treating RN: Carolyne Fiscal, Debi Primary Care Physician: SYSTEM, PCP Other Clinician: Referring Physician: Durenda Guthrie Treating Physician/Extender: Benjaman Pott in Treatment: 6 Education Assessment Education Provided To: Patient and Caregiver Education Topics Provided Wound/Skin Impairment: Handouts: Other: change dressing as ordered Methods: Demonstration, Explain/Verbal Responses: State content correctly Electronic Signature(s) Signed: 02/20/2015 12:42:46 PM By: Judene Companion MD Entered By: Judene Companion on 02/20/2015 11:49:27 [name], [name] ([id number]) -------------------------------------------------------------------------------- Wound Assessment Details Patient Name: [name]. Date of Service: 02/20/2015 10:45 AM Medical Record Number: [id number] Patient Account Number: [id number] Date of Birth/Sex: Apr 21, 1940 (75 y.o. Female) Treating RN: Carolyne Fiscal, Debi Primary Care Physician: SYSTEM, PCP Other Clinician: Referring Physician: Durenda Guthrie Treating Physician/Extender: Benjaman Pott in Treatment: 6 Wound Status Wound Number: 1 Primary Arterial Insufficiency Ulcer Etiology: Wound Location: Left Calcaneus - Distal Wound Open Wounding Event: Gradually Appeared Status: Date Acquired: 07/04/2014 Comorbid Cataracts, Glaucoma, Sleep Apnea, Weeks Of Treatment: 6 History: Hypertension, Myocardial Infarction, Clustered Wound: No Type II Diabetes, History of pressure wounds, Rheumatoid Arthritis, Neuropathy, Received Chemotherapy, Received Radiation Wound Measurements Length: (cm) 2 Width: (cm) 2 Depth: (cm) 0.5 Area: (cm) 3.142 Volume:  (cm) 1.571 % Reduction in Area: 79.3% % Reduction in Volume: 48.3% Epithelialization: None Tunneling: No Undermining: No Wound Description Full Thickness Without Classification: Exposed Support Structures Diabetic Severity Grade 2 (Wagner): Wound Margin: Thickened Exudate Amount: Large Exudate Type: Purulent Exudate Color: yellow, brown, green Foul Odor After Cleansing: Yes Due to Product Use: No Wound Bed Granulation Amount: None Present (0%) Exposed Structure Necrotic Amount: Large (67-100%) Fascia Exposed: No Necrotic Quality: Adherent Slough Fat Layer Exposed: No Tendon Exposed: No Muscle Exposed: No Joint Exposed: No Bone Exposed: No Limited  to Skin Breakdown Periwound Skin Texture [name], JALLOH. ([id number]) Texture Color No Abnormalities Noted: No No Abnormalities Noted: No Localized Edema: Yes Temperature / Pain Moisture Temperature: No Abnormality No Abnormalities Noted: No Tenderness on Palpation: Yes Maceration: Yes Moist: Yes Wound Preparation Ulcer Cleansing: Rinsed/Irrigated with Saline Topical Anesthetic Applied: Other: lidocaine 4%, Treatment Notes Wound #1 (Left, Distal Calcaneus) 1. Cleansed with: Clean wound with Normal Saline 2. Anesthetic Topical Lidocaine 4% cream to wound bed prior to debridement 3. Peri-wound Care: Barrier cream 4. Dressing Applied: Santyl Ointment 5. Secondary Dressing Applied ABD and Kerlix/Conform 7. Secured with Tape Notes foam Electronic Signature(s) Signed: 02/20/2015 4:59:51 PM By: Alric Quan Entered By: Alric Quan on 02/20/2015 11:23:32 [name], [name] ([id number]) -------------------------------------------------------------------------------- Wound Assessment Details Patient Name: CLAREE, MCINERNY. Date of Service: 02/20/2015 10:45 AM Medical Record Number: [id number] Patient Account Number: [id number] Date of Birth/Sex: [birth year] (75 y.o. Female) Treating RN: Carolyne Fiscal,  Debi Primary Care Physician: SYSTEM, PCP Other Clinician: Referring Physician: Durenda Guthrie Treating Physician/Extender: Benjaman Pott in Treatment: 6 Wound Status Wound Number: 2 Primary Pressure Ulcer Etiology: Wound Location: Left Calcaneus - Proximal Wound Open Wounding Event: Pressure Injury Status: Date Acquired: 02/20/2015 Comorbid Cataracts, Glaucoma, Sleep Apnea, Weeks Of Treatment: 0 History: Hypertension, Myocardial Infarction, Clustered Wound: No Type II Diabetes, History of pressure wounds, Rheumatoid Arthritis, Neuropathy, Received Chemotherapy, Received Radiation Wound Measurements Length: (cm) 1.5 Width: (cm) 1.5 Depth: (cm) 1 Area: (cm) 1.767 Volume: (cm) 1.767 % Reduction in Area: % Reduction in Volume: Epithelialization: None Tunneling: No Undermining: No Wound Description Classification: Category/Stage II Foul Odor A Diabetic Severity (Wagner): Grade 1 Wound Margin: Flat and Intact Exudate Amount: Large Exudate Type: Serous Exudate Color: amber fter Cleansing: No Wound Bed Granulation Amount: None Present (0%) Exposed Structure Necrotic Amount: Large (67-100%) Fascia Exposed: No Necrotic Quality: Adherent Slough Fat Layer Exposed: No Tendon Exposed: No Muscle Exposed: No Joint Exposed: No Bone Exposed: No Limited to Skin Breakdown Periwound Skin Texture Texture Color SUNSHYNE, HANDWERK. ([id number]) No Abnormalities Noted: No No Abnormalities Noted: No Localized Edema: Yes Moisture No Abnormalities Noted: No Maceration: Yes Moist: Yes Wound Preparation Ulcer Cleansing: Rinsed/Irrigated with Saline Topical Anesthetic Applied: Other: lidocaine 4%, Treatment Notes Wound #2 (Left, Proximal Calcaneus) 1. Cleansed with: Clean wound with Normal Saline 2. Anesthetic Topical Lidocaine 4% cream to wound bed prior to debridement 3. Peri-wound Care: Barrier cream 4. Dressing Applied: Santyl Ointment 5. Secondary Dressing  Applied ABD and Kerlix/Conform 7. Secured with Tape Notes foam Electronic Signature(s) Signed: 02/20/2015 4:59:51 PM By: Alric Quan Entered By: Alric Quan on 02/20/2015 11:21:19 DEZEREA, QUI ([id number]) -------------------------------------------------------------------------------- Vitals Details Patient Name: [name] Date of Service: 02/20/2015 10:45 AM Medical Record Number: [id number] Patient Account Number: [id number] Date of Birth/Sex: 09/21/1940 (75 y.o. Female) Treating RN: Carolyne Fiscal, Debi Primary Care Physician: SYSTEM, PCP Other Clinician: Referring Physician: Durenda Guthrie Treating Physician/Extender: Frann Rider in Treatment: 6 Vital Signs Time Taken: 11:10 Temperature (F): 97.6 Height (in): 65 Pulse (bpm): 79 Weight (lbs): 185 Respiratory Rate (breaths/min): 18 Body Mass Index (BMI): 30.8 Blood Pressure (mmHg): 159/84 Reference Range: 80 - 120 mg / dl Electronic Signature(s) Signed: 02/20/2015 4:59:51 PM By: Alric Quan Entered By: Alric Quan on 02/20/2015 11:11:41

## 2015-02-21 NOTE — Progress Notes (Signed)
[name] ([id number]) Visit Report for 02/20/2015 Chief Complaint Document Details Patient Name: [name]. Date of Service: 02/20/2015 10:45 AM Medical Record Number: [id number] Patient Account Number: [id number] Date of Birth/Sex: March 12, 1940 (75 y.o. Female) Treating RN: Carolyne Fiscal, Debi Primary Care Physician: SYSTEM, PCP Other Clinician: Referring Physician: Durenda Guthrie Treating Physician/Extender: Benjaman Pott in Treatment: 6 Information Obtained from: Patient Chief Complaint Patients presents for treatment of an open diabetic ulcer and pressure ulcer to her left heel which she has had for 6 months. Electronic Signature(s) Signed: 02/20/2015 12:42:46 PM By: Judene Companion MD Entered By: Judene Companion on 02/20/2015 11:45:51 [name] ([id number]) -------------------------------------------------------------------------------- HPI Details Patient Name: [name]. Date of Service: 02/20/2015 10:45 AM Medical Record Number: [id number] Patient Account Number: [id number] Date of Birth/Sex: 10-15-1940 (75 y.o. Female) Treating RN: Carolyne Fiscal, Debi Primary Care Physician: SYSTEM, PCP Other Clinician: Referring Physician: Durenda Guthrie Treating Physician/Extender: Benjaman Pott in Treatment: 6 History of Present Illness Location: ulceration of the left heel and calcaneal region Quality: Patient reports experiencing a dull pain to affected area(s). Severity: Patient states wound are getting worse. Duration: Patient has had the wound for >6 months prior to seeking treatment at the wound center Timing: Pain in wound is Intermittent (comes and goes Context: The wound appeared gradually over time Modifying Factors: Other treatment(s) tried include: Santyl ointment locally Associated Signs and Symptoms: Patient reports having difficulty standing for long periods. HPI Description: 75 year old female who comes to see [place] for a left heel pressure injury  which she has had for 6 months. 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 She is status post stem cell transplant. as per her oncologist Dr. Grayland Ormond she has a PET scan to be done and may need a lymph node biopsy and a bone marrow biopsy in the near future. He is also status post abdominal hysterectomy, left toe amputation and eyes surgery. 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 discussed this result with the family at the bedside and recommended surgical debridement in the operating room so as to clean this out to the best of our ability. However the daughter informs me that a lot of other issues have to be addressed at the present time including a possible colon cancer and a recurrence of her lymphoma. They would like to defer surgical debridement of the heel to a later date 12/13/2015 -- patient has not been seen for about a month and during this time she has had a injury to her left ankle and may have a fracture and has been in a posterior slab. I understand she is getting her dressings done daily and has an orthopedic appointment this afternoon. She also has a possible colon cancer and this has not been investigated with a colonoscopy. Electronic Signature(s) Signed: 02/20/2015 12:42:46 PM By: Judene Companion MD Entered By: Judene Companion on 02/20/2015 11:46:09 [name] ([id number]) -------------------------------------------------------------------------------- Physical Exam Details Patient Name: [name]. Date of Service: 02/20/2015 10:45 AM Medical Record Number: [id number] Patient Account Number:  [id number] Date of Birth/Sex: [birth year] (75 y.o. Female) Treating RN: Carolyne Fiscal, Debi Primary Care Physician: SYSTEM, PCP Other Clinician: Referring Physician: Durenda Guthrie Treating Physician/Extender: Benjaman Pott in  Treatment: 6 Electronic Signature(s) Signed: 02/20/2015 12:42:46 PM By: Judene Companion MD Entered By: Judene Companion on 02/20/2015 11:46:16 [name] ([id number]) -------------------------------------------------------------------------------- Physician Orders Details Patient Name: [name] Date of Service: 02/20/2015 10:45 AM Medical Record Number: [id number] Patient Account Number: [id number] Date of Birth/Sex: [birth year] (75 y.o. Female) Treating RN: Carolyne Fiscal, Debi Primary Care Physician: SYSTEM, PCP Other Clinician: Referring Physician: Durenda Guthrie Treating Physician/Extender: Benjaman Pott in Treatment: 6 Verbal / Phone Orders: Yes ClinicianCarolyne Fiscal, Debi Read Back and Verified: Yes Diagnosis Coding Wound Cleansing Wound #1 Left,Distal Calcaneus o Clean wound with Normal Saline. o Cleanse wound with mild soap and water o May Shower, gently pat wound dry prior to applying new dressing. Wound #2 Left,Proximal Calcaneus o Clean wound with Normal Saline. o Cleanse wound with mild soap and water o May Shower, gently pat wound dry prior to applying new dressing. Anesthetic Wound #1 Left,Distal Calcaneus o Topical Lidocaine 4% cream applied to wound bed prior to debridement Wound #2 Left,Proximal Calcaneus o Topical Lidocaine 4% cream applied to wound bed prior to debridement Skin Barriers/Peri-Wound Care Wound #1 Left,Distal Calcaneus o Barrier cream Wound #2 Left,Proximal Calcaneus o Barrier cream Primary Wound Dressing Wound #1 Left,Distal Calcaneus o Santyl Ointment Wound #2 Left,Proximal Calcaneus o Santyl Ointment Secondary Dressing Wound #1 Left,Distal Calcaneus o Dry Gauze o Boardered  Foam Dressing - heel protector Hilario, Jalonda H. ([id number]) o Foam Wound #2 Left,Proximal Calcaneus o Dry Gauze o Boardered Foam Dressing - heel protector o Foam Dressing Change Frequency Wound #1 Left,Distal Calcaneus o Change dressing every day. - PLEASE CHANGE DAILY Wound #2 Left,Proximal Calcaneus o Change dressing every day. - PLEASE CHANGE DAILY Follow-up Appointments Wound #1 Left,Distal Calcaneus o Return Appointment in 1 week. Wound #2 Left,Proximal Calcaneus o Return Appointment in 1 week. Off-Loading Wound #1 Left,Distal Calcaneus o Other: - When lying in bed please float the heels Wound #2 Left,Proximal Calcaneus o Other: - When lying in bed please float the heels Medications-please add to medication list. Wound #1 Left,Distal Calcaneus o Other: - Please give pt Vitamin C. Zinc, and Multi Vitamins daily. Wound #2 Left,Proximal Calcaneus o Other: - Please give pt Vitamin C. Zinc, and Multi Vitamins daily. Electronic Signature(s) Signed: 02/20/2015 12:42:46 PM By: Judene Companion MD Signed: 02/20/2015 4:59:51 PM By: Alric Quan Entered By: Alric Quan on 02/20/2015 11:39:21 [name] ([id number]) -------------------------------------------------------------------------------- Problem List Details Patient Name: [name]. Date of Service: 02/20/2015 10:45 AM Medical Record Number: [id number] Patient Account Number: [id number] Date of Birth/Sex: October 24, 1940 (75 y.o. Female) Treating RN: Carolyne Fiscal, Debi Primary Care Physician: SYSTEM, PCP Other Clinician: Referring Physician: Durenda Guthrie Treating Physician/Extender: Benjaman Pott in Treatment: 6 Active Problems ICD-10 Encounter Code Description Active Date Diagnosis E11.621 Type 2 diabetes mellitus with foot ulcer 01/09/2015 Yes L89.623 Pressure ulcer of left heel, stage 3 01/09/2015 Yes C83.10 Mantle cell lymphoma, unspecified site 01/09/2015 Yes Z92.21 Personal  history of antineoplastic chemotherapy 01/09/2015 Yes Inactive Problems Resolved Problems Electronic Signature(s) Signed: 02/20/2015 12:42:46 PM By: Judene Companion MD Entered By: Judene Companion on 02/20/2015 11:45:41 ALYZZA, ANDRINGA ([id number]) -------------------------------------------------------------------------------- Progress Note Details Patient Name: [name]. Date of Service: 02/20/2015 10:45 AM Medical Record Number: [id number] Patient Account Number: [id number] Date of Birth/Sex: 1940/10/14 (75 y.o. Female) Treating RN: Carolyne Fiscal, Debi Primary Care Physician: SYSTEM, PCP Other Clinician: Referring Physician: Durenda Guthrie Treating Physician/Extender: Benjaman Pott in Treatment: 6 Subjective Chief Complaint Information obtained from Patient Patients presents for treatment of an open diabetic  ulcer and pressure ulcer to her left heel which she has had for 6 months. History of Present Illness (HPI) The following HPI elements were documented for the patient's wound: Location: ulceration of the left heel and calcaneal region Quality: Patient reports experiencing a dull pain to affected area(s). Severity: Patient states wound are getting worse. Duration: Patient has had the wound for >6 months prior to seeking treatment at the wound center Timing: Pain in wound is Intermittent (comes and goes Context: The wound appeared gradually over time Modifying Factors: Other treatment(s) tried include: Santyl ointment locally Associated Signs and Symptoms: Patient reports having difficulty standing for long periods. 75 year old female who comes to see [place] for a left heel pressure injury which she has had for 6 months. 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 She is status post stem cell transplant. as per her oncologist Dr. Grayland Ormond she has a PET scan to be done and may need a lymph node biopsy and a bone marrow biopsy in the near future. He is also status post abdominal hysterectomy, left toe amputation and eyes surgery. 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 discussed this result with the family at the bedside and recommended surgical debridement in the operating room so as to clean this out to the best of our ability. However the daughter informs me that a lot of other issues have to be addressed at the present time including a possible colon cancer and a recurrence of her lymphoma. They would like to defer surgical debridement of the heel to a later date 12/13/2015 -- patient has not been seen for about a month and during this time she has had a injury to her left ankle and may have a fracture and has been in a posterior slab. I understand she is getting her dressings done daily and has an orthopedic appointment this afternoon. She also has a possible colon cancer and this has not been investigated with a colonoscopy. [name] ([id number]) Objective Constitutional Vitals Time Taken: 11:10 AM, Height: 65 in, Weight: 185 lbs, BMI: 30.8, Temperature: 97.6 F, Pulse: 79 bpm, Respiratory Rate: 18 breaths/min, Blood Pressure: 159/84 mmHg. Integumentary (Hair, Skin) Wound #1 status is Open. Original cause of wound was Gradually Appeared. The wound is located on the Left,Distal Calcaneus. The wound measures 2cm length x 2cm width x 0.5cm depth; 3.142cm^2 area and 1.571cm^3 volume. The wound is limited to skin breakdown. There is no tunneling or undermining noted. There is a large amount of purulent drainage noted. The wound margin is thickened. There is no granulation within the  wound bed. There is a large (67-100%) amount of necrotic tissue within the wound bed including Adherent Slough. The periwound skin appearance exhibited: Localized Edema, Maceration, Moist. Periwound temperature was noted as No Abnormality. The periwound has tenderness on palpation. Wound #2 status is Open. Original cause of wound was Pressure Injury. The wound is located on the Left,Proximal Calcaneus. The wound measures 1.5cm length x 1.5cm width x 1cm depth; 1.767cm^2 area and 1.767cm^3 volume. The wound is limited to skin breakdown. There is no tunneling or undermining noted. There is a large amount of serous drainage noted. The wound margin is flat and  intact. There is no granulation within the wound bed. There is a large (67-100%) amount of necrotic tissue within the wound bed including Adherent Slough. The periwound skin appearance exhibited: Localized Edema, Maceration, Moist. Assessment Active Problems ICD-10 E11.621 - Type 2 diabetes mellitus with foot ulcer L89.623 - Pressure ulcer of left heel, stage 3 C83.10 - Mantle cell lymphoma, unspecified site Z92.21 - Personal history of antineoplastic chemotherapy left heel has 2cm stage 3 pressure ulcer. Being treated with Santyl daily. Has Podus boot to off load [name]. ([id number]) Plan Wound Cleansing: Wound #1 Left,Distal Calcaneus: Clean wound with Normal Saline. Cleanse wound with mild soap and water May Shower, gently pat wound dry prior to applying new dressing. Wound #2 Left,Proximal Calcaneus: Clean wound with Normal Saline. Cleanse wound with mild soap and water May Shower, gently pat wound dry prior to applying new dressing. Anesthetic: Wound #1 Left,Distal Calcaneus: Topical Lidocaine 4% cream applied to wound bed prior to debridement Wound #2 Left,Proximal Calcaneus: Topical Lidocaine 4% cream applied to wound bed prior to debridement Skin Barriers/Peri-Wound Care: Wound #1 Left,Distal Calcaneus: Barrier  cream Wound #2 Left,Proximal Calcaneus: Barrier cream Primary Wound Dressing: Wound #1 Left,Distal Calcaneus: Santyl Ointment Wound #2 Left,Proximal Calcaneus: Santyl Ointment Secondary Dressing: Wound #1 Left,Distal Calcaneus: Dry Gauze Boardered Foam Dressing - heel protector Foam Wound #2 Left,Proximal Calcaneus: Dry Gauze Boardered Foam Dressing - heel protector Foam Dressing Change Frequency: Wound #1 Left,Distal Calcaneus: Change dressing every day. - PLEASE CHANGE DAILY Wound #2 Left,Proximal Calcaneus: Change dressing every day. - PLEASE CHANGE DAILY Follow-up Appointments: Wound #1 Left,Distal Calcaneus: Return Appointment in 1 week. Wound #2 Left,Proximal Calcaneus: Return Appointment in 1 week. Off-Loading: Wound #1 Left,Distal Calcaneus: Other: - When lying in bed please float the heels Wound #2 Left,Proximal Calcaneus: [name] ([id number]) Other: - When lying in bed please float the heels Medications-please add to medication list.: Wound #1 Left,Distal Calcaneus: Other: - Please give pt Vitamin C. Zinc, and Multi Vitamins daily. Wound #2 Left,Proximal Calcaneus: Other: - Please give pt Vitamin C. Zinc, and Multi Vitamins daily. Follow-Up Appointments: A follow-up appointment should be scheduled. Medication Reconciliation completed and provided to Patient/Care Provider. A Patient Clinical Summary of Care was provided to [facility] Signature(s) Signed: 02/20/2015 12:42:06 PM By: Judene Companion MD Entered By: Judene Companion on 02/20/2015 12:42:06 [name] ([id number]) -------------------------------------------------------------------------------- SuperBill Details Patient Name: [name] Date of Service: 02/20/2015 Medical Record Number: [id number] Patient Account Number: [id number] Date of Birth/Sex: May 14, 1940 (75 y.o. Female) Treating RN: Carolyne Fiscal, Debi Primary Care Physician: SYSTEM, PCP Other Clinician: Referring Physician:  Durenda Guthrie Treating Physician/Extender: Benjaman Pott in Treatment: 6 Diagnosis Coding ICD-10 Codes Code Description E11.621 Type 2 diabetes mellitus with foot ulcer L89.623 Pressure ulcer of left heel, stage 3 C83.10 Mantle cell lymphoma, unspecified site Z92.21 Personal history of antineoplastic chemotherapy Facility Procedures CPT4 Code: [id number] Description: 99214 - WOUND CARE VISIT-LEV 4 EST PT Modifier: Quantity: 1 Physician Procedures CPT4 Code: 1950932 Description: 67124 - WC PHYS LEVEL 3 - EST PT ICD-10 Description Diagnosis L89.623 Pressure ulcer of left heel, stage 3 Modifier: Quantity: 1 Electronic Signature(s) Signed: 02/20/2015 4:59:51 PM By: Alric Quan Previous Signature: 02/20/2015 12:42:17 PM Version By: Judene Companion MD Entered By: Alric Quan on 02/20/2015 16:21:29

## 2015-02-25 DIAGNOSIS — Z949 Transplanted organ and tissue status, unspecified: Secondary | ICD-10-CM | POA: Insufficient documentation

## 2015-02-27 ENCOUNTER — Encounter: Payer: Medicare (Managed Care) | Admitting: Surgery

## 2015-02-27 DIAGNOSIS — E11621 Type 2 diabetes mellitus with foot ulcer: Secondary | ICD-10-CM | POA: Diagnosis not present

## 2015-02-28 NOTE — Progress Notes (Signed)
[name] ([id number]) Visit Report for 02/27/2015 Arrival Information Details Patient Name: [name], [name]. Date of Service: 02/27/2015 10:45 AM Medical Record Number: [id number] Patient Account Number: [id number] Date of Birth/Sex: [birth year] (75 y.o. Female) Treating RN: Carolyne Fiscal, Debi Primary Care Physician: SYSTEM, PCP Other Clinician: Referring Physician: Durenda Guthrie Treating Physician/Extender: Frann Rider in Treatment: 7 Visit Information History Since Last Visit All ordered tests and consults were completed: No Patient Arrived: Wheel Chair Added or deleted any medications: No Arrival Time: 11:01 Any new allergies or adverse reactions: No Accompanied By: daughter Had a fall or experienced change in No activities of daily living that may affect Transfer Assistance: Harrel Lemon Lift risk of falls: Patient Identification Verified: Yes Signs or symptoms of abuse/neglect since last No Secondary Verification Process Yes visito Completed: Hospitalized since last visit: No Patient Requires Transmission-Based No Pain Present Now: No Precautions: Patient Has Alerts: Yes Patient Alerts: DM II Electronic Signature(s) Signed: 02/27/2015 5:53:24 PM By: Alric Quan Entered By: Alric Quan on 02/27/2015 11:06:01 [name] ([id number]) -------------------------------------------------------------------------------- Encounter Discharge Information Details Patient Name: [name]. Date of Service: 02/27/2015 10:45 AM Medical Record Number: [id number] Patient Account Number: [id number] Date of Birth/Sex: May 07, 1940 (75 y.o. Female) Treating RN: Carolyne Fiscal, Debi Primary Care Physician: SYSTEM, PCP Other Clinician: Referring Physician: Durenda Guthrie Treating Physician/Extender: Frann Rider in Treatment: 7 Encounter Discharge Information Items Discharge Pain Level: 0 Discharge Condition: Stable Ambulatory Status: Wheelchair Discharge  Destination: Nursing Home Transportation: Private Auto Accompanied By: dtr Schedule Follow-up Appointment: Yes Medication Reconciliation completed and provided to Patient/Care No [place]: Provided on Clinical Summary of Care: 02/27/2015 Form Type Recipient Paper Patient FD Electronic Signature(s) Signed: 02/27/2015 11:57:25 AM By: Ruthine Dose Entered By: Ruthine Dose on 02/27/2015 11:57:24 Kerlin, Eppie Gibson ([id number]) -------------------------------------------------------------------------------- Lower Extremity Assessment Details Patient Name: [name]. Date of Service: 02/27/2015 10:45 AM Medical Record Number: [id number] Patient Account Number: [id number] Date of Birth/Sex: [birth year] (75 y.o. Female) Treating RN: Carolyne Fiscal, Debi Primary Care Physician: SYSTEM, PCP Other Clinician: Referring Physician: Durenda Guthrie Treating Physician/Extender: Frann Rider in Treatment: 7 Vascular Assessment Pulses: Posterior Tibial Extremity colors, hair growth, and conditions: Temperature of Extremity: [Left:Warm] Capillary Refill: [Left:< 3 seconds] Blanched when Elevated: [Left:No] Toe Nail Assessment Left: Right: Thick: Yes Discolored: Yes Deformed: No Improper Length and Hygiene: Yes Electronic Signature(s) Signed: 02/27/2015 5:53:24 PM By: Alric Quan Entered By: Alric Quan on 02/27/2015 11:50:02 Hebard, Eppie Gibson ([id number]) -------------------------------------------------------------------------------- Multi Wound Chart Details Patient Name: [name]. Date of Service: 02/27/2015 10:45 AM Medical Record Number: [id number] Patient Account Number: [id number] Date of Birth/Sex: [birth year] (75 y.o. 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] [N/A:N/A] Comorbid History: [1:Cataracts, Glaucoma, Sleep Apnea, Hypertension, Myocardial Hypertension, Myocardial Infarction, Type II Diabetes, History of pressure wounds, Rheumatoid Arthritis, Neuropathy, Received Chemotherapy, Received Chemotherapy, Received  Radiation] [2:Cataracts, Glaucoma, Sleep Apnea, Infarction, Type II Diabetes, History of pressure wounds, Rheumatoid Arthritis, Neuropathy, Received Radiation] [N/A:N/A] Date Acquired: [1:07/04/2014] [2:02/20/2015] [N/A:N/A] Weeks of Treatment: [1:7] [2:1] [N/A:N/A] Wound Status: [1:Open] [2:Open] [N/A:N/A] Measurements L x W x D 3x2x0.3 [2:2.5x2.5x1.5] [N/A:N/A] (cm) Area (  cm) : [1:4.712] [id number] [N/A:N/A] Volume (cm) : [1:1.414] [id number] [N/A:N/A] % Reduction in Area: [1:69.00%] [2:-177.80%] [N/A:N/A] % Reduction in Volume: 53.50% [2:-316.70%] [N/A:N/A] Classification: [1:Full Thickness Without Exposed Support Structures] [2:Category/Stage II] [N/A:N/A] HBO Classification: [1:Grade 2] [2:Grade 1] [N/A:N/A] Exudate Amount: [1:Large] [2:Large] [N/A:N/A] Exudate Type: [1:Serous] [2:Serous] [N/A:N/A] Exudate Color: [1:amber] [2:amber] [N/A:N/A] Foul Odor After Yes No N/A Cleansing: Odor Anticipated Due to No N/A N/A Product Use: Wound Margin: Thickened Flat and Intact N/A Granulation Amount: Medium (34-66%) None Present (0%) N/A Granulation Quality: Red, Pink N/A N/A Necrotic Amount: Medium (34-66%) Large (67-100%) N/A Exposed Structures: Fascia: No Fascia: No N/A Fat: No Fat: No Tendon: No Tendon: No Muscle: No Muscle:  No Joint: No Joint: No Bone: No Bone: No Limited to Skin Limited to Skin Breakdown Breakdown Epithelialization: None None N/A Periwound Skin Texture: Edema: Yes Edema: Yes N/A Periwound Skin Maceration: Yes Maceration: Yes N/A Moisture: Moist: Yes Moist: Yes Periwound Skin Color: No Abnormalities Noted No Abnormalities Noted N/A Temperature: No Abnormality N/A N/A Tenderness on Yes No N/A Palpation: Wound Preparation: Ulcer Cleansing: Ulcer Cleansing: N/A Rinsed/Irrigated with Rinsed/Irrigated with Saline Saline Topical Anesthetic Topical Anesthetic Applied: Other: lidocaine Applied: Other: lidocaine 4% 4% Treatment Notes Electronic Signature(s) Signed: 02/27/2015 5:53:24 PM By: Alric Quan Entered By: Alric Quan on 02/27/2015 11:23:22 ROYALE, ALFRED ([id number]) -------------------------------------------------------------------------------- [place] Details Patient Name: STEPHANIE, SOTELLO. Date of Service: 02/27/2015 10:45 AM Medical Record Number: [id number] Patient Account Number: [id number] Date of Birth/Sex: February 22, 1940 (75 y.o. Female) Treating RN: Carolyne Fiscal, Debi Primary Care Physician: SYSTEM, PCP Other Clinician: Referring Physician: Durenda Guthrie Treating Physician/Extender: Frann Rider in Treatment: 7 Active Inactive Abuse / Safety / Falls / Self Care Management Nursing Diagnoses: Knowledge deficit related to: safety; personal, health (wound), emergency Potential for falls Goals: Patient will remain injury free Date Initiated: 01/09/2015 Goal Status: Active Patient/caregiver will verbalize understanding of skin care regimen Date Initiated: 01/09/2015 Goal Status: Active Patient/caregiver will verbalize/demonstrate measures taken to improve the patient's personal safety Date Initiated: 01/09/2015 Goal Status: Active Interventions: Assess fall risk on admission and as needed Assess: immobility, friction, shearing,  incontinence upon admission and as needed Notes: Nutrition Nursing Diagnoses: Imbalanced nutrition Goals: Patient/caregiver agrees to and verbalizes understanding of need to use nutritional supplements and/or vitamins as prescribed Date Initiated: 01/09/2015 Goal Status: Active Patient/caregiver will maintain therapeutic glucose control Date Initiated: 01/09/2015 Goal Status: Active NEL, HAVNER ([id number]) Interventions: Assess patient nutrition upon admission and as needed per policy Provide education on elevated blood sugars and impact on wound healing Notes: Orientation to the Wound Care Program Nursing Diagnoses: Knowledge deficit related to the wound healing center program Goals: Patient/caregiver will verbalize understanding of the [place] Program Date Initiated: 01/09/2015 Goal Status: Active Interventions: Provide education on orientation to the wound center Notes: Pain, Acute or Chronic Nursing Diagnoses: Pain Management - Non-cyclic Acute (Procedural) Pain Management - Non-cyclic Chronic Pain Goals: Patient will verbalize adequate pain control and receive pain control interventions during procedures as needed Date Initiated: 01/09/2015 Goal Status: Active Patient/caregiver will verbalize adequate pain control between visits Date Initiated: 01/09/2015 Goal Status: Active Interventions: Assess comfort goal upon admission Complete pain assessment as per visit requirements Notes: Pressure Nursing Diagnoses: Knowledge deficit related to management of pressures ulcers RISHIKA, ORO ([id number]) Goals: Patient will remain free from development of additional pressure ulcers Date Initiated: 01/09/2015 Goal Status: Active Interventions: Assess: immobility, friction, shearing, incontinence upon admission and as needed Assess offloading mechanisms upon admission and  as needed Notes: Wound/Skin Impairment Nursing Diagnoses: Impaired tissue  integrity Goals: Patient/caregiver will verbalize understanding of skin care regimen Date Initiated: 01/09/2015 Goal Status: Active Ulcer/skin breakdown will have a volume reduction of 30% by week 4 Date Initiated: 01/09/2015 Goal Status: Active Ulcer/skin breakdown will have a volume reduction of 50% by week 8 Date Initiated: 01/09/2015 Goal Status: Active Ulcer/skin breakdown will have a volume reduction of 80% by week 12 Date Initiated: 01/09/2015 Goal Status: Active Interventions: Assess patient/caregiver ability to perform ulcer/skin care regimen upon admission and as needed Assess ulceration(s) every visit Notes: Electronic Signature(s) Signed: 02/27/2015 5:53:24 PM By: Alric Quan Entered By: Alric Quan on 02/27/2015 11:23:16 [name] ([id number]) -------------------------------------------------------------------------------- Pain Assessment Details Patient Name: [name]. Date of Service: 02/27/2015 10:45 AM Medical Record Number: [id number] Patient Account Number: [id number] Date of Birth/Sex: October 07, 1940 (75 y.o. Female) Treating RN: Carolyne Fiscal, Debi Primary Care Physician: SYSTEM, PCP Other Clinician: Referring Physician: Durenda Guthrie Treating Physician/Extender: Frann Rider in Treatment: 7 Active Problems Location of Pain Severity and Description of Pain Patient Has Paino No Site Locations Pain Management and Medication Current Pain Management: Electronic Signature(s) Signed: 02/27/2015 5:53:24 PM By: Alric Quan Entered By: Alric Quan on 02/27/2015 [place], [place]. ([id number]) -------------------------------------------------------------------------------- Patient/Caregiver Education Details Patient Name: [name] Date of Service: 02/27/2015 10:45 AM Medical Record Number: [id number] Patient Account Number: [id number] Date of Birth/Gender: 05-07-1940 (74 y.o. Female) Treating RN: Carolyne Fiscal, Debi Primary  Care Physician: SYSTEM, PCP Other Clinician: Referring Physician: Durenda Guthrie Treating Physician/Extender: Frann Rider in Treatment: 7 Education Assessment Education Provided To: Patient and Caregiver Education Topics Provided Wound/Skin Impairment: Handouts: Other: wound care as ordered Methods: Demonstration, Explain/Verbal, Printed Responses: State content correctly Electronic Signature(s) Signed: 02/27/2015 5:53:24 PM By: Alric Quan Entered By: Alric Quan on 02/27/2015 11:43:29 Yawn, Eppie Gibson ([id number]) -------------------------------------------------------------------------------- Wound Assessment Details Patient Name: [name]. Date of Service: 02/27/2015 10:45 AM Medical Record Number: [id number] Patient Account Number: [id number] Date of Birth/Sex: 1940-09-07 (75 y.o. Female) Treating RN: Carolyne Fiscal, Debi Primary Care Physician: SYSTEM, PCP Other Clinician: Referring Physician: Durenda Guthrie Treating Physician/Extender: Frann Rider in Treatment: 7 Wound Status Wound Number: 1 Primary Arterial Insufficiency Ulcer Etiology: Wound Location: Left Calcaneus - Distal Wound Open Wounding Event: Gradually Appeared Status: Date Acquired: 07/04/2014 Comorbid Cataracts, Glaucoma, Sleep Apnea, Weeks Of Treatment: 7 History: Hypertension, Myocardial Infarction, Clustered Wound: No Type II Diabetes, History of pressure wounds, Rheumatoid Arthritis, Neuropathy, Received Chemotherapy, Received Radiation Photos Photo Uploaded By: Alric Quan on 02/27/2015 17:46:34 Wound Measurements Length: (cm) 3 Width: (cm) 2 Depth: (cm) 0.3 Area: (cm) 4.712 Volume: (cm) 1.414 % Reduction in Area: 69% % Reduction in Volume: 53.5% Epithelialization: None Tunneling: Yes Position (o'clock): 1 Maximum Distance: (cm) 1.6 Undermining: No Wound Description Full Thickness Without Foul Odor Aft Classification: Exposed Support Structures Due  to [place] Diabetic Severity Grade 2 (Wagner): Wound Margin: Thickened LYNLEE, TROM ([id number]) er Cleansing: Yes t Use: No Exudate Amount: Large Exudate Type: Serous Exudate Color: amber Wound Bed Granulation Amount: Medium (34-66%) Exposed Structure Granulation Quality: Red, Pink Fascia Exposed: No Necrotic Amount: Medium (34-66%) Fat Layer Exposed: No Necrotic Quality: Adherent Slough Tendon Exposed: No Muscle Exposed: No Joint Exposed: No Bone Exposed: No Limited to Skin Breakdown Periwound Skin Texture Texture Color No Abnormalities Noted: No No Abnormalities Noted: No Localized Edema: Yes Temperature / Pain Moisture Temperature: No Abnormality No Abnormalities Noted: No Tenderness on Palpation: Yes Maceration: Yes Moist: Yes Wound Preparation Ulcer Cleansing: Rinsed/Irrigated  with Saline Topical Anesthetic Applied: Other: lidocaine 4%, Treatment Notes Wound #1 (Left, Distal Calcaneus) 1. Cleansed with: Clean wound with Normal Saline 2. Anesthetic Topical Lidocaine 4% cream to wound bed prior to debridement 4. Dressing Applied: Santyl Ointment Plain packing gauze 5. Secondary Dressing Applied Guaze, ABD and kerlix/Conform 7. Secured with Recruitment consultant) Signed: 02/27/2015 5:53:24 PM By: Alric Quan Entered By: Alric Quan on 02/27/2015 11:39:46 NATURE, MILLET ([id number]) -------------------------------------------------------------------------------- Wound Assessment Details Patient Name: LUCY, LITTY. Date of Service: 02/27/2015 10:45 AM Medical Record Number: [id number] Patient Account Number: [id number] Date of Birth/Sex: 10-05-1940 (75 y.o. Female) Treating RN: Carolyne Fiscal, Debi Primary Care Physician: SYSTEM, PCP Other Clinician: Referring Physician: Durenda Guthrie Treating Physician/Extender: Frann Rider in Treatment: 7 Wound Status Wound Number: 2 Primary Pressure Ulcer Etiology: Wound Location: Left  Calcaneus - Proximal Wound Open Wounding Event: Pressure Injury Status: Date Acquired: 02/20/2015 Comorbid Cataracts, Glaucoma, Sleep Apnea, Weeks Of Treatment: 1 History: Hypertension, Myocardial Infarction, Clustered Wound: No Type II Diabetes, History of pressure wounds, Rheumatoid Arthritis, Neuropathy, Received Chemotherapy, Received Radiation Photos Photo Uploaded By: Alric Quan on 02/27/2015 17:46:34 Wound Measurements Length: (cm) 2.5 Width: (cm) 2.5 Depth: (cm) 1.5 Area: (cm) 4.909 Volume: (cm) 7.363 % Reduction in Area: -177.8% % Reduction in Volume: -316.7% Epithelialization: None Tunneling: No Undermining: No Wound Description Classification: Category/Stage II Foul Odor Aft Diabetic Severity (Wagner): Grade 1 Wound Margin: Flat and Intact Exudate Amount: Large Exudate Type: Serous Exudate Color: amber er Cleansing: No Wound Bed SHIVANGI, BODDICKER ([id number]) Granulation Amount: None Present (0%) Exposed Structure Necrotic Amount: Large (67-100%) Fascia Exposed: No Necrotic Quality: Adherent Slough Fat Layer Exposed: No Tendon Exposed: No Muscle Exposed: No Joint Exposed: No Bone Exposed: No Limited to Skin Breakdown Periwound Skin Texture Texture Color No Abnormalities Noted: No No Abnormalities Noted: No Localized Edema: Yes Moisture No Abnormalities Noted: No Maceration: Yes Moist: Yes Wound Preparation Ulcer Cleansing: Rinsed/Irrigated with Saline Topical Anesthetic Applied: Other: lidocaine 4%, Treatment Notes Wound #2 (Left, Proximal Calcaneus) 1. Cleansed with: Clean wound with Normal Saline 2. Anesthetic Topical Lidocaine 4% cream to wound bed prior to debridement 4. Dressing Applied: Santyl Ointment Plain packing gauze 5. Secondary Dressing Applied Guaze, ABD and kerlix/Conform 7. Secured with Recruitment consultant) Signed: 02/27/2015 5:53:24 PM By: Alric Quan Entered By: Alric Quan on 02/27/2015  11:20:05 GIRLIE, AUEN ([id number]) -------------------------------------------------------------------------------- Vitals Details Patient Name: [name] Date of Service: 02/27/2015 10:45 AM Medical Record Number: [id number] Patient Account Number: [id number] Date of Birth/Sex: 10/23/1940 (74 y.o. Female) Treating RN: Carolyne Fiscal, Debi Primary Care Physician: SYSTEM, PCP Other Clinician: Referring Physician: Durenda Guthrie Treating Physician/Extender: Frann Rider in Treatment: 7 Vital Signs Time Taken: 11:06 Temperature (F): 98.4 Height (in): 65 Pulse (bpm): 93 Weight (lbs): 185 Respiratory Rate (breaths/min): 18 Body Mass Index (BMI): 30.8 Blood Pressure (mmHg): 127/89 Reference Range: 80 - 120 mg / dl Electronic Signature(s) Signed: 02/27/2015 5:53:24 PM By: Alric Quan Entered By: Alric Quan on 02/27/2015 11:06:38

## 2015-02-28 NOTE — Progress Notes (Signed)
KELIS, PLASSE ([id number]) Visit Report for 02/27/2015 Chief Complaint Document Details Patient Name: [name], [name]. Date of Service: 02/27/2015 10:45 AM Medical Record Number: [id number] Patient Account Number: [id number] Date of Birth/Sex: 1940-12-23 (75 y.o. Female) Treating RN: Carolyne Fiscal, Debi Primary Care Physician: SYSTEM, PCP Other Clinician: Referring Physician: Durenda Guthrie Treating Physician/Extender: Frann Rider in Treatment: 7 Information Obtained from: Patient Chief Complaint Patients presents for treatment of an open diabetic ulcer and pressure ulcer to her left heel which she has had for 6 months. Electronic Signature(s) Signed: 02/27/2015 11:39:38 AM By: Christin Fudge MD, FACS Entered By: Christin Fudge on 02/27/2015 11:39:38 ELLEIGH, CASSETTA ([id number]) -------------------------------------------------------------------------------- Debridement Details Patient Name: MAILEY, LANDSTROM. Date of Service: 02/27/2015 10:45 AM Medical Record Number: [id number] Patient Account Number: [id number] Date of Birth/Sex: 1940/12/28 (75 y.o. Female) Treating RN: Carolyne Fiscal, Debi Primary Care Physician: SYSTEM, PCP Other Clinician: Referring Physician: Durenda Guthrie Treating Physician/Extender: Frann Rider in Treatment: 7 Debridement Performed for Wound #1 Left,Distal Calcaneus Assessment: Performed By: Physician Christin Fudge, MD Debridement: Debridement Pre-procedure Yes Verification/Time Out Taken: Start Time: 11:32 Pain Control: Other : lidocaine 4% cream Level: Skin/Subcutaneous Tissue Total Area Debrided (L x 3 (cm) x 2 (cm) = 6 (cm) W): Tissue and other Viable, Non-Viable, Exudate, Fibrin/Slough, Subcutaneous material debrided: Instrument: Forceps, Scissors Bleeding: Minimum Hemostasis Achieved: Pressure End Time: 11:35 Procedural Pain: 0 Post Procedural Pain: 0 Response to Treatment: Procedure was tolerated well Post Debridement  Measurements of Total Wound Length: (cm) 3 Width: (cm) 2 Depth: (cm) 0.3 Volume: (cm) 1.414 Post Procedure Diagnosis Same as Pre-procedure Electronic Signature(s) Signed: 02/27/2015 11:39:22 AM By: Christin Fudge MD, FACS Signed: 02/27/2015 5:53:24 PM By: Alric Quan Entered By: Christin Fudge on 02/27/2015 11:39:22 KASSANDRA, MERIWEATHER ([id number]) -------------------------------------------------------------------------------- Debridement Details Patient Name: [name]. Date of Service: 02/27/2015 10:45 AM Medical Record Number: [id number] Patient Account Number: [id number] Date of Birth/Sex: May 30, 1940 (75 y.o. Female) Treating RN: Carolyne Fiscal, Debi Primary Care Physician: SYSTEM, PCP Other Clinician: Referring Physician: Durenda Guthrie Treating Physician/Extender: Frann Rider in Treatment: 7 Debridement Performed for Wound #2 Left,Proximal Calcaneus Assessment: Performed By: Physician Christin Fudge, MD Debridement: Debridement Pre-procedure Yes Verification/Time Out Taken: Start Time: 11:32 Pain Control: Other : lidocaine 4% cream Level: Skin/Subcutaneous Tissue Total Area Debrided (L x 2.5 (cm) x 2.5 (cm) = 6.25 (cm) W): Tissue and other Viable, Non-Viable, Exudate, Fibrin/Slough, Subcutaneous material debrided: Instrument: Forceps, Scissors Bleeding: Minimum Hemostasis Achieved: Pressure End Time: 11:35 Procedural Pain: 0 Post Procedural Pain: 0 Response to Treatment: Procedure was tolerated well Post Debridement Measurements of Total Wound Length: (cm) 2.5 Stage: Category/Stage II Width: (cm) 2.5 Depth: (cm) 1.5 Volume: (cm) 7.363 Post Procedure Diagnosis Same as Pre-procedure Electronic Signature(s) Signed: 02/27/2015 11:39:29 AM By: Christin Fudge MD, FACS Signed: 02/27/2015 5:53:24 PM By: Alric Quan Entered By: Christin Fudge on 02/27/2015 11:39:29 KAWANDA, DRUMHELLER  ([id number]) -------------------------------------------------------------------------------- HPI Details Patient Name: [name]. Date of Service: 02/27/2015 10:45 AM Medical Record Number: [id number] Patient Account Number: [id number] Date of Birth/Sex: 1940/09/07 (75 y.o. Female) Treating RN: Carolyne Fiscal, Debi Primary Care Physician: SYSTEM, PCP Other Clinician: Referring Physician: Durenda Guthrie Treating Physician/Extender: Frann Rider in Treatment: 7 History of Present Illness Location: ulceration of the left heel and calcaneal region Quality: Patient reports experiencing a dull pain to affected area(s). Severity: Patient states wound are getting worse. Duration: Patient has had the wound for >6 months prior to seeking treatment at the wound center Timing: Pain in wound is Intermittent (comes and  goes Context: The wound appeared gradually over time Modifying Factors: Other treatment(s) tried include: Santyl ointment locally Associated Signs and Symptoms: Patient reports having difficulty standing for long periods. HPI Description: 75 year old female who comes to see [place] for a left heel pressure injury which she has had for 6 months. 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 She is status post stem cell transplant. as per her oncologist Dr. Grayland Ormond she has a PET scan to be done and may need a lymph node biopsy and a bone marrow biopsy in the near future. He is also status post abdominal hysterectomy, left toe amputation and eyes surgery. 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 discussed this result  with the family at the bedside and recommended surgical debridement in the operating room so as to clean this out to the best of our ability. However the daughter informs me that a lot of other issues have to be addressed at the present time including a possible colon cancer and a recurrence of her lymphoma. They would like to defer surgical debridement of the heel to a later date 02/13/2015 -- patient has not been seen for about a month and during this time she has had a injury to her left ankle and may have a fracture and has been in a posterior slab. I understand she is getting her dressings done daily and has an orthopedic appointment this afternoon. She also has a possible colon cancer and this has not been investigated with a colonoscopy. 02/27/2015 -- he was seen by the orthopedic doctor and was told that they would not be any surgical intervention and she would continue to be non- weightbearing. Electronic Signature(s) Signed: 02/27/2015 11:40:54 AM By: Christin Fudge MD, FACS Entered By: Christin Fudge on 02/27/2015 11:40:53 LATREECE, MOCHIZUKI ([id number]) -------------------------------------------------------------------------------- Physical Exam Details Patient Name: NYSSA, SAYEGH. Date of Service: 02/27/2015 10:45 AM Medical Record Number: [id number] Patient Account Number: [id number] Date of Birth/Sex: [birth year] (75 y.o. Female) Treating RN: Carolyne Fiscal, Debi Primary Care Physician: SYSTEM, PCP Other Clinician: Referring Physician: Durenda Guthrie Treating Physician/Extender: Frann Rider in Treatment: 7 Constitutional . Pulse regular. Respirations normal and unlabored. Afebrile. . Eyes Nonicteric. Reactive to light. Ears, Nose, Mouth, and Throat Lips, teeth, and gums WNL.Marland Kitchen Moist mucosa without lesions. Neck supple and nontender. No palpable supraclavicular or cervical adenopathy. Normal sized without goiter. Respiratory WNL. No retractions.. Cardiovascular Pedal  Pulses WNL. No clubbing, cyanosis or edema. Gastrointestinal (GI) Abdomen without masses or tenderness.. No liver or spleen enlargement or tenderness.. Lymphatic No adneopathy. No adenopathy. No adenopathy. Musculoskeletal Adexa without tenderness or enlargement.. Digits and nails w/o clubbing, cyanosis, infection, petechiae, ischemia, or inflammatory conditions.. Integumentary (Hair, Skin) No suspicious lesions. No crepitus or fluctuance. No peri-wound warmth or erythema. No masses.Marland Kitchen Psychiatric Judgement and insight Intact.. No evidence of depression, anxiety, or agitation.. Notes the left heel has quite a bit of debris which needed to be sharply dissected with forceps and scissors and some of the bleeding was controlled with Silver nitrate. the new area near her Achilles tendon is now quite deep and she has significant amount of necrotic debris which was sharply dissected with a forcep and scissors. Electronic Signature(s) Signed: 02/27/2015 11:42:11  AM By: Christin Fudge MD, FACS Entered By: Christin Fudge on 02/27/2015 11:42:10 GEORGANN, BRAMBLE ([id number]) -------------------------------------------------------------------------------- Physician Orders Details Patient Name: JEANEE, FABRE Date of Service: 02/27/2015 10:45 AM Medical Record Number: [id number] Patient Account Number: [id number] Date of Birth/Sex: 1940-07-12 (75 y.o. Female) Treating RN: Carolyne Fiscal, Debi Primary Care Physician: SYSTEM, PCP Other Clinician: Referring Physician: Durenda Guthrie Treating Physician/Extender: Frann Rider in Treatment: 7 Verbal / Phone Orders: Yes Clinician: Carolyne Fiscal, Debi Read Back and Verified: Yes Diagnosis Coding ICD-10 Coding Code Description E11.621 Type 2 diabetes mellitus with foot ulcer L89.623 Pressure ulcer of left heel, stage 3 C83.10 Mantle cell lymphoma, unspecified site Z92.21 Personal history of antineoplastic chemotherapy Wound Cleansing Wound #1  Left,Distal Calcaneus o Clean wound with Normal Saline. o Cleanse wound with mild soap and water o May Shower, gently pat wound dry prior to applying new dressing. Wound #2 Left,Proximal Calcaneus o Clean wound with Normal Saline. o Cleanse wound with mild soap and water o May Shower, gently pat wound dry prior to applying new dressing. Anesthetic Wound #1 Left,Distal Calcaneus o Topical Lidocaine 4% cream applied to wound bed prior to debridement Wound #2 Left,Proximal Calcaneus o Topical Lidocaine 4% cream applied to wound bed prior to debridement Skin Barriers/Peri-Wound Care Wound #1 Left,Distal Calcaneus o Barrier cream Wound #2 Left,Proximal Calcaneus o Barrier cream Primary Wound Dressing Wound #1 Left,Distal Calcaneus o Santyl Ointment o Plain packing gauze MORAYO, LEVEN ([id number]) Wound #2 Left,Proximal Calcaneus o Santyl Ointment o Plain packing gauze Secondary Dressing Wound #1 Left,Distal Calcaneus o Gauze, ABD and Kerlix/Conform Wound #2 Left,Proximal Calcaneus o Gauze, ABD and Kerlix/Conform Dressing Change Frequency Wound #1 Left,Distal Calcaneus o Change dressing every day. - PLEASE CHANGE DAILY Wound #2 Left,Proximal Calcaneus o Change dressing every day. - PLEASE CHANGE DAILY Follow-up Appointments Wound #1 Left,Distal Calcaneus o Return Appointment in 1 week. Wound #2 Left,Proximal Calcaneus o Return Appointment in 1 week. Off-Loading Wound #1 Left,Distal Calcaneus o Other: - When lying in bed please float the heels Wound #2 Left,Proximal Calcaneus o Other: - When lying in bed please float the heels Medications-please add to medication list. Wound #1 Left,Distal Calcaneus o Santyl Enzymatic Ointment o Other: - Please give pt Vitamin C. Zinc, and Multi Vitamins daily. Wound #2 Left,Proximal Calcaneus o Santyl Enzymatic Ointment o Other: - Please give pt Vitamin C. Zinc, and Multi Vitamins  daily. Electronic Signature(s) Signed: 02/27/2015 4:56:18 PM By: Christin Fudge MD, FACS Signed: 02/27/2015 5:53:24 PM By: Jacob Moores ([id number]) Entered By: Alric Quan on 02/27/2015 11:41:33 WALKER, PADDACK ([id number]) -------------------------------------------------------------------------------- Problem List Details Patient Name: KYRSTEN, DELEEUW. Date of Service: 02/27/2015 10:45 AM Medical Record Number: [id number] Patient Account Number: [id number] Date of Birth/Sex: Sep 15, 1940 (75 y.o. Female) Treating RN: Carolyne Fiscal, Debi Primary Care Physician: SYSTEM, PCP Other Clinician: Referring Physician: Durenda Guthrie Treating Physician/Extender: Frann Rider in Treatment: 7 Active Problems ICD-10 Encounter Code Description Active Date Diagnosis E11.621 Type 2 diabetes mellitus with foot ulcer 01/09/2015 Yes L89.623 Pressure ulcer of left heel, stage 3 01/09/2015 Yes C83.10 Mantle cell lymphoma, unspecified site 01/09/2015 Yes Z92.21 Personal history of antineoplastic chemotherapy 01/09/2015 Yes Inactive Problems Resolved Problems Electronic Signature(s) Signed: 02/27/2015 11:39:12 AM By: Christin Fudge MD, FACS Entered By: Christin Fudge on 02/27/2015 11:39:12 NAVIL, KOLE ([id number]) -------------------------------------------------------------------------------- Progress Note Details Patient Name: [name]. Date of Service: 02/27/2015 10:45 AM Medical Record Number: [id number] Patient Account Number: [id number] Date of Birth/Sex: 23-Feb-1940 (75 y.o. Female) Treating RN: Carolyne Fiscal, Debi  Primary Care Physician: SYSTEM, PCP Other Clinician: Referring Physician: Durenda Guthrie Treating Physician/Extender: Frann Rider in Treatment: 7 Subjective Chief Complaint Information obtained from Patient Patients presents for treatment of an open diabetic ulcer and pressure ulcer to her left heel which she has had for 6 months. History of  Present Illness (HPI) The following HPI elements were documented for the patient's wound: Location: ulceration of the left heel and calcaneal region Quality: Patient reports experiencing a dull pain to affected area(s). Severity: Patient states wound are getting worse. Duration: Patient has had the wound for >6 months prior to seeking treatment at the wound center Timing: Pain in wound is Intermittent (comes and goes Context: The wound appeared gradually over time Modifying Factors: Other treatment(s) tried include: Santyl ointment locally Associated Signs and Symptoms: Patient reports having difficulty standing for long periods. 75 year old female who comes to see [place] for a left heel pressure injury which she has had for 6 months. 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 She is status post stem cell transplant. as per her oncologist Dr. Grayland Ormond she has a PET scan to be done and may need a lymph node biopsy and a bone marrow biopsy in the near future. He is also status post abdominal hysterectomy, left toe amputation and eyes surgery. 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 discussed this result with the family at the bedside and recommended surgical debridement in the operating room so as to clean this out to the best of our ability. However the daughter informs me that a lot of other issues have to be addressed at the present time including a possible colon cancer and a recurrence of her lymphoma. They would like to defer surgical debridement of the heel to a later date 02/13/2015 -- patient has not been seen for  about a month and during this time she has had a injury to her left ankle and may have a fracture and has been in a posterior slab. I understand she is getting her dressings done daily and has an orthopedic appointment this afternoon. She also has a possible colon cancer and this has not been investigated with a colonoscopy. 02/27/2015 -- he was seen by the orthopedic doctor and was told that they would not be any surgical RITAJ, DULLEA. ([id number]) intervention and she would continue to be non- weightbearing. Objective Constitutional Pulse regular. Respirations normal and unlabored. Afebrile. Vitals Time Taken: 11:06 AM, Height: 65 in, Weight: 185 lbs, BMI: 30.8, Temperature: 98.4 F, Pulse: 93 bpm, Respiratory Rate: 18 breaths/min, Blood Pressure: 127/89 mmHg. Eyes Nonicteric. Reactive to light. Ears, Nose, Mouth, and Throat Lips, teeth, and gums WNL.Marland Kitchen Moist mucosa without lesions. Neck supple and nontender. No palpable supraclavicular or cervical adenopathy. Normal sized without goiter. Respiratory WNL. No retractions.. Cardiovascular Pedal Pulses WNL. No clubbing, cyanosis or edema. Gastrointestinal (GI) Abdomen without masses or tenderness.. No liver or spleen enlargement or tenderness.. Lymphatic No adneopathy. No adenopathy. No adenopathy. Musculoskeletal Adexa without tenderness or enlargement.. Digits and nails w/o clubbing, cyanosis, infection, petechiae, ischemia, or inflammatory conditions.Marland Kitchen Psychiatric Judgement and insight Intact.. No evidence of depression, anxiety, or agitation.. General Notes: the left heel has quite a bit of debris which  needed to be sharply dissected with forceps and scissors and some of the bleeding was controlled with Silver nitrate. the new area near her Achilles tendon is now quite deep and she has significant amount of necrotic debris which was sharply dissected with a forcep and scissors. DEAISHA, WELBORN ([id number]) Integumentary  (Hair, Skin) No suspicious lesions. No crepitus or fluctuance. No peri-wound warmth or erythema. No masses.. Wound #1 status is Open. Original cause of wound was Gradually Appeared. The wound is located on the Left,Distal Calcaneus. The wound measures 3cm length x 2cm width x 0.3cm depth; 4.712cm^2 area and 1.414cm^3 volume. The wound is limited to skin breakdown. There is no undermining noted, however, there is tunneling at 1:00 with a maximum distance of 1.6cm. There is a large amount of serous drainage noted. The wound margin is thickened. There is medium (34-66%) red, pink granulation within the wound bed. There is a medium (34-66%) amount of necrotic tissue within the wound bed including Adherent Slough. The periwound skin appearance exhibited: Localized Edema, Maceration, Moist. Periwound temperature was noted as No Abnormality. The periwound has tenderness on palpation. Wound #2 status is Open. Original cause of wound was Pressure Injury. The wound is located on the Left,Proximal Calcaneus. The wound measures 2.5cm length x 2.5cm width x 1.5cm depth; 4.909cm^2 area and 7.363cm^3 volume. The wound is limited to skin breakdown. There is no tunneling or undermining noted. There is a large amount of serous drainage noted. The wound margin is flat and intact. There is no granulation within the wound bed. There is a large (67-100%) amount of necrotic tissue within the wound bed including Adherent Slough. The periwound skin appearance exhibited: Localized Edema, Maceration, Moist. Assessment Active Problems ICD-10 E11.621 - Type 2 diabetes mellitus with foot ulcer L89.623 - Pressure ulcer of left heel, stage 3 C83.10 - Mantle cell lymphoma, unspecified site Z92.21 - Personal history of antineoplastic chemotherapy This patient has several medical issues and I have been unable to get her to the operating room for a surgical debridement and have not been able to get her ABI scheduled because of  various other impending appointments. She saw the orthopedic doctor recently and there is no plan to do any procedure under anesthesia. The fact is it may be worth debriding the ankle and heel in the OR, but there are other reluctant to do this. In the meanwhile we will continue with Santyl ointment locally, and we will use half an inch packing gauze into the depth of the wound. I have clearly instructed that the daughter that if this gets worse, drains purulent material or there is evidence of cellulitis she should take the mother directly to the OR.Marland Kitchen DANITA, PROUD ([id number]) She will come back and see me next week if possible Procedures Wound #1 Wound #1 is an Arterial Insufficiency Ulcer located on the Left,Distal Calcaneus . There was a Skin/Subcutaneous Tissue Debridement ([id number]) debridement with total area of 6 sq cm performed by Christin Fudge, MD. with the following instrument(s): Forceps and Scissors to remove Viable and Non- Viable tissue/material including Exudate, Fibrin/Slough, and Subcutaneous after achieving pain control using Other (lidocaine 4% cream). A time out was conducted prior to the start of the procedure. A Minimum amount of bleeding was controlled with Pressure. The procedure was tolerated well with a pain level of 0 throughout and a pain level of 0 following the procedure. Post Debridement Measurements: 3cm length x 2cm width x 0.3cm depth; 1.414cm^3 volume. Post procedure Diagnosis Wound #1:  Same as Pre-Procedure Wound #2 Wound #2 is a Pressure Ulcer located on the Left,Proximal Calcaneus . There was a Skin/Subcutaneous Tissue Debridement ([id number]) debridement with total area of 6.25 sq cm performed by Christin Fudge, MD. with the following instrument(s): Forceps and Scissors to remove Viable and Non-Viable tissue/material including Exudate, Fibrin/Slough, and Subcutaneous after achieving pain control using Other (lidocaine 4% cream). A time out was  conducted prior to the start of the procedure. A Minimum amount of bleeding was controlled with Pressure. The procedure was tolerated well with a pain level of 0 throughout and a pain level of 0 following the procedure. Post Debridement Measurements: 2.5cm length x 2.5cm width x 1.5cm depth; 7.363cm^3 volume. Post debridement Stage noted as Category/Stage II. Post procedure Diagnosis Wound #2: Same as Pre-Procedure Plan Wound Cleansing: Wound #1 Left,Distal Calcaneus: Clean wound with Normal Saline. Cleanse wound with mild soap and water May Shower, gently pat wound dry prior to applying new dressing. Wound #2 Left,Proximal Calcaneus: Clean wound with Normal Saline. Cleanse wound with mild soap and water May Shower, gently pat wound dry prior to applying new dressing. Anesthetic: Wound #1 Left,Distal Calcaneus: Topical Lidocaine 4% cream applied to wound bed prior to debridement Wound #2 Left,Proximal Calcaneus: Topical Lidocaine 4% cream applied to wound bed prior to debridement Skin Barriers/Peri-Wound Care: SADIYAH, KANGAS ([id number]) Wound #1 Left,Distal Calcaneus: Barrier cream Wound #2 Left,Proximal Calcaneus: Barrier cream Primary Wound Dressing: Wound #1 Left,Distal Calcaneus: Santyl Ointment Plain packing gauze Wound #2 Left,Proximal Calcaneus: Santyl Ointment Plain packing gauze Secondary Dressing: Wound #1 Left,Distal Calcaneus: Gauze, ABD and Kerlix/Conform Wound #2 Left,Proximal Calcaneus: Gauze, ABD and Kerlix/Conform Dressing Change Frequency: Wound #1 Left,Distal Calcaneus: Change dressing every day. - PLEASE CHANGE DAILY Wound #2 Left,Proximal Calcaneus: Change dressing every day. - PLEASE CHANGE DAILY Follow-up Appointments: Wound #1 Left,Distal Calcaneus: Return Appointment in 1 week. Wound #2 Left,Proximal Calcaneus: Return Appointment in 1 week. Off-Loading: Wound #1 Left,Distal Calcaneus: Other: - When lying in bed please float the  heels Wound #2 Left,Proximal Calcaneus: Other: - When lying in bed please float the heels Medications-please add to medication list.: Wound #1 Left,Distal Calcaneus: Santyl Enzymatic Ointment Other: - Please give pt Vitamin C. Zinc, and Multi Vitamins daily. Wound #2 Left,Proximal Calcaneus: Santyl Enzymatic Ointment Other: - Please give pt Vitamin C. Zinc, and Multi Vitamins daily. This patient has several medical issues and I have been unable to get her to the operating room for a surgical debridement and have not been able to get her ABI scheduled because of various other impending appointments. She saw the orthopedic doctor recently and there is no plan to do any procedure under anesthesia. The fact is it may be worth debriding the ankle and heel in the OR, but there are other reluctant to do this. In the VERSIA, MIGNOGNA ([id number]) meanwhile we will continue with Santyl ointment locally, and we will use half an inch packing gauze into the depth of the wound. I have clearly instructed that the daughter that if this gets worse, drains purulent material or there is evidence of cellulitis she should take the mother directly to the OR.Marland Kitchen She will come back and see me next week if possible Electronic Signature(s) Signed: 02/27/2015 11:47:18 AM By: Christin Fudge MD, FACS Entered By: Christin Fudge on 02/27/2015 11:47:18 MARILYNNE, DUPUIS ([id number]) -------------------------------------------------------------------------------- SuperBill Details Patient Name: [name] Date of Service: 02/27/2015 Medical Record Number: [id number] Patient Account Number: [id number] Date of Birth/Sex: 09-Oct-1940 (75 y.o. Female) Treating RN:  Carolyne Fiscal, Debi Primary Care Physician: SYSTEM, PCP Other Clinician: Referring Physician: Durenda Guthrie Treating Physician/Extender: Frann Rider in Treatment: 7 Diagnosis Coding ICD-10 Codes Code Description E11.621 Type 2 diabetes mellitus with  foot ulcer L89.623 Pressure ulcer of left heel, stage 3 C83.10 Mantle cell lymphoma, unspecified site Z92.21 Personal history of antineoplastic chemotherapy Facility Procedures CPT4 Code: [id number] Description: 09311 - DEB SUBQ TISSUE 20 SQ CM/< ICD-10 Description Diagnosis E11.621 Type 2 diabetes mellitus with foot ulcer L89.623 Pressure ulcer of left heel, stage 3 C83.10 Mantle cell lymphoma, unspecified site Z92.21 Personal history of  antineoplastic chemotherapy Modifier: Quantity: 1 Physician Procedures CPT4 Code: 2162446 Description: 95072 - WC PHYS SUBQ TISS 20 SQ CM ICD-10 Description Diagnosis E11.621 Type 2 diabetes mellitus with foot ulcer L89.623 Pressure ulcer of left heel, stage 3 C83.10 Mantle cell lymphoma, unspecified site Z92.21 Personal history of  antineoplastic chemotherapy Modifier: Quantity: 1 Electronic Signature(s) Signed: 02/27/2015 11:49:29 AM By: Christin Fudge MD, FACS Entered By: Christin Fudge on 02/27/2015 11:49:28

## 2015-03-11 ENCOUNTER — Ambulatory Visit: Payer: Self-pay | Admitting: Surgery

## 2015-03-13 ENCOUNTER — Encounter: Payer: Self-pay | Admitting: *Deleted

## 2015-03-16 ENCOUNTER — Encounter: Payer: Self-pay | Admitting: Surgery

## 2015-03-16 ENCOUNTER — Encounter: Payer: Self-pay | Admitting: Registered Nurse

## 2015-03-16 ENCOUNTER — Ambulatory Visit
Admission: RE | Admit: 2015-03-16 | Discharge: 2015-03-16 | Disposition: A | Discharge status: Home / Self Care | Payer: Medicare (Managed Care) | Source: Ambulatory Visit | Attending: Gastroenterology | Admitting: Gastroenterology

## 2015-03-16 ENCOUNTER — Encounter
Admission: RE | Disposition: A | Discharge status: Home / Self Care | Payer: Self-pay | Source: Ambulatory Visit | Attending: Gastroenterology

## 2015-03-16 DIAGNOSIS — R933 Abnormal findings on diagnostic imaging of other parts of digestive tract: Secondary | ICD-10-CM | POA: Diagnosis not present

## 2015-03-16 DIAGNOSIS — Z539 Procedure and treatment not carried out, unspecified reason: Secondary | ICD-10-CM | POA: Diagnosis not present

## 2015-03-16 LAB — GLUCOSE, CAPILLARY: Glucose-Capillary: 160 mg/dL — ABNORMAL HIGH (ref 65–99)

## 2015-03-16 SURGERY — COLONOSCOPY WITH PROPOFOL
Anesthesia: General

## 2015-03-16 MED ORDER — SODIUM CHLORIDE 0.9 % IV SOLN: INTRAVENOUS | Status: DC

## 2015-03-16 NOTE — H&P (Signed)
  Date of Initial H&P: 02/25/2015  History reviewed, patient examined, no change in status, stable for surgery.

## 2015-03-17 ENCOUNTER — Encounter: Payer: Self-pay | Admitting: Surgery

## 2015-03-17 ENCOUNTER — Ambulatory Visit (INDEPENDENT_AMBULATORY_CARE_PROVIDER_SITE_OTHER): Payer: PRIVATE HEALTH INSURANCE | Admitting: Surgery

## 2015-03-17 VITALS — Ht 65.0 in

## 2015-03-17 DIAGNOSIS — L97522 Non-pressure chronic ulcer of other part of left foot with fat layer exposed: Secondary | ICD-10-CM | POA: Diagnosis not present

## 2015-03-17 DIAGNOSIS — E1169 Type 2 diabetes mellitus with other specified complication: Secondary | ICD-10-CM | POA: Diagnosis not present

## 2015-03-17 DIAGNOSIS — M869 Osteomyelitis, unspecified: Secondary | ICD-10-CM

## 2015-03-17 NOTE — H&P (Signed)
Patient ID: [name], female   DOB: Mar 06, 1940, 75 y.o.   MRN: [id number]  History of Present Illness [name] is a 75 y.o. female with left heel ulcer. She is semi-3 and debilitated with multiple comorbidities including diabetes, obesity, recent stroke, coronary artery disease. He had a stroke in October 2016 and since then she has been in and we will check she does move all her extremities and subsequently she also had a left ankle injury. X-ray showing a nondisplaced oblique fracture of the distal fibula and there is also some chronic osteomyelitis. There is also a screw from [place] that apparently patient reports has been there for several years. 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 He is accompanied by her daughter. Currently 1 of her issues is a nonhealing ulcer on the left side she has been going for wound care for multiple debridements and cleanings.To  My knowledge He has really address the possible cellulitis on her left ankle. He is currently nonweightbearing on the left foot and wears a boot  Past Medical History Past Medical History  Diagnosis Date  . Diabetes mellitus without complication ([place])   . Arthritis   . Non Hodgkin's lymphoma ([place]) 2009    s/p stem cell transplant  . Hypertension   . MI (myocardial infarction) ([place])     1990s  . TIA (transient ischemic attack)   . Spinal stenosis   . HLD (hyperlipidemia)   . GERD (gastroesophageal reflux disease)      Past Surgical History  Procedure Laterality Date  . Abdominal hysterectomy    . Toe amputation Left   . Eye surgery Bilateral     Allergies  Allergen Reactions  . Penicillins Hives    No current facility-administered medications for this visit.   No current outpatient prescriptions on file.   Facility-Administered Medications Ordered  in Other Visits  Medication Dose Route Frequency Provider Last Rate Last Dose  . 0.9 %  sodium chloride infusion   Intravenous Continuous Hulen Luster, MD      . 0.9 %  sodium chloride infusion   Intravenous Continuous Hulen Luster, MD        Family History Family History  Problem Relation Age of Onset  . Hypertension Mother   . Heart disease Mother   . Diabetes Father   . Breast cancer Sister       Social History Social History  Substance Use Topics  . Smoking status: Former Research scientist (life sciences)  . Smokeless tobacco: Never Used  . Alcohol Use: No      ROS Temporal review of system was performed and is otherwise negative on of what is stated in the history of present illness  Physical Exam Height 5\' 5"  (1.651 m).  CONSTITUTIONAL: Debilitated elderly female, obese on a wheelchair EYES: Pupils equal, round, and reactive to light, Sclera non-icteric. EARS, NOSE, MOUTH AND THROAT: The oropharynx is clear. Oral mucosa is pink and moist. Hearing is intact to voice.  NECK: Trachea is midline, and there is no jugular venous distension. Thyroid is without palpable abnormalities. LYMPH NODES:  Lymph nodes in the neck are not enlarged. RESPIRATORY:  Lungs are clear, and breath sounds are equal bilaterally. Normal respiratory effort without pathologic use of accessory muscles. CARDIOVASCULAR: Heart is regular without murmurs, gallops, or rubs. GI: The  abdomen is  soft, nontender, and nondistended. There were no palpable masses. There was no hepatosplenomegaly. There were normal bowel sounds. There is laparotomy scar VASCULAR: Palpable femoral pulses with nonpalpable pedal pulses. Decreased capillary refill extremities are warm to touch and there is a degree of neuropathy. There are 2 wounds over the Achilles tendon one measuring 1 cm and the other one measuring half a centimeter. They tracked superiorly for approximately 5 cm and the inferior wound and also tracks at least 3 cm in depth. She is insensate in  those areas.   Evidence of necrotizing infection or abscess There is skin changes suggestive of venous disease as well NEUROLOGIC:  Motor and sensation is grossly normal.  Cranial nerves are grossly intact. PSYCH:  Alert and oriented to person, place and time. Affect is normal.  Data Reviewed  I have personally reviewed the patient's imaging and medical records.    Assessment/ Plan   Complex ulcerations on the left heel proximal combination of pressure ulcer and vascular disease in the setting of a debilitated patient with a recent stroke and multiple comorbidities including lymphoma coronary artery disease and diabetes. Before any debridement is attempted I do think that the most prudent and safest thing to do is to try to figure out why this has not healed. We'll start with vascular workup in the form of PVRs although since she is diabetic the small might be falsely elevated. I am will also obtain and vascular surgery consult evaluate for any revascularizable lesions on the lower extremities. And also I'll like them to comment on the chronic cellulitis and how this would affect the wound healing process of the open wound. Her all this is a complex and challenging issue that requires further workup. Apparently the daughter says that before the stroke she was small bowel functional. No need for immediate surgical intervention at this time and extensive counseling provided to the daughter and to the patient  Caroleen Hamman, MD [place] 03/17/2015, 2:22 PM

## 2015-03-18 ENCOUNTER — Telehealth: Payer: Self-pay

## 2015-03-18 NOTE — Telephone Encounter (Signed)
Ebony from [facility] called asking for patient's office visit from yesterday. She also wanted to know if we had made the referral to Vein and Vascular Surgeon. I told her that as soon as I made the appointment. They were okay with that.  [name]'s office visit was faxed at [phone] Attention: Charlena Cross

## 2015-03-19 NOTE — Telephone Encounter (Signed)
[facility] and spoke with Charlena Cross to let her know that patient will be seen at [facility] on 03/23/2015 at 10:00 AM by Dr. Delana Meyer. I also told her that she her daughter will be taking her to her appointment. Ebony understood.

## 2015-03-19 NOTE — Telephone Encounter (Signed)
Called [facility] to schedule patient an appointment to be seen. I was given an appointment on 03/23/2015 with Dr. Delana Meyer at 10:00 AM. [facility] and left a message on Ebony's voicemail to please call me.  Patient was contacted but spoke with daughter [name]) in reference to this appointment. She understood and stated that she will take her to her appointment.

## 2015-03-30 ENCOUNTER — Other Ambulatory Visit: Payer: Self-pay | Admitting: Vascular Surgery

## 2015-04-01 ENCOUNTER — Encounter
Admission: RE | Disposition: A | Discharge status: Home / Self Care | Payer: Self-pay | Source: Ambulatory Visit | Attending: Vascular Surgery

## 2015-04-01 ENCOUNTER — Ambulatory Visit
Admission: RE | Admit: 2015-04-01 | Discharge: 2015-04-01 | Disposition: A | Discharge status: Home / Self Care | Payer: Medicare (Managed Care) | Source: Ambulatory Visit | Attending: Vascular Surgery | Admitting: Vascular Surgery

## 2015-04-01 DIAGNOSIS — Z880 Allergy status to penicillin: Secondary | ICD-10-CM | POA: Diagnosis not present

## 2015-04-01 DIAGNOSIS — E669 Obesity, unspecified: Secondary | ICD-10-CM | POA: Diagnosis not present

## 2015-04-01 DIAGNOSIS — C8590 Non-Hodgkin lymphoma, unspecified, unspecified site: Secondary | ICD-10-CM | POA: Diagnosis not present

## 2015-04-01 DIAGNOSIS — Z8249 Family history of ischemic heart disease and other diseases of the circulatory system: Secondary | ICD-10-CM | POA: Insufficient documentation

## 2015-04-01 DIAGNOSIS — M1990 Unspecified osteoarthritis, unspecified site: Secondary | ICD-10-CM | POA: Diagnosis not present

## 2015-04-01 DIAGNOSIS — I7025 Atherosclerosis of native arteries of other extremities with ulceration: Secondary | ICD-10-CM | POA: Insufficient documentation

## 2015-04-01 DIAGNOSIS — Z87891 Personal history of nicotine dependence: Secondary | ICD-10-CM | POA: Diagnosis not present

## 2015-04-01 DIAGNOSIS — Z87311 Personal history of (healed) other pathological fracture: Secondary | ICD-10-CM | POA: Insufficient documentation

## 2015-04-01 DIAGNOSIS — Z803 Family history of malignant neoplasm of breast: Secondary | ICD-10-CM | POA: Diagnosis not present

## 2015-04-01 DIAGNOSIS — L89629 Pressure ulcer of left heel, unspecified stage: Secondary | ICD-10-CM | POA: Insufficient documentation

## 2015-04-01 DIAGNOSIS — Z89422 Acquired absence of other left toe(s): Secondary | ICD-10-CM | POA: Insufficient documentation

## 2015-04-01 DIAGNOSIS — Z833 Family history of diabetes mellitus: Secondary | ICD-10-CM | POA: Insufficient documentation

## 2015-04-01 DIAGNOSIS — K219 Gastro-esophageal reflux disease without esophagitis: Secondary | ICD-10-CM | POA: Insufficient documentation

## 2015-04-01 DIAGNOSIS — G459 Transient cerebral ischemic attack, unspecified: Secondary | ICD-10-CM | POA: Diagnosis not present

## 2015-04-01 DIAGNOSIS — Z90710 Acquired absence of both cervix and uterus: Secondary | ICD-10-CM | POA: Diagnosis not present

## 2015-04-01 DIAGNOSIS — E119 Type 2 diabetes mellitus without complications: Secondary | ICD-10-CM | POA: Insufficient documentation

## 2015-04-01 DIAGNOSIS — M4800 Spinal stenosis, site unspecified: Secondary | ICD-10-CM | POA: Diagnosis not present

## 2015-04-01 DIAGNOSIS — E785 Hyperlipidemia, unspecified: Secondary | ICD-10-CM | POA: Diagnosis not present

## 2015-04-01 DIAGNOSIS — I252 Old myocardial infarction: Secondary | ICD-10-CM | POA: Insufficient documentation

## 2015-04-01 HISTORY — PX: PERIPHERAL VASCULAR CATHETERIZATION: SHX172C

## 2015-04-01 LAB — CREATININE, SERUM
Creatinine, Ser: 1.11 mg/dL — ABNORMAL HIGH (ref 0.44–1.00)
GFR calc non Af Amer: 48 mL/min — ABNORMAL LOW (ref >60)
GFR, EST AFRICAN AMERICAN: 56 mL/min — AB (ref >60)

## 2015-04-01 LAB — BUN: BUN: 24 mg/dL — ABNORMAL HIGH (ref 6–20)

## 2015-04-01 SURGERY — ABDOMINAL AORTOGRAM W/LOWER EXTREMITY
Wound class: Clean

## 2015-04-01 MED ORDER — HEPARIN SODIUM (PORCINE) 1000 UNIT/ML IJ SOLN
INTRAMUSCULAR | Status: AC
  Filled 2015-04-01: qty 1

## 2015-04-01 MED ORDER — MIDAZOLAM HCL 2 MG/2ML IJ SOLN
INTRAMUSCULAR | Status: DC | PRN
  Administered 2015-04-01: 2 mg via INTRAVENOUS

## 2015-04-01 MED ORDER — SODIUM CHLORIDE 0.9 % IV SOLN
INTRAVENOUS | Status: DC
  Administered 2015-04-01: 13:00:00 via INTRAVENOUS

## 2015-04-01 MED ORDER — IOPAMIDOL (ISOVUE-300) INJECTION 61%
INTRAVENOUS | Status: DC | PRN
  Administered 2015-04-01: 60 mL via INTRA_ARTERIAL

## 2015-04-01 MED ORDER — HEPARIN SODIUM (PORCINE) 1000 UNIT/ML IJ SOLN
INTRAMUSCULAR | Status: DC | PRN
  Administered 2015-04-01: 3000 [IU] via INTRAVENOUS

## 2015-04-01 MED ORDER — FAMOTIDINE 20 MG PO TABS: 40.0000 mg | ORAL_TABLET | ORAL | Status: DC | PRN

## 2015-04-01 MED ORDER — LIDOCAINE HCL (PF) 1 % IJ SOLN
INTRAMUSCULAR | Status: AC
  Filled 2015-04-01: qty 30

## 2015-04-01 MED ORDER — METHYLPREDNISOLONE SODIUM SUCC 125 MG IJ SOLR: 125.0000 mg | INTRAMUSCULAR | Status: DC | PRN

## 2015-04-01 MED ORDER — HEPARIN (PORCINE) IN NACL 2-0.9 UNIT/ML-% IJ SOLN
INTRAMUSCULAR | Status: AC
  Filled 2015-04-01: qty 1000

## 2015-04-01 MED ORDER — MIDAZOLAM HCL 5 MG/5ML IJ SOLN
INTRAMUSCULAR | Status: AC
  Filled 2015-04-01: qty 5

## 2015-04-01 MED ORDER — FENTANYL CITRATE (PF) 100 MCG/2ML IJ SOLN
INTRAMUSCULAR | Status: AC
  Filled 2015-04-01: qty 2

## 2015-04-01 MED ORDER — CLINDAMYCIN PHOSPHATE 300 MG/50ML IV SOLN
300.0000 mg | Freq: Once | INTRAVENOUS | Status: DC
Start: 2015-04-01 — End: 2015-04-01

## 2015-04-01 MED ORDER — LABETALOL HCL 5 MG/ML IV SOLN
INTRAVENOUS | Status: AC
  Filled 2015-04-01: qty 4

## 2015-04-01 MED ORDER — LABETALOL HCL 5 MG/ML IV SOLN
INTRAVENOUS | Status: DC | PRN
  Administered 2015-04-01 (×2): 10 mg via INTRAVENOUS

## 2015-04-01 MED ORDER — HYDROMORPHONE HCL 1 MG/ML IJ SOLN: 1.0000 mg | Freq: Once | INTRAMUSCULAR | Status: DC

## 2015-04-01 MED ORDER — ONDANSETRON HCL 4 MG/2ML IJ SOLN: 4.0000 mg | Freq: Four times a day (QID) | INTRAMUSCULAR | Status: DC | PRN

## 2015-04-01 MED ORDER — LIDOCAINE HCL (PF) 1 % IJ SOLN
INTRAMUSCULAR | Status: DC | PRN
  Administered 2015-04-01: 5 mL via INTRADERMAL

## 2015-04-01 MED ORDER — FENTANYL CITRATE (PF) 100 MCG/2ML IJ SOLN
INTRAMUSCULAR | Status: DC | PRN
  Administered 2015-04-01: 50 ug via INTRAVENOUS

## 2015-04-01 MED ORDER — CLINDAMYCIN PHOSPHATE 300 MG/50ML IV SOLN
INTRAVENOUS | Status: AC
  Filled 2015-04-01: qty 50

## 2015-04-01 SURGICAL SUPPLY — 18 items
CATH CXI SUPP ST 2.6FR 150CM (MICROCATHETER) ×5 IMPLANT
CATH CXI SUPP ST 4FR 135CM (MICROCATHETER) ×5 IMPLANT
CATH PIG 70CM (CATHETERS) ×5 IMPLANT
DEVICE PRESTO INFLATION (MISCELLANEOUS) ×5 IMPLANT
DEVICE STARCLOSE SE CLOSURE (Vascular Products) ×5 IMPLANT
GLIDECATH 4FR STR (CATHETERS) ×5 IMPLANT
GLIDECATH ANGLED 4FR 120CM (CATHETERS) ×5 IMPLANT
GLIDEWIRE ANGLED SS 035X260CM (WIRE) ×5 IMPLANT
GUIDEWIRE ANGLED .035X260CM (WIRE) ×5 IMPLANT
PACK ANGIOGRAPHY (CUSTOM PROCEDURE TRAY) ×5 IMPLANT
SET INTRO CAPELLA COAXIAL (SET/KITS/TRAYS/PACK) ×5 IMPLANT
SHEATH ANL2 6FRX45 HC (SHEATH) ×5 IMPLANT
SHEATH BRITE TIP 5FRX11 (SHEATH) ×5 IMPLANT
SYR MEDRAD MARK V 150ML (SYRINGE) ×5 IMPLANT
TUBING CONTRAST HIGH PRESS 72 (TUBING) ×5 IMPLANT
WIRE G V18X300CM (WIRE) ×5 IMPLANT
WIRE J 3MM .035X145CM (WIRE) ×5 IMPLANT
WIRE MAGIC TORQUE 260C (WIRE) ×5 IMPLANT

## 2015-04-01 NOTE — H&P (Signed)
[facility] History & Physical Update  The patient was interviewed and re-examined.  The patient's previous History and Physical has been reviewed and is unchanged.  There is no change in the plan of care. We plan to proceed with the scheduled procedure.  Schnier, Dolores Lory, MD  04/01/2015, 1:08 PM

## 2015-04-01 NOTE — Op Note (Signed)
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  Abdominal aortogram  2.  Left lower extremity distal runoff third order catheter placement  3.  StarClose closure of right common femoral arteriotomy    Anesthesia: Conscious sedation was administered under my direct supervision. IV Versed plus fentanyl were utilized. Continuous ECG, pulse oximetry and blood pressure was monitored throughout the entire procedure. A total of 3 milligrams of Versed and 150 micrograms of fentanyl were utilized.  Conscious sedation was administered for a total of 56 minutes.  Sheath: 6 French Rabi right common femoral artery  Contrast: 60 cc   Fluoroscopy Time: 21.8 minutes  Indications:  [name] presented to the office with worsening ulceration of the left ankle area. Physical examination as well as noninvasive studies support this is a mixed ulcer with both venous as well as arterial components. Risks and benefits for arterial studies as well as possible intervention were reviewed all questions were answered patient agrees to proceed  Procedure:  [name] a 75 y.o. female who was identified and appropriate procedural time out was performed.  The patient was then placed supine on the table and prepped and draped in the usual sterile fashion.  Ultrasound was used to evaluate the right common femoral artery.  It was patent .  A digital ultrasound image was acquired.  Amicropuncture needle was used to access the right common femoral artery under direct ultrasound guidance and a permanent image wassaved for the record.microwire was then advanced under fluoroscopic guidance followed by micro-sheath.  A 0.035 J wire was advanced  without resistance and a 5Fr sheath was placed.    Pigtail catheter was then advanced to the level of T12 and AP projection of the aorta was obtained. Pigtail catheter was then repositioned to above the bifurcation and RAO view of the pelvis was obtained. Regular angled Glidewire and pigtail catheter and then a straight glide catheter was then used across the bifurcation and the catheter was positioned in the distal external iliac artery.  LAO of the left groin was then obtained. Wire was reintroduced and negotiated into the SFA and the catheter was advanced into the SFA. Distal runoff was then performed. Distal images were inadequate and therefore a long Kumpe catheter was advanced over the wire in order to maintain wire purchase given the tortuosity of her aortoiliac system a 6 French rabies catheter was advanced up and over the bifurcation positioned with its tip in the proximal SFA. Working from the [employer] catheter magnified images of the tibial vessels were then obtained. The occluded posterior tibial was probed with a variety of wires but no intervention was attempted  After review of the images the catheter was removed over wire and an RAO view of the groin was obtained. StarClose device was deployed without difficulty.  Findings:   Aortogram:  The aorta is widely patent with minimal evidence of atherosclerotic changes.  Right Lower Extremity:  The right common external and internal iliac arteries are widely patent as is the common femoral and visualized portions of the profunda femoris and superficial femoral.  Left Lower Extremity:  The left common internal and external iliac arteries are widely patent. Left common femoral is widely patent. The profunda femoris is widely patent. The superficial femoral artery demonstrates mild atherosclerotic  changes but there are no hemodynamically significant lesions noted this is also true the popliteal.  The trifurcation however does demonstrate significant  disease the anterior tibial artery occludes approximately 1 cm distal to its origin and remains occluded throughout its entire length there is no significant reconstitution of the dorsalis pedis either. The peroneal and tibioperoneal trunk are widely patent and the peroneal collateralizes to the lateral plantar vessels at the level of the ankle and this fills the pedal arch. Overall the peroneal appears to be quite good and there are no flow-limiting lesions noted. There is a flush occlusion of the origin of the posterior tibial however at the ankle there is reconstitution of the distal posterior tibial and lateral plantar branches largely from collaterals emanating from the peroneal.  Summary: The patient has in-line flow from the aorta to the ankle via single-vessel runoff, the peroneal. Given this situation with wound care and control of her venous disease I would hope that her wound would heal. However, in the event that we find ourselves and a limb threatening nonhealing situation and certainly at options to recanalize the posterior tibial do exist potentially crosser atherectomy and or access in a retrograde fashion of the distal posterior tibial are possibilities. However given the real potential to disrupt the peroneal flow I do not believe interventions of this type is warranted at this time.   Disposition: Patient was taken to the recovery room in stable condition having tolerated the procedure well.  [name], Dolores Lory 04/01/2015,2:45 PM

## 2015-04-01 NOTE — Discharge Instructions (Signed)
Angiogram, Care After °Refer to this sheet in the next few weeks. These instructions provide you with information about caring for yourself after your procedure. Your health care provider may also give you more specific instructions. Your treatment has been planned according to current medical practices, but problems sometimes occur. Call your health care provider if you have any problems or questions after your procedure. °WHAT TO EXPECT AFTER THE PROCEDURE °After your procedure, it is typical to have the following: °· Bruising at the catheter insertion site that usually fades within 1-2 weeks. °· Blood collecting in the tissue (hematoma) that may be painful to the touch. It should usually decrease in size and tenderness within 1-2 weeks. °HOME CARE INSTRUCTIONS °· Take medicines only as directed by your health care provider. °· You may shower 24-48 hours after the procedure or as directed by your health care provider. Remove the bandage (dressing) and gently wash the site with plain soap and water. Pat the area dry with a clean towel. Do not rub the site, because this may cause bleeding. °· Do not take baths, swim, or use a hot tub until your health care provider approves. °· Check your insertion site every day for redness, swelling, or drainage. °· Do not apply powder or lotion to the site. °· Do not lift over 10 lb (4.5 kg) for 5 days after your procedure or as directed by your health care provider. °· Ask your health care provider when it is okay to: °¨ Return to work or school. °¨ Resume usual physical activities or sports. °¨ Resume sexual activity. °· Do not drive home if you are discharged the same day as the procedure. Have someone else drive you. °· You may drive 24 hours after the procedure unless otherwise instructed by your health care provider. °· Do not operate machinery or power tools for 24 hours after the procedure or as directed by your health care provider. °· If your procedure was done as an  outpatient procedure, which means that you went home the same day as your procedure, a responsible adult should be with you for the first 24 hours after you arrive home. °· Keep all follow-up visits as directed by your health care provider. This is important. °SEEK MEDICAL CARE IF: °· You have a fever. °· You have chills. °· You have increased bleeding from the catheter insertion site. Hold pressure on the site. °SEEK IMMEDIATE MEDICAL CARE IF: °· You have unusual pain at the catheter insertion site. °· You have redness, warmth, or swelling at the catheter insertion site. °· You have drainage (other than a small amount of blood on the dressing) from the catheter insertion site. °· The catheter insertion site is bleeding, and the bleeding does not stop after 30 minutes of holding steady pressure on the site. °· The area near or just beyond the catheter insertion site becomes pale, cool, tingly, or numb. °  °This information is not intended to replace advice given to you by your health care provider. Make sure you discuss any questions you have with your health care provider. °  °Document Released: 07/08/2004 Document Revised: 01/10/2014 Document Reviewed: 05/23/2012 °Elsevier Interactive Patient Education ©2016 [employer] ° °

## 2015-04-05 ENCOUNTER — Encounter: Payer: Self-pay | Admitting: Vascular Surgery

## 2015-04-15 ENCOUNTER — Ambulatory Visit: Payer: Medicare (Managed Care) | Admitting: Surgery

## 2015-04-16 ENCOUNTER — Encounter: Payer: Self-pay | Admitting: Surgery

## 2015-04-16 ENCOUNTER — Ambulatory Visit (INDEPENDENT_AMBULATORY_CARE_PROVIDER_SITE_OTHER): Payer: PRIVATE HEALTH INSURANCE | Admitting: Surgery

## 2015-04-16 ENCOUNTER — Ambulatory Visit: Payer: Medicare (Managed Care) | Admitting: Surgery

## 2015-04-16 VITALS — BP 112/68 | HR 73 | Temp 98.3°F

## 2015-04-16 DIAGNOSIS — L97521 Non-pressure chronic ulcer of other part of left foot limited to breakdown of skin: Secondary | ICD-10-CM

## 2015-04-16 NOTE — Progress Notes (Signed)
[name] is a 75 y.o. female with left heel ulcer. She  debilitated with multiple comorbidities including diabetes, obesity, recent stroke, coronary artery disease. He had a stroke in October 2016 and since then she has been in and we will check she does move all her extremities and subsequently she also had a left ankle injury. X-ray showing a nondisplaced oblique fracture of the distal fibula and there is also some chronic osteomyelitis. There is also a screw from [employer] that apparently patient reports has been there for several years. 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 He is accompanied by her daughter. Currently her issues is a nonhealing ulcer on the left side she has been going for wound care for multiple debridements and cleanings. She had a recent vascular consult w angio showing a single vessel perfusing her foot,. Given her anatomy and co morbidities no other revascularization required. Ulcer has actually improved as well as her fx  PE : debilitated female in NAD Ext: there is a small opening 6 mm tracking superior for 3 cms, it is improved as compared to my last visit. No necrosis, no abscess  A/p ulcer left heel, had a lengthy discussion with the family and with the patient herself. I'm major debridement may actually worsen and delayed wound healing. They want to try for now local wound care. We will send her to the wound care clinic. No surgical indication at this time.

## 2015-04-16 NOTE — Patient Instructions (Signed)
Please see the Physicians Order provided. If [facility] has questions, please have them call our office.

## 2015-04-27 ENCOUNTER — Ambulatory Visit
Admission: RE | Admit: 2015-04-27 | Payer: PRIVATE HEALTH INSURANCE | Source: Ambulatory Visit | Admitting: Gastroenterology

## 2015-04-27 ENCOUNTER — Encounter: Payer: Self-pay | Admitting: Anesthesiology

## 2015-04-27 SURGERY — COLONOSCOPY WITH PROPOFOL
Anesthesia: General

## 2015-08-10 ENCOUNTER — Other Ambulatory Visit: Payer: Self-pay | Admitting: Family Medicine

## 2015-08-10 DIAGNOSIS — W050XXA Fall from non-moving wheelchair, initial encounter: Secondary | ICD-10-CM

## 2015-08-12 ENCOUNTER — Ambulatory Visit
Admission: RE | Admit: 2015-08-12 | Discharge: 2015-08-12 | Disposition: A | Discharge status: Home / Self Care | Payer: Medicare (Managed Care) | Source: Ambulatory Visit | Attending: Family Medicine | Admitting: Family Medicine

## 2015-08-12 DIAGNOSIS — M160 Bilateral primary osteoarthritis of hip: Secondary | ICD-10-CM | POA: Diagnosis not present

## 2015-08-12 DIAGNOSIS — W050XXA Fall from non-moving wheelchair, initial encounter: Secondary | ICD-10-CM

## 2015-08-12 DIAGNOSIS — M533 Sacrococcygeal disorders, not elsewhere classified: Secondary | ICD-10-CM | POA: Diagnosis present

## 2015-11-19 ENCOUNTER — Emergency Department: Payer: Medicare Other

## 2015-11-19 ENCOUNTER — Encounter: Payer: Self-pay | Admitting: Emergency Medicine

## 2015-11-19 ENCOUNTER — Inpatient Hospital Stay
Admission: EM | Admit: 2015-11-19 | Discharge: 2015-11-24 | DRG: 291 | Disposition: A | Discharge status: Skilled Nursing Facility | Payer: Medicare Other | Attending: Internal Medicine | Admitting: Internal Medicine

## 2015-11-19 DIAGNOSIS — R161 Splenomegaly, not elsewhere classified: Secondary | ICD-10-CM | POA: Diagnosis not present

## 2015-11-19 DIAGNOSIS — Z7982 Long term (current) use of aspirin: Secondary | ICD-10-CM

## 2015-11-19 DIAGNOSIS — N183 Chronic kidney disease, stage 3 (moderate): Secondary | ICD-10-CM | POA: Diagnosis present

## 2015-11-19 DIAGNOSIS — Z7951 Long term (current) use of inhaled steroids: Secondary | ICD-10-CM | POA: Diagnosis not present

## 2015-11-19 DIAGNOSIS — J9602 Acute respiratory failure with hypercapnia: Secondary | ICD-10-CM

## 2015-11-19 DIAGNOSIS — K639 Disease of intestine, unspecified: Secondary | ICD-10-CM | POA: Diagnosis present

## 2015-11-19 DIAGNOSIS — C8318 Mantle cell lymphoma, lymph nodes of multiple sites: Secondary | ICD-10-CM | POA: Diagnosis not present

## 2015-11-19 DIAGNOSIS — I130 Hypertensive heart and chronic kidney disease with heart failure and stage 1 through stage 4 chronic kidney disease, or unspecified chronic kidney disease: Principal | ICD-10-CM | POA: Diagnosis present

## 2015-11-19 DIAGNOSIS — E11649 Type 2 diabetes mellitus with hypoglycemia without coma: Secondary | ICD-10-CM | POA: Diagnosis present

## 2015-11-19 DIAGNOSIS — J9622 Acute and chronic respiratory failure with hypercapnia: Secondary | ICD-10-CM | POA: Diagnosis present

## 2015-11-19 DIAGNOSIS — E662 Morbid (severe) obesity with alveolar hypoventilation: Secondary | ICD-10-CM | POA: Diagnosis present

## 2015-11-19 DIAGNOSIS — E785 Hyperlipidemia, unspecified: Secondary | ICD-10-CM | POA: Diagnosis present

## 2015-11-19 DIAGNOSIS — Z880 Allergy status to penicillin: Secondary | ICD-10-CM

## 2015-11-19 DIAGNOSIS — Z66 Do not resuscitate: Secondary | ICD-10-CM | POA: Diagnosis present

## 2015-11-19 DIAGNOSIS — Z803 Family history of malignant neoplasm of breast: Secondary | ICD-10-CM | POA: Diagnosis not present

## 2015-11-19 DIAGNOSIS — Z8249 Family history of ischemic heart disease and other diseases of the circulatory system: Secondary | ICD-10-CM

## 2015-11-19 DIAGNOSIS — Z8572 Personal history of non-Hodgkin lymphomas: Secondary | ICD-10-CM

## 2015-11-19 DIAGNOSIS — M1990 Unspecified osteoarthritis, unspecified site: Secondary | ICD-10-CM | POA: Diagnosis present

## 2015-11-19 DIAGNOSIS — Z87891 Personal history of nicotine dependence: Secondary | ICD-10-CM

## 2015-11-19 DIAGNOSIS — Z833 Family history of diabetes mellitus: Secondary | ICD-10-CM

## 2015-11-19 DIAGNOSIS — J9600 Acute respiratory failure, unspecified whether with hypoxia or hypercapnia: Secondary | ICD-10-CM

## 2015-11-19 DIAGNOSIS — G9340 Encephalopathy, unspecified: Secondary | ICD-10-CM | POA: Diagnosis not present

## 2015-11-19 DIAGNOSIS — D509 Iron deficiency anemia, unspecified: Secondary | ICD-10-CM | POA: Diagnosis present

## 2015-11-19 DIAGNOSIS — I110 Hypertensive heart disease with heart failure: Secondary | ICD-10-CM | POA: Diagnosis present

## 2015-11-19 DIAGNOSIS — I252 Old myocardial infarction: Secondary | ICD-10-CM | POA: Diagnosis not present

## 2015-11-19 DIAGNOSIS — R609 Edema, unspecified: Secondary | ICD-10-CM

## 2015-11-19 DIAGNOSIS — E1122 Type 2 diabetes mellitus with diabetic chronic kidney disease: Secondary | ICD-10-CM | POA: Diagnosis present

## 2015-11-19 DIAGNOSIS — M542 Cervicalgia: Secondary | ICD-10-CM | POA: Diagnosis not present

## 2015-11-19 DIAGNOSIS — Z6836 Body mass index (BMI) 36.0-36.9, adult: Secondary | ICD-10-CM

## 2015-11-19 DIAGNOSIS — M4800 Spinal stenosis, site unspecified: Secondary | ICD-10-CM | POA: Diagnosis present

## 2015-11-19 DIAGNOSIS — K219 Gastro-esophageal reflux disease without esophagitis: Secondary | ICD-10-CM | POA: Diagnosis present

## 2015-11-19 DIAGNOSIS — Z8673 Personal history of transient ischemic attack (TIA), and cerebral infarction without residual deficits: Secondary | ICD-10-CM | POA: Diagnosis not present

## 2015-11-19 DIAGNOSIS — R4182 Altered mental status, unspecified: Secondary | ICD-10-CM

## 2015-11-19 DIAGNOSIS — R590 Localized enlarged lymph nodes: Secondary | ICD-10-CM | POA: Diagnosis present

## 2015-11-19 DIAGNOSIS — Z794 Long term (current) use of insulin: Secondary | ICD-10-CM | POA: Diagnosis not present

## 2015-11-19 DIAGNOSIS — M6281 Muscle weakness (generalized): Secondary | ICD-10-CM

## 2015-11-19 LAB — COMPREHENSIVE METABOLIC PANEL
ALBUMIN: 3.7 g/dL (ref 3.5–5.0)
ALT: 17 U/L (ref 14–54)
ANION GAP: 5 (ref 5–15)
AST: 30 U/L (ref 15–41)
Alkaline Phosphatase: 78 U/L (ref 38–126)
BILIRUBIN TOTAL: 0.7 mg/dL (ref 0.3–1.2)
BUN: 23 mg/dL — ABNORMAL HIGH (ref 6–20)
CO2: 36 mmol/L — ABNORMAL HIGH (ref 22–32)
Calcium: 9 mg/dL (ref 8.9–10.3)
Chloride: 97 mmol/L — ABNORMAL LOW (ref 101–111)
Creatinine, Ser: 1 mg/dL (ref 0.44–1.00)
GFR calc non Af Amer: 54 mL/min — ABNORMAL LOW (ref >60)
GLUCOSE: 83 mg/dL (ref 65–99)
POTASSIUM: 4.9 mmol/L (ref 3.5–5.1)
Sodium: 138 mmol/L (ref 135–145)
TOTAL PROTEIN: 7.7 g/dL (ref 6.5–8.1)

## 2015-11-19 LAB — CBC
HCT: 29.5 % — ABNORMAL LOW (ref 35.0–47.0)
HCT: 27.4 % — ABNORMAL LOW (ref 35.0–47.0)
HEMOGLOBIN: 9.1 g/dL — AB (ref 12.0–16.0)
Hemoglobin: 8.5 g/dL — ABNORMAL LOW (ref 12.0–16.0)
MCH: 25.7 pg — AB (ref 26.0–34.0)
MCH: 25.7 pg — AB (ref 26.0–34.0)
MCHC: 30.9 g/dL — AB (ref 32.0–36.0)
MCHC: 31 g/dL — ABNORMAL LOW (ref 32.0–36.0)
MCV: 83.2 fL (ref 80.0–100.0)
MCV: 83.1 fL (ref 80.0–100.0)
PLATELETS: 132 10*3/uL — AB (ref 150–440)
PLATELETS: 113 10*3/uL — AB (ref 150–440)
RBC: 3.54 MIL/uL — ABNORMAL LOW (ref 3.80–5.20)
RBC: 3.29 MIL/uL — AB (ref 3.80–5.20)
RDW: 17.3 % — ABNORMAL HIGH (ref 11.5–14.5)
RDW: 17.2 % — ABNORMAL HIGH (ref 11.5–14.5)
WBC: 10.3 10*3/uL (ref 3.6–11.0)
WBC: 7.3 10*3/uL (ref 3.6–11.0)

## 2015-11-19 LAB — GLUCOSE, CAPILLARY
GLUCOSE-CAPILLARY: 72 mg/dL (ref 65–99)
GLUCOSE-CAPILLARY: 66 mg/dL (ref 65–99)
GLUCOSE-CAPILLARY: 77 mg/dL (ref 65–99)
Glucose-Capillary: 73 mg/dL (ref 65–99)
Glucose-Capillary: 82 mg/dL (ref 65–99)
Glucose-Capillary: 89 mg/dL (ref 65–99)

## 2015-11-19 LAB — DIFFERENTIAL
Basophils Absolute: 0 10*3/uL (ref 0–0.1)
Basophils Relative: 0 %
EOS ABS: 0 10*3/uL (ref 0–0.7)
EOS PCT: 0 %
LYMPHS ABS: 3.6 10*3/uL (ref 1.0–3.6)
Lymphocytes Relative: 35 %
MONO ABS: 1 10*3/uL — AB (ref 0.2–0.9)
Monocytes Relative: 9 %
NEUTROS PCT: 56 %
Neutro Abs: 5.7 10*3/uL (ref 1.4–6.5)

## 2015-11-19 LAB — URINALYSIS COMPLETE WITH MICROSCOPIC (ARMC ONLY)
Bilirubin Urine: NEGATIVE
Glucose, UA: NEGATIVE mg/dL
Ketones, ur: NEGATIVE mg/dL
Leukocytes, UA: NEGATIVE
Nitrite: NEGATIVE
PH: 5 (ref 5.0–8.0)
PROTEIN: 100 mg/dL — AB
Specific Gravity, Urine: 1.012 (ref 1.005–1.030)

## 2015-11-19 LAB — AMMONIA: AMMONIA: 30 umol/L (ref 9–35)

## 2015-11-19 LAB — PROTIME-INR
INR: 0.95
PROTHROMBIN TIME: 12.7 s (ref 11.4–15.2)

## 2015-11-19 LAB — LACTIC ACID, PLASMA
LACTIC ACID, VENOUS: 0.6 mmol/L (ref 0.5–1.9)
Lactic Acid, Venous: 1.2 mmol/L (ref 0.5–1.9)

## 2015-11-19 LAB — TROPONIN I: TROPONIN I: 0.04 ng/mL — AB (ref <0.03)

## 2015-11-19 LAB — CREATININE, SERUM
Creatinine, Ser: 1.05 mg/dL — ABNORMAL HIGH (ref 0.44–1.00)
GFR calc Af Amer: 59 mL/min — ABNORMAL LOW (ref >60)
GFR calc non Af Amer: 51 mL/min — ABNORMAL LOW (ref >60)

## 2015-11-19 LAB — APTT: aPTT: 26 seconds (ref 24–36)

## 2015-11-19 MED ORDER — SODIUM CHLORIDE 0.9% FLUSH
3.0000 mL | Freq: Two times a day (BID) | INTRAVENOUS | Status: DC
  Administered 2015-11-19 – 2015-11-24 (×9): 3 mL via INTRAVENOUS

## 2015-11-19 MED ORDER — ENOXAPARIN SODIUM 40 MG/0.4ML ~~LOC~~ SOLN
40.0000 mg | SUBCUTANEOUS | Status: DC
  Administered 2015-11-19 – 2015-11-23 (×5): 40 mg via SUBCUTANEOUS
  Filled 2015-11-19 (×5): qty 0.4

## 2015-11-19 MED ORDER — ALBUTEROL SULFATE (2.5 MG/3ML) 0.083% IN NEBU: 2.5000 mg | INHALATION_SOLUTION | Freq: Four times a day (QID) | RESPIRATORY_TRACT | Status: DC | PRN

## 2015-11-19 MED ORDER — INSULIN ASPART 100 UNIT/ML ~~LOC~~ SOLN: 0.0000 [IU] | Freq: Three times a day (TID) | SUBCUTANEOUS | Status: DC

## 2015-11-19 MED ORDER — DEXTROSE 10 % IV SOLN
INTRAVENOUS | Status: DC
  Administered 2015-11-19: 16:00:00 via INTRAVENOUS

## 2015-11-19 MED ORDER — ASPIRIN 300 MG RE SUPP
300.0000 mg | Freq: Every day | RECTAL | Status: DC
  Administered 2015-11-19 – 2015-11-20 (×2): 300 mg via RECTAL
  Filled 2015-11-19 (×2): qty 1

## 2015-11-19 MED ORDER — FUROSEMIDE 10 MG/ML IJ SOLN
20.0000 mg | Freq: Two times a day (BID) | INTRAMUSCULAR | Status: DC
  Administered 2015-11-19 – 2015-11-20 (×2): 20 mg via INTRAVENOUS
  Filled 2015-11-19 (×2): qty 2

## 2015-11-19 MED ORDER — FUROSEMIDE 10 MG/ML IJ SOLN: 20.0000 mg | Freq: Once | INTRAMUSCULAR | Status: DC

## 2015-11-19 MED ORDER — ACETAMINOPHEN 650 MG RE SUPP: 650.0000 mg | Freq: Four times a day (QID) | RECTAL | Status: DC | PRN

## 2015-11-19 MED ORDER — SODIUM CHLORIDE 0.9 % IV SOLN: INTRAVENOUS | Status: DC

## 2015-11-19 MED ORDER — ACETAMINOPHEN 325 MG PO TABS: 650.0000 mg | ORAL_TABLET | Freq: Four times a day (QID) | ORAL | Status: DC | PRN

## 2015-11-19 NOTE — Progress Notes (Signed)
Patient's glucose is drifting down she came in with 83--- 72--66 Given her altered mental status/acute encephalopathy I will start her on D10 at 50 cc an hour we'll will check sugars every 3 hours.

## 2015-11-19 NOTE — ED Provider Notes (Signed)
[facility] Emergency Department Provider Note    None    (approximate)  I have reviewed the triage vital signs and the nursing notes.   HISTORY  Chief Complaint Stroke Symptoms    Level V Caveat:  CVA with AMS   HPI [name] is a 75 y.o. female arrives with a signed DO NOT RESUSCITATE. Patient arrives to the ER due to depressed mental status, store respirations and concern for worsening strokelike symptoms that started acutely this morning around 9 AM. After speaking to the daughter at bedside patient has been reportedly having worsening weakness in the left side of her body for the past 2 weeks. Eyes any fevers. No reported chest pain or shortness of breath. History limited due to acute encephalopathy   Past Medical History:  Diagnosis Date  . Arthritis   . Diabetes mellitus without complication ([place])   . GERD (gastroesophageal reflux disease)   . HLD (hyperlipidemia)   . Hypertension   . MI (myocardial infarction)    1990s  . Non Hodgkin's lymphoma ([place]) 2009   s/p stem cell transplant  . Spinal stenosis   . TIA (transient ischemic attack)    Family History  Problem Relation Age of Onset  . Hypertension Mother   . Heart disease Mother   . Diabetes Father   . Breast cancer Sister    Past Surgical History:  Procedure Laterality Date  . ABDOMINAL HYSTERECTOMY    . EYE SURGERY Bilateral   . PERIPHERAL VASCULAR CATHETERIZATION N/A 04/01/2015   Procedure: Abdominal Aortogram w/Lower Extremity;  Surgeon: Katha Cabal, MD;  Location: [facility];  Service: Cardiovascular;  Laterality: N/A;  . PERIPHERAL VASCULAR CATHETERIZATION  04/01/2015   Procedure: Lower Extremity Intervention;  Surgeon: Katha Cabal, MD;  Location: [facility];  Service: Cardiovascular;;  . TOE AMPUTATION Left    Patient Active Problem List   Diagnosis Date Noted  . History of organ or tissue transplant 02/25/2015  . Osteomyelitis ([place])  01/29/2015  . Closed fracture of fibula, shaft 01/21/2015  . Cellulitis 12/31/2014  . Abrasion of trunk 12/17/2014  . Change in bowel habits 12/10/2014  . Abnormal loss of weight 12/03/2014  . N&V (nausea and vomiting) 11/25/2014  . Diffuse lymphadenopathy 11/20/2014  . Can't get food down 11/19/2014  . Hypo-osmolality and hyponatremia 11/19/2014  . Decreased motor strength 11/19/2014  . Other specified problems related to primary support group 11/19/2014  . Hyponatremia 11/17/2014  . Pressure ulcer 11/14/2014  . Acute kidney injury superimposed on chronic kidney disease ([place]) 11/01/2014  . Acute renal failure ([place]) 11/01/2014  . Acute encephalopathy 10/31/2014  . Aspiration pneumonia ([place]) 10/31/2014  . Hypoglycemia 10/31/2014  . HTN (hypertension) 10/31/2014  . Type 2 diabetes mellitus ([place]) 10/31/2014  . CAD (coronary artery disease) 10/31/2014  . Brain disorder 10/31/2014  . Essential (primary) hypertension 10/31/2014  . Pneumonitis due to inhalation of food or vomitus ([place]) 10/31/2014  . Controlled type 2 diabetes mellitus without complication ([place]) [id number]  . Moderate episode of recurrent major depressive disorder ([place]) 08/30/2014  . Lumbar canal stenosis 07/03/2014  . Foot pain 07/02/2014  . DDD (degenerative disc disease), lumbar 06/24/2014  . Facet syndrome, lumbar 06/24/2014  . Spinal stenosis, lumbar region, with neurogenic claudication 06/24/2014  . Neuropathy due to secondary diabetes ([place]) 06/24/2014  . LBP (low back pain) 06/24/2014  . Degeneration of intervertebral disc of lumbar region 06/24/2014  . Dizziness and giddiness 04/07/2014  .  Cephalalgia 01/28/2014  . Better eye: total vision impairment, lesser eye: total vision impairment 11/25/2013  . H/O fall 11/25/2013  . Degeneration of intervertebral disc of lumbosacral region 03/20/2013  . Atherosclerosis of native artery of extremity ([place]) 11/05/2012  . Back pain, thoracic 09/06/2012  . Trochanteric  bursitis of right hip 08/27/2012  . Mild cognitive disorder 05/24/2012  . Arthritis, degenerative 01/30/2012  . Anxiety disorder 12/05/2011  . Absence of toe ([place]) 07/28/2011  . Age-related macular degeneration 07/28/2011  . CAD in native artery 07/28/2011  . Chronic kidney disease (CKD), stage III (moderate) 07/28/2011  . Glaucoma 07/28/2011  . HLD (hyperlipidemia) 07/28/2011  . Morbid obesity ([place]) 07/28/2011  . Obstructive apnea 07/28/2011  . H/O malignant neoplasm 07/28/2011  . Type 2 diabetes mellitus with peripheral angiopathy ([place]) 07/28/2011  . Absence of bladder continence 07/28/2011  . Avitaminosis D 07/28/2011      Prior to Admission medications   Medication Sig Start Date End Date Taking? Authorizing Provider  acetaminophen (TYLENOL) 500 MG tablet Take 500 mg by mouth every 8 (eight) hours as needed.    Historical Provider, MD  amLODipine (NORVASC) 5 MG tablet Take 5 mg by mouth daily.    Historical Provider, MD  aspirin EC 81 MG tablet Take 81 mg by mouth daily.    Historical Provider, MD  atorvastatin (LIPITOR) 80 MG tablet Take 80 mg by mouth daily.    Historical Provider, MD  brimonidine-timolol (COMBIGAN) 0.2-0.5 % ophthalmic solution Place 1 drop into both eyes every 12 (twelve) hours.    Historical Provider, MD  Carboxymethylcellul-Glycerin (OPTIVE) 0.5-0.9 % SOLN Apply 1-2 drops to eye 3 (three) times daily as needed.    Historical Provider, MD  chlorthalidone (HYGROTON) 25 MG tablet Take 25 mg by mouth daily.    Historical Provider, MD  citalopram (CELEXA) 20 MG tablet Take 20 mg by mouth daily.    Historical Provider, MD  docusate sodium (COLACE) 100 MG capsule Take 100 mg by mouth at bedtime as needed for mild constipation.    Historical Provider, MD  furosemide (LASIX) 40 MG tablet Take 1 tablet (40 mg total) by mouth daily. 11/20/14   Epifanio Lesches, MD  gabapentin (NEURONTIN) 300 MG capsule TAKE ONE CAPSULE BY MOUTH EVERY 12 HOURS FOR PAIN 12/13/14    Historical Provider, MD  insulin aspart (NOVOLOG) 100 UNIT/ML injection Inject 0-9 Units into the skin 3 (three) times daily with meals. 11/20/14   Epifanio Lesches, MD  lisinopril (PRINIVIL,ZESTRIL) 40 MG tablet Take 40 mg by mouth daily.    Historical Provider, MD  Multiple Vitamin (MULTI-VITAMINS) TABS Take 1 tablet by mouth.    Historical Provider, MD  omeprazole (PRILOSEC) 20 MG capsule Take 20 mg by mouth daily.    Historical Provider, MD  oxyCODONE (OXY IR/ROXICODONE) 5 MG immediate release tablet Take 1 tablet by mouth 4 (four) times daily.    Historical Provider, MD  polyethylene glycol (MIRALAX / GLYCOLAX) packet Take 17 g by mouth daily as needed.    Historical Provider, MD  polyethylene glycol-electrolytes (TRILYTE) 420 g solution Take 4,000 mLs by mouth as directed. Drink one 8 oz glass every 30 mins until stools are clear. 01/16/15   Lucilla Lame, MD  promethazine (PHENERGAN) 12.5 MG tablet Take 1 tablet by mouth.    Historical Provider, MD  sodium chloride 1 G tablet Take 1 tablet (1 g total) by mouth 2 (two) times daily with a meal. 11/20/14   Epifanio Lesches, MD  Travoprost, BAK Free, (  TRAVATAN) 0.004 % SOLN ophthalmic solution Place 1 drop into both eyes.    Historical Provider, MD  trolamine salicylate (ASPERCREME) 10 % cream Apply 1 application topically as needed for muscle pain (left hip).    Historical Provider, MD  Vitamin D, Ergocalciferol, (DRISDOL) 50000 UNITS CAPS capsule Take 50,000 Units by mouth every 30 (thirty) days.    Historical Provider, MD  Zinc Oxide (DESITIN) 13 % CREA Apply topically.    Historical Provider, MD    Allergies Penicillins    Social History Social History  Substance Use Topics  . Smoking status: Former Research scientist (life sciences)  . Smokeless tobacco: Never Used  . Alcohol use No    Review of Systems Unable to obtain due to acuity of condition ____________________________________________   PHYSICAL EXAM:  VITAL SIGNS: Vitals:   11/19/15 1159    BP: (!) 157/64  Pulse: (!) 106  Resp: 10    Constitutional: Patient arrives acutely ill. Head droop to the right side with sonorous respirations. Patient will open eyes to voice and follows simple 1 step commands. Eyes: Conjunctivae are normal. PERRL. EOMI. Head: Atraumatic. Nose: No congestion/rhinnorhea. Mouth/Throat: Mucous membranes are moist.  Oropharynx non-erythematous. Neck: No stridor. Painless ROM. No cervical spine tenderness to palpation Hematological/Lymphatic/Immunilogical: No cervical lymphadenopathy. Cardiovascular: Normal rate, regular rhythm. Grossly normal heart sounds.  Good peripheral circulation. Respiratory: sonorous respirations with normal rate.  Diminished bs bilaterally likely 2/2 body habitus Gastrointestinal: Soft and nontender. No distention. No abdominal bruits. No CVA tenderness. Musculoskeletal: No lower extremity tenderness, diffuse edema.  No joint effusions. Neurologic:  GCS (3,1,4) 8.  Right-sided facial droop. Complete neuro exam limited due to acute encephalopathy Skin:  Skin is warm, dry and intact. No rash noted.  ____________________________________________   LABS (all labs ordered are listed, but only abnormal results are displayed)  Results for orders placed or performed during the hospital encounter of 11/19/15 (from the past 24 hour(s))  Protime-INR     Status: None   Collection Time: 11/19/15 11:58 AM  Result Value Ref Range   Prothrombin Time 12.7 11.4 - 15.2 seconds   INR 0.95   APTT     Status: None   Collection Time: 11/19/15 11:58 AM  Result Value Ref Range   aPTT 26 24 - 36 seconds  CBC     Status: Abnormal   Collection Time: 11/19/15 11:58 AM  Result Value Ref Range   WBC 10.3 3.6 - 11.0 K/uL   RBC 3.54 (L) 3.80 - 5.20 MIL/uL   Hemoglobin 9.1 (L) 12.0 - 16.0 g/dL   HCT 29.5 (L) 35.0 - 47.0 %   MCV 83.2 80.0 - 100.0 fL   MCH 25.7 (L) 26.0 - 34.0 pg   MCHC 30.9 (L) 32.0 - 36.0 g/dL   RDW 17.3 (H) 11.5 - 14.5 %    Platelets 132 (L) 150 - 440 K/uL  Differential     Status: Abnormal   Collection Time: 11/19/15 11:58 AM  Result Value Ref Range   Neutrophils Relative % 56 %   Neutro Abs 5.7 1.4 - 6.5 K/uL   Lymphocytes Relative 35 %   Lymphs Abs 3.6 1.0 - 3.6 K/uL   Monocytes Relative 9 %   Monocytes Absolute 1.0 (H) 0.2 - 0.9 K/uL   Eosinophils Relative 0 %   Eosinophils Absolute 0.0 0 - 0.7 K/uL   Basophils Relative 0 %   Basophils Absolute 0.0 0 - 0.1 K/uL  Comprehensive metabolic panel     Status: Abnormal  Collection Time: 11/19/15 11:58 AM  Result Value Ref Range   Sodium 138 135 - 145 mmol/L   Potassium 4.9 3.5 - 5.1 mmol/L   Chloride 97 (L) 101 - 111 mmol/L   CO2 36 (H) 22 - 32 mmol/L   Glucose, Bld 83 65 - 99 mg/dL   BUN 23 (H) 6 - 20 mg/dL   Creatinine, Ser 1.00 0.44 - 1.00 mg/dL   Calcium 9.0 8.9 - 10.3 mg/dL   Total Protein 7.7 6.5 - 8.1 g/dL   Albumin 3.7 3.5 - 5.0 g/dL   AST 30 15 - 41 U/L   ALT 17 14 - 54 U/L   Alkaline Phosphatase 78 38 - 126 U/L   Total Bilirubin 0.7 0.3 - 1.2 mg/dL   GFR calc non Af Amer 54 (L) >60 mL/min   GFR calc Af Amer >60 >60 mL/min   Anion gap 5 5 - 15  Troponin I     Status: Abnormal   Collection Time: 11/19/15 11:58 AM  Result Value Ref Range   Troponin I 0.04 (HH) <0.03 ng/mL  Glucose, capillary     Status: None   Collection Time: 11/19/15 12:24 PM  Result Value Ref Range   Glucose-Capillary 72 65 - 99 mg/dL  Urinalysis complete, with microscopic ([facility] only)     Status: Abnormal   Collection Time: 11/19/15 12:31 PM  Result Value Ref Range   Color, Urine YELLOW (A) YELLOW   APPearance CLEAR (A) CLEAR   Glucose, UA NEGATIVE NEGATIVE mg/dL   Bilirubin Urine NEGATIVE NEGATIVE   Ketones, ur NEGATIVE NEGATIVE mg/dL   Specific Gravity, Urine 1.012 1.005 - 1.030   Hgb urine dipstick 2+ (A) NEGATIVE   pH 5.0 5.0 - 8.0   Protein, ur 100 (A) NEGATIVE mg/dL   Nitrite NEGATIVE NEGATIVE   Leukocytes, UA NEGATIVE NEGATIVE   RBC / HPF 6-30 0 - 5  RBC/hpf   WBC, UA 0-5 0 - 5 WBC/hpf   Bacteria, UA RARE (A) NONE SEEN   Squamous Epithelial / LPF 0-5 (A) NONE SEEN   Hyaline Casts, UA PRESENT   Blood gas, venous     Status: Abnormal (Preliminary result)   Collection Time: 11/19/15 12:46 PM  Result Value Ref Range   FIO2 100.00    Delivery systems NON-REBREATHER OXYGEN MASK    pH, Ven 7.28 7.250 - 7.430   pCO2, Ven 72 (HH) 44.0 - 60.0 mmHg   pO2, Ven 65.0 (H) 32.0 - 45.0 mmHg   Bicarbonate 33.8 (H) 20.0 - 28.0 mmol/L   Acid-Base Excess 6.1 (H) 0.0 - 2.0 mmol/L   O2 Saturation 89.5 %   Patient temperature 37.0    Collection site PENDING    Sample type VENOUS    ____________________________________________  EKG My review and personal interpretation at Time: 11:59   Indication: cva  Rate: 105  Rhythm: sinus Axis: normal Other: no acute ischemic changes ____________________________________________  RADIOLOGY  I personally reviewed all radiographic images ordered to evaluate for the above acute complaints and reviewed radiology reports and findings.  These findings were personally discussed with the patient.  Please see medical record for radiology report.  ____________________________________________   PROCEDURES  Procedure(s) performed: none Procedures    Critical Care performed: yes CRITICAL CARE Performed by: Merlyn Lot   Total critical care time: 50 minutes  Critical care time was exclusive of separately billable procedures and treating other patients.  Critical care was necessary to treat or prevent imminent or life-threatening deterioration.  Critical care was time spent personally by me on the following activities: development of treatment plan with patient and/or surrogate as well as nursing, discussions with consultants, evaluation of patient's response to treatment, examination of patient, obtaining history from patient or surrogate, ordering and performing treatments and interventions, ordering and  review of laboratory studies, ordering and review of radiographic studies, pulse oximetry and re-evaluation of patient's condition.  ____________________________________________   INITIAL IMPRESSION / ASSESSMENT AND PLAN / ED COURSE  Pertinent labs & imaging results that were available during my care of the patient were reviewed by me and considered in my medical decision making (see chart for details).  DDX: Dehydration, sepsis, pna, uti, hypoglycemia, cva, drug effect, withdrawal, encephalitis   FATIMAH KROLCZYK is a 75 y.o. who presents to the ED with with very complex past medical history with chief complaint of sore respirations altered mental status and right facial droop. Patient arrives hypertensive with snoring respirations which was resolved with nasal trumpet. She had adequate respirations with a normal pulse ox on nonrebreather mask. Patient was observed for several moments with a normal glucose.  She is not on any narcotics and there is no response with Narcan. Based on her history patient taken emergently to CT scanner for evaluation of probable CVA.  The patient will be placed on continuous pulse oximetry and telemetry for monitoring.  Laboratory evaluation will be sent to evaluate for the above complaints.     Clinical Course as of Nov 18 1408  Thu Nov 19, 2015  1227 CT head with no evidence of CVA  [PR]  1249 Patient reassessed and pulled out her nasal trumpet. She's now following commands and her encephalopathy seems to be improving. Blood work so far is reassuring. Etiology of her clinical presentation is uncertain at this time  [PR]  50 Blood work shows evidence of acute hypercapnic respiratory failure which would explain the patient's altered mental status with improvement after respiratory support.  No leukocytosis.  Mild temperature.  Possible underlying sepsis. Patient is placed on BiPAP. Patient will require admission for further evaluation and management.  [PR]  [id number]  She has elevation of her troponin which is likely secondary to demand ischemia. No evidence of ACS.  Patient with partially compensated hypercapnic respiratory failure.  Early tolerating BiPAP well at this time.  [PR]  1408 Patient reassessed and continues to improve and clear her encephalopathy further supporting a possible component of hypercapnia.  Possible TIA or CVA the patient has no anatomic deficits to be consistent with this process.  I spoke with Dr. Posey Pronto who kindly agrees to admit patient for further evaluation and management.[PR]    Clinical Course User Index [PR] Merlyn Lot, MD     ____________________________________________   FINAL CLINICAL IMPRESSION(S) / ED DIAGNOSES  Final diagnoses:  Acute respiratory failure with hypercapnia ([place])  Acute encephalopathy      NEW MEDICATIONS STARTED DURING THIS VISIT:  New Prescriptions   No medications on file     Note:  This document was prepared using Dragon voice recognition software and may include unintentional dictation errors.    Merlyn Lot, MD 11/19/15 [phone]

## 2015-11-19 NOTE — ED Notes (Signed)
Respiratory at bedside.

## 2015-11-19 NOTE — Consult Note (Signed)
Name: [name] MRN: [id number] DOB: 10/07/1940    ADMISSION DATE:  11/19/2015 CONSULTATION DATE:  11/19/15   REFERRING MD :  Dr. Fritzi Mandes  CHIEF COMPLAINT:  Unresponsive  BRIEF PATIENT DESCRIPTION: This is a 75 y.o. female admitted to [facility] 11/19/15 with acute hypoxic hypercapnic respiratory failure secondary to CHF exacerbation and acute encephalopathy secondary to acute CVA vs hypercapnia  SIGNIFICANT EVENTS  11/19/15>> Admission to [facility] 11/19/15>> PCCM consultation for further management of acute hypoxic hypercapnic respiratory failure  STUDIES:  11/16 CT Head>> No acute intracranial abnormalities. Mild to moderate atrophy and mild chronic microvascular ischemic change. 11/16 CXR>>  CHF with mild interstitial edema and small bilateral pleural effusions greatest on the right.  11/16 MRSA PCR>> Negative   HISTORY OF PRESENT ILLNESS:  This is a 75 y.o. Female with PMH of arthritis, DM without complication, GERD, Hyperlipidemia, HTN, MI, Non-Hodgkin's Lymphoma, Spinal stenosis, and TIA.  The patient presents to [facility] ED 11/19/15 with snoring respirations, minimally responsive, and concerns for worsening stroke like symptoms. The patient  was found unresponsive by nursing staff at [facility] 11/19/15, she was subsequently given Narcan with no significant improvement of mental status.  The patient's daughter reports that she had some left-sided weakness beginning Sunday evening 11/12, therefore [facility] gave the pt aspirin. Upon arrival to the ER CT Head was negative for acute stroke.  CXR concerning for acute CHF exacerbation.  Pt received 20 mg IV lasix x1 dose in ED, and placed on BiPAP.  PCCM consulted 11/16 for further management of acute hypoxic hypercapnic respiratory failure secondary to CHF exacerbation requiring Bipap and acute encephalopathy secondary to CVA vs hypercapnea.  PAST MEDICAL HISTORY :   has a past medical history of Arthritis; Diabetes  mellitus without complication ([place]); GERD (gastroesophageal reflux disease); HLD (hyperlipidemia); Hypertension; MI (myocardial infarction); Non Hodgkin's lymphoma ([place]) (2009); Spinal stenosis; and TIA (transient ischemic attack).  has a past surgical history that includes Abdominal hysterectomy; Toe amputation (Left); Eye surgery (Bilateral); Cardiac catheterization (N/A, 04/01/2015); and Cardiac catheterization (04/01/2015). Prior to Admission medications   Medication Sig Start Date End Date Taking? Authorizing Provider  aspirin EC 81 MG tablet Take 81 mg by mouth daily.   Yes Historical Provider, MD  brimonidine-timolol (COMBIGAN) 0.2-0.5 % ophthalmic solution Place 1 drop into both eyes every 12 (twelve) hours.   Yes Historical Provider, MD  Carboxymethylcellul-Glycerin (OPTIVE) 0.5-0.9 % SOLN Apply 1-2 drops to eye 3 (three) times daily as needed.   Yes Historical Provider, MD  ferrous sulfate 325 (65 FE) MG tablet Take 325 mg by mouth daily. Monday, Wednesday, friday   Yes Historical Provider, MD  fluticasone (FLONASE) 50 MCG/ACT nasal spray Place 1 spray into both nostrils daily.   Yes Historical Provider, MD  furosemide (LASIX) 40 MG tablet Take 1 tablet (40 mg total) by mouth daily. 11/20/14  Yes Epifanio Lesches, MD  gabapentin (NEURONTIN) 300 MG capsule TAKE ONE CAPSULE BY MOUTH EVERY 8 HOURS FOR PAIN 12/13/14  Yes Historical Provider, MD  insulin aspart (NOVOLOG) 100 UNIT/ML injection Inject 0-9 Units into the skin 3 (three) times daily with meals. Patient taking differently: Inject 0-9 Units into the skin 3 (three) times daily with meals. 0-99=0 units 100-149=2 units 150-199=3 units 200-249=4 units 250-299=5 units 300-349=6 units 350-399=7 units 400-449=8 units >/=450 notify md 11/20/14  Yes Epifanio Lesches, MD  insulin glargine (LANTUS) 100 UNIT/ML injection Inject 24 Units into the skin at bedtime.   Yes Historical  Provider, MD  ipratropium-albuterol (DUONEB) 0.5-2.5 (3)  MG/3ML SOLN Take 3 mLs by nebulization every 4 (four) hours as needed.   Yes Historical Provider, MD  lisinopril (PRINIVIL,ZESTRIL) 40 MG tablet Take 40 mg by mouth daily.   Yes Historical Provider, MD  loratadine (CLARITIN) 10 MG tablet Take 10 mg by mouth daily.   Yes Historical Provider, MD  Multiple Vitamin (MULTI-VITAMINS) TABS Take 1 tablet by mouth.   Yes Historical Provider, MD  oxyCODONE (OXY IR/ROXICODONE) 5 MG immediate release tablet Take 1 tablet by mouth 4 (four) times daily.   Yes Historical Provider, MD  polyethylene glycol (MIRALAX / GLYCOLAX) packet Take 17 g by mouth daily as needed.   Yes Historical Provider, MD  promethazine (PHENERGAN) 12.5 MG tablet Take 1 tablet by mouth.   Yes Historical Provider, MD  ranitidine (ZANTAC) 150 MG tablet Take 150 mg by mouth at bedtime.   Yes Historical Provider, MD  sodium chloride 1 G tablet Take 1 tablet (1 g total) by mouth 2 (two) times daily with a meal. 11/20/14  Yes Epifanio Lesches, MD  Vitamin D, Ergocalciferol, (DRISDOL) 50000 UNITS CAPS capsule Take 50,000 Units by mouth every 30 (thirty) days.   Yes Historical Provider, MD  Zinc Oxide (DESITIN) 13 % CREA Apply topically.   Yes Historical Provider, MD  acetaminophen (TYLENOL) 500 MG tablet Take 500 mg by mouth every 8 (eight) hours as needed.    Historical Provider, MD  amLODipine (NORVASC) 5 MG tablet Take 5 mg by mouth daily.    Historical Provider, MD  polyethylene glycol-electrolytes (TRILYTE) 420 g solution Take 4,000 mLs by mouth as directed. Drink one 8 oz glass every 30 mins until stools are clear. Patient not taking: Reported on 11/19/2015 01/16/15   Lucilla Lame, MD   Allergies  Allergen Reactions  . Penicillins Hives    FAMILY HISTORY:  family history includes Breast cancer in her sister; Diabetes in her father; Heart disease in her mother; Hypertension in her mother. SOCIAL HISTORY:  reports that she has quit smoking. She has never used smokeless tobacco. She  reports that she does not drink alcohol or use drugs.  REVIEW OF SYSTEMS:  Unable to obtain as pt is unresponsive on bipap  SUBJECTIVE:  Unable to obtain as pt is unresponsive on bipap  VITAL SIGNS: Temp:  [99 F (37.2 C)] 99 F (37.2 C) (11/16 1340) Pulse Rate:  [93-106] 95 (11/16 1500) Resp:  [10] 10 (11/16 1159) BP: (117-157)/(55-64) 139/60 (11/16 1500) SpO2:  [100 %] 100 % (11/16 1500) Weight:  [225 lb (102.1 kg)] 225 lb (102.1 kg) (11/16 1340)  PHYSICAL EXAMINATION: General:  Acutely ill appearing 75 y.o. African American Female, lying in bed unresponsive on BiPAP  Neuro:  Obtunded, withdraws to painful stimuli, Pupils 1 mm and sluggish bilaterally HEENT:  Atruamic, normocephalic, No supple, No JVD  Cardiovascular:  RRR, s1s2, No R/M/G., 2+ generalized edema, LUE 3+ edema Lungs:  Diminished throughout, symmetrical expansion, BiPAP assisted Abdomen:  Obese, soft, non-tender, non-distended, active bowel sounds all quandrants Musculoskeletal:  Normal bulk and tone Skin: No rashes, lesions, or ulcerations.   Recent Labs Lab 11/19/15 1158  NA 138  K 4.9  CL 97*  CO2 36*  BUN 23*  CREATININE 1.00  GLUCOSE 83    Recent Labs Lab 11/19/15 1158  HGB 9.1*  HCT 29.5*  WBC 10.3  PLT 132*   Dg Chest Portable 1 View  Result Date: 11/19/2015 CLINICAL DATA:  Unresponsive. History of coronary artery  disease, previous MI, non-Hodgkin's lymphoma, former smoker. EXAM: PORTABLE CHEST 1 VIEW COMPARISON:  Chest x-ray of November 15, 2014 FINDINGS: The lungs are adequately inflated. There are pleural effusions bilaterally greatest on the right. The interstitial markings are increased. The cardiac silhouette is enlarged. The pulmonary vascularity is mildly engorged. The mediastinum is subjectively widened. The bony thorax is unremarkable. IMPRESSION: CHF with mild interstitial edema and small bilateral pleural effusions greatest on the right. Electronically Signed   By: Noah Lembke  [place]  M.D.   On: 11/19/2015 13:24   Ct Head Code Stroke W/o Cm  Result Date: 11/19/2015 CLINICAL DATA:  Code stroke. Stroke symptoms. Possible right facial droop and right-sided weakness. History of non-Hodgkin's lymphoma. EXAM: CT HEAD WITHOUT CONTRAST TECHNIQUE: Contiguous axial images were obtained from the base of the skull through the vertex without intravenous contrast. COMPARISON:  01/20/2015 FINDINGS: Brain: There is no evidence of acute cortical infarct, intracranial hemorrhage, mass, midline shift, or extra-axial fluid collection. Mild cerebral atrophy is unchanged and within normal limits for age. Periventricular white matter hypodensities are unchanged and nonspecific but compatible with mild chronic small vessel ischemic disease. Vascular: Calcified atherosclerosis at the skullbase. No hyperdense vessel. Skull: No fracture or focal osseous lesion. Sinuses/Orbits: Prior bilateral cataract extraction. Near complete opacification of the ethmoid sinuses. Mucosal thickening and fluid in the sphenoid greater than frontal sinuses. Large amount of right maxillary sinus fluid. Other: Partially visualize nasal airway. ASPECTS [facility] Stroke Program Early CT Score) - Ganglionic level infarction (caudate, lentiform nuclei, internal capsule, insula, M1-M3 cortex): 7 - Supraganglionic infarction (M4-M6 cortex): 3 Total score (0-10 with 10 being normal): 10 IMPRESSION: 1. No evidence of acute intracranial abnormality. 2. ASPECTS is 10. 3. Mild chronic small vessel ischemic disease. These results were called by telephone at the time of interpretation on 11/19/2015 at 12:26 pm to Dr. Merlyn Lot , who verbally acknowledged these results. Electronically Signed   By: Logan Bores M.D.   On: 11/19/2015 12:27    ASSESSMENT / PLAN:  PULMONARY A: Acute hypoxic hypercapnic respiratory failure secondary to CHF exacerbation  Acute encephalopathy secondary to CVA vs hypercapnea  P:  Continue IV Lasix Bipap, wean  as tolerated Maintain O2 sats >92% Prn bronchodilators  CXR in am Prn ABG's  MRI once pt off Bipap Consider repeat CT head in the next 24 to 48 hrs if unable to wean off Bipap Lights on during the day Promote family presence at the bedside Avoid sedating medication Provide cessation counseling for smokeless tobacco once pts mental status improves  Update: Spoke with pts daughter and pts sister about plan of care and all questions answered.  According to pts daughter and pts sister the patient will remain a DO NOT RESUSCITATE   Marda Stalker, [place] Pager [phone] (please enter 7 digits) [place] Pager [phone] (please enter 7 digits)   Merton Border, MD PCCM service Mobile [phone] Pager [phone] 11/20/2015

## 2015-11-19 NOTE — ED Notes (Signed)
Receiving nurse, Chrys Racer notified of 66 blood glucose  And dextrose 10% infusion on board.

## 2015-11-19 NOTE — ED Triage Notes (Addendum)
75 y/o female arrived via ems W/DNR at bedside from [facility] . Last normal 10 am , RN at facility check at 11 am pt found unresponsive/drowsy/snoring. Pt given 2 Narcan intranasally

## 2015-11-19 NOTE — Progress Notes (Signed)
[place] on-call asked [place] to visit the Pt he had seen in ED 13 and provide emotional support for the daughter. [place] visited the Pt, who is a critical Pt, and he spoke with daughter who was with the Pt in the room. [place] asked daughter if she needed any help and she told Ch, she did not need any help. [place] informed Pt's daughter that if needed, [facility] was available.    11/19/15 1400  Clinical Encounter Type  Visited With Patient;Patient and family together  Visit Type Initial;ED;Trauma  Referral From Chaplain  Consult/Referral To Chaplain  Spiritual Encounters  Spiritual Needs Prayer;Emotional;Other (Comment)

## 2015-11-19 NOTE — ED Notes (Signed)
Notified MD of glucose 66; unable to release hypoglycemic orders. MD to give verbal orders. Will call receiving nurse of delay

## 2015-11-19 NOTE — H&P (Signed)
[facility] [place] NAME: [name]    MR#:  [id number]  DATE OF BIRTH:  1940-11-15  DATE OF ADMISSION:  11/19/2015  PRIMARY CARE PHYSICIAN: Pcp Not In System   REQUESTING/REFERRING PHYSICIAN: Dr Quentin Cornwall  CHIEF COMPLAINT:   Patient was found unresponsive at [facility] Daughter in the room give some history. Chart reviewed spoke with ER physician Dr. Quentin Cornwall HISTORY OF PRESENT ILLNESS:  [name]  is a 75 y.o. female with a known history of MI, hypertension, GERD, diabetes who is a resident of [facility] comes to the emergency room after she was found unresponsive by nursing home staff. Patient had not taken her narcotics in a few weeks. She was given Narcan without any response. She was found to have left-sided weakness according to the daughter. This weakness was noted Sunday evening and was started on some aspirin. Came to the emergency room she was found to be hypoxic and unresponsive. She was immediately placed on a BiPAP. Workup in the emergency room showed acute hypoxic hypercarbic respiratory failure with CHF and acute encephalopathy likely due to hypercarbia. CT head negative for acute stroke. Unable to do MRI since patient is on BiPAP. I have ordered 20 mg of IV Lasix. Daughter in the room answers most of my questions. Patient is not much active but able to feed herself. She did not have left-sided weakness couple days ago. Patient is a DO NOT RESUSCITATE and this was confirmed by daughter she carries a yellow DO NOT RESUSCITATE form.  PAST MEDICAL HISTORY:   Past Medical History:  Diagnosis Date  . Arthritis   . Diabetes mellitus without complication ([place])   . GERD (gastroesophageal reflux disease)   . HLD (hyperlipidemia)   . Hypertension   . MI (myocardial infarction)    1990s  . Non Hodgkin's lymphoma ([place]) 2009   s/p stem cell transplant  . Spinal stenosis   . TIA (transient ischemic attack)      PAST SURGICAL HISTOIRY:   Past Surgical History:  Procedure Laterality Date  . ABDOMINAL HYSTERECTOMY    . EYE SURGERY Bilateral   . PERIPHERAL VASCULAR CATHETERIZATION N/A 04/01/2015   Procedure: Abdominal Aortogram w/Lower Extremity;  Surgeon: Katha Cabal, MD;  Location: [place] [facility];  Service: Cardiovascular;  Laterality: N/A;  . PERIPHERAL VASCULAR CATHETERIZATION  04/01/2015   Procedure: Lower Extremity Intervention;  Surgeon: Katha Cabal, MD;  Location: [place] [facility];  Service: Cardiovascular;;  . TOE AMPUTATION Left     SOCIAL HISTORY:   Social History  Substance Use Topics  . Smoking status: Former Research scientist (life sciences)  . Smokeless tobacco: Never Used  . Alcohol use No    FAMILY HISTORY:   Family History  Problem Relation Age of Onset  . Hypertension Mother   . Heart disease Mother   . Diabetes Father   . Breast cancer Sister     DRUG ALLERGIES:   Allergies  Allergen Reactions  . Penicillins Hives    REVIEW OF SYSTEMS:  Review of Systems  Unable to perform ROS: Mental status change     MEDICATIONS AT HOME:   Prior to Admission medications   Medication Sig Start Date End Date Taking? Authorizing Provider  aspirin EC 81 MG tablet Take 81 mg by mouth daily.   Yes Historical Provider, MD  brimonidine-timolol (COMBIGAN) 0.2-0.5 % ophthalmic solution Place 1 drop into both eyes every 12 (twelve) hours.   Yes  Historical Provider, MD  Carboxymethylcellul-Glycerin (OPTIVE) 0.5-0.9 % SOLN Apply 1-2 drops to eye 3 (three) times daily as needed.   Yes Historical Provider, MD  ferrous sulfate 325 (65 FE) MG tablet Take 325 mg by mouth daily. Monday, Wednesday, friday   Yes Historical Provider, MD  fluticasone (FLONASE) 50 MCG/ACT nasal spray Place 1 spray into both nostrils daily.   Yes Historical Provider, MD  furosemide (LASIX) 40 MG tablet Take 1 tablet (40 mg total) by mouth daily. 11/20/14  Yes Epifanio Lesches, MD  gabapentin (NEURONTIN) 300  MG capsule TAKE ONE CAPSULE BY MOUTH EVERY 8 HOURS FOR PAIN 12/13/14  Yes Historical Provider, MD  insulin aspart (NOVOLOG) 100 UNIT/ML injection Inject 0-9 Units into the skin 3 (three) times daily with meals. Patient taking differently: Inject 0-9 Units into the skin 3 (three) times daily with meals. 0-99=0 units 100-149=2 units 150-199=3 units 200-249=4 units 250-299=5 units 300-349=6 units 350-399=7 units 400-449=8 units >/=450 notify md 11/20/14  Yes Epifanio Lesches, MD  insulin glargine (LANTUS) 100 UNIT/ML injection Inject 24 Units into the skin at bedtime.   Yes Historical Provider, MD  ipratropium-albuterol (DUONEB) 0.5-2.5 (3) MG/3ML SOLN Take 3 mLs by nebulization every 4 (four) hours as needed.   Yes Historical Provider, MD  lisinopril (PRINIVIL,ZESTRIL) 40 MG tablet Take 40 mg by mouth daily.   Yes Historical Provider, MD  loratadine (CLARITIN) 10 MG tablet Take 10 mg by mouth daily.   Yes Historical Provider, MD  Multiple Vitamin (MULTI-VITAMINS) TABS Take 1 tablet by mouth.   Yes Historical Provider, MD  oxyCODONE (OXY IR/ROXICODONE) 5 MG immediate release tablet Take 1 tablet by mouth 4 (four) times daily.   Yes Historical Provider, MD  polyethylene glycol (MIRALAX / GLYCOLAX) packet Take 17 g by mouth daily as needed.   Yes Historical Provider, MD  promethazine (PHENERGAN) 12.5 MG tablet Take 1 tablet by mouth.   Yes Historical Provider, MD  ranitidine (ZANTAC) 150 MG tablet Take 150 mg by mouth at bedtime.   Yes Historical Provider, MD  sodium chloride 1 G tablet Take 1 tablet (1 g total) by mouth 2 (two) times daily with a meal. 11/20/14  Yes Epifanio Lesches, MD  Vitamin D, Ergocalciferol, (DRISDOL) 50000 UNITS CAPS capsule Take 50,000 Units by mouth every 30 (thirty) days.   Yes Historical Provider, MD  Zinc Oxide (DESITIN) 13 % CREA Apply topically.   Yes Historical Provider, MD  acetaminophen (TYLENOL) 500 MG tablet Take 500 mg by mouth every 8 (eight) hours as  needed.    Historical Provider, MD  amLODipine (NORVASC) 5 MG tablet Take 5 mg by mouth daily.    Historical Provider, MD  polyethylene glycol-electrolytes (TRILYTE) 420 g solution Take 4,000 mLs by mouth as directed. Drink one 8 oz glass every 30 mins until stools are clear. Patient not taking: Reported on 11/19/2015 01/16/15   Lucilla Lame, MD      VITAL SIGNS:  Blood pressure (!) 157/64, pulse (!) 106, temperature 99 F (37.2 C), temperature source Rectal, resp. rate 10, height 5\' 6"  (1.676 m), weight 102.1 kg (225 lb), SpO2 100 %.  PHYSICAL EXAMINATION:  GENERAL:  75 y.o.-year-old patient lying in the bed with no acute distress. Obese appears critically ill. Nonresponsive. Currently on BiPAP. EYES: Pupils equal, round, reactive to light and accommodation. No scleral icterus. Extraocular muscles intact.  HEENT: Head atraumatic, normocephalic. Oropharynx and nasopharynx clear. On BiPAP NECK:  Supple, no jugular venous distention. No thyroid enlargement, no tenderness.  LUNGS:  Distant face appears puffy breath sounds bilaterally, no wheezing, rales,rhonchi or crepitation. No use of accessory muscles of respiration.  CARDIOVASCULAR: S1, S2 normal. No murmurs, rubs, or gallops.  ABDOMEN: Soft, nontender, nondistended. Bowel sounds present. No organomegaly or mass.  EXTREMITIES: 1+  pedal edema, no cyanosis, or clubbing.  NEUROLOGIC: Cranial nerves II through XII are intact. Muscle strength 5/5 in all extremities. Sensation intact. Gait not checked.  PSYCHIATRIC: Unresponsive moans and groans to painful stimuli SKIN: No obvious rash, lesion, or ulcer.   LABORATORY PANEL:   CBC  Recent Labs Lab 11/19/15 1158  WBC 10.3  HGB 9.1*  HCT 29.5*  PLT 132*   ------------------------------------------------------------------------------------------------------------------  Chemistries   Recent Labs Lab 11/19/15 1158  NA 138  K 4.9  CL 97*  CO2 36*  GLUCOSE 83  BUN 23*  CREATININE  1.00  CALCIUM 9.0  AST 30  ALT 17  ALKPHOS 78  BILITOT 0.7   ------------------------------------------------------------------------------------------------------------------  Cardiac Enzymes  Recent Labs Lab 11/19/15 1158  TROPONINI 0.04*   ------------------------------------------------------------------------------------------------------------------  RADIOLOGY:  Dg Chest Portable 1 View  Result Date: 11/19/2015 CLINICAL DATA:  Unresponsive. History of coronary artery disease, previous MI, non-Hodgkin's lymphoma, former smoker. EXAM: PORTABLE CHEST 1 VIEW COMPARISON:  Chest x-ray of November 15, 2014 FINDINGS: The lungs are adequately inflated. There are pleural effusions bilaterally greatest on the right. The interstitial markings are increased. The cardiac silhouette is enlarged. The pulmonary vascularity is mildly engorged. The mediastinum is subjectively widened. The bony thorax is unremarkable. IMPRESSION: CHF with mild interstitial edema and small bilateral pleural effusions greatest on the right. Electronically Signed   By: David  Martinique M.D.   On: 11/19/2015 13:24   Ct Head Code Stroke W/o Cm  Result Date: 11/19/2015 CLINICAL DATA:  Code stroke. Stroke symptoms. Possible right facial droop and right-sided weakness. History of non-Hodgkin's lymphoma. EXAM: CT HEAD WITHOUT CONTRAST TECHNIQUE: Contiguous axial images were obtained from the base of the skull through the vertex without intravenous contrast. COMPARISON:  01/20/2015 FINDINGS: Brain: There is no evidence of acute cortical infarct, intracranial hemorrhage, mass, midline shift, or extra-axial fluid collection. Mild cerebral atrophy is unchanged and within normal limits for age. Periventricular white matter hypodensities are unchanged and nonspecific but compatible with mild chronic small vessel ischemic disease. Vascular: Calcified atherosclerosis at the skullbase. No hyperdense vessel. Skull: No fracture or focal  osseous lesion. Sinuses/Orbits: Prior bilateral cataract extraction. Near complete opacification of the ethmoid sinuses. Mucosal thickening and fluid in the sphenoid greater than frontal sinuses. Large amount of right maxillary sinus fluid. Other: Partially visualize nasal airway. ASPECTS [facility] Stroke Program Early CT Score) - Ganglionic level infarction (caudate, lentiform nuclei, internal capsule, insula, M1-M3 cortex): 7 - Supraganglionic infarction (M4-M6 cortex): 3 Total score (0-10 with 10 being normal): 10 IMPRESSION: 1. No evidence of acute intracranial abnormality. 2. ASPECTS is 10. 3. Mild chronic small vessel ischemic disease. These results were called by telephone at the time of interpretation on 11/19/2015 at 12:26 pm to Dr. Merlyn Lot , who verbally acknowledged these results. Electronically Signed   By: Logan Bores M.D.   On: 11/19/2015 12:27    EKG:    IMPRESSION AND PLAN:   [name]  is a 75 y.o. female with a known history of MI, hypertension, GERD, diabetes who is a resident of [facility] comes to the emergency room after she was found unresponsive by nursing home staff. Patient had not taken her narcotics in a few weeks. She was  given Narcan without any response. She was found to have left-sided weakness according to the daughter. This weakness was noted Sunday evening and was started on some aspirin. Came to the emergency room she was found to be hypoxic and unresponsive. She was immediately placed on a BiPAP. Workup in the emergency room showed acute hypoxic hypercarbic respiratory failure with CHF and acute encephalopathy likely due to hypercarbia.   1. acute encephalopathy -Etiology clear except patient has hypercarbia hypoxic which could be causing her to be unresponsiv-CT head negative for acute stroke -Unable to do MRI since she is on BiPAP at present -Pending ammonia level -We'll admit to ICU stepdown -Patient is a DO NOT RESUSCITATE this was confirmed  with daughter she carries a yellow out of facility DO NOT RESUSCITATE for  2. Acute hypoxic hypercarbic respiratory failure secondary to congestive heart failure acute diastolic with possible COPD although no formal diagnosis of it. Patient has been a former smoker -IV Lasix 20 mg twice a day -Echo in the past showed EF of 65% echo was done in 2016 I will not repeat it -Cardiac consultation if needed -Pulmonary consultation  3. Left-sided weakness per family -CT head negative for stroke -MRI will be done at a later date once of BiPAP -Per rectal aspirin -Carotid Doppler once off BiPAP  3. Diabetes -Sliding scale insulin  4. History of MI -Continue aspirin -We'll resume cardiac meds once patient is awake and alert  5. DVT prophylaxis subcutaneous Lovenox  Patient is critically ill. Case was discussed with pulmonary. Are aware of critical illness.   All the records are reviewed and case discussed with ED provider. Management plans discussed with the patient, family and they are in agreement.  CODE STATUS: DO NOT RESUSCITATE  TOTAL CRITICAL TIME TAKING CARE OF THIS PATIENT: 55 minutes.    [name] M.D on 11/19/2015 at 2:42 PM  Between 7am to 6pm - Pager - [phone]  After 6pm go to [email or web address] - password [email or web address] [facility]  Office  [phone]  CC: Primary care physician; Pcp Not In System

## 2015-11-20 ENCOUNTER — Inpatient Hospital Stay: Payer: Medicare Other

## 2015-11-20 ENCOUNTER — Inpatient Hospital Stay: Admit: 2015-11-20 | Payer: Medicare Other

## 2015-11-20 DIAGNOSIS — E662 Morbid (severe) obesity with alveolar hypoventilation: Secondary | ICD-10-CM

## 2015-11-20 DIAGNOSIS — R55 Syncope and collapse: Secondary | ICD-10-CM

## 2015-11-20 DIAGNOSIS — J9621 Acute and chronic respiratory failure with hypoxia: Secondary | ICD-10-CM

## 2015-11-20 DIAGNOSIS — J9622 Acute and chronic respiratory failure with hypercapnia: Secondary | ICD-10-CM

## 2015-11-20 LAB — BLOOD GAS, VENOUS
ACID-BASE EXCESS: 6.1 mmol/L — AB (ref 0.0–2.0)
Bicarbonate: 33.8 mmol/L — ABNORMAL HIGH (ref 20.0–28.0)
FIO2: 100
O2 SAT: 89.5 %
PATIENT TEMPERATURE: 37
PCO2 VEN: 72 mmHg — AB (ref 44.0–60.0)
pH, Ven: 7.28 (ref 7.250–7.430)
pO2, Ven: 65 mmHg — ABNORMAL HIGH (ref 32.0–45.0)

## 2015-11-20 LAB — GLUCOSE, CAPILLARY
GLUCOSE-CAPILLARY: 85 mg/dL (ref 65–99)
GLUCOSE-CAPILLARY: 99 mg/dL (ref 65–99)
GLUCOSE-CAPILLARY: 93 mg/dL (ref 65–99)
GLUCOSE-CAPILLARY: 125 mg/dL — AB (ref 65–99)
Glucose-Capillary: 80 mg/dL (ref 65–99)
Glucose-Capillary: 112 mg/dL — ABNORMAL HIGH (ref 65–99)
Glucose-Capillary: 125 mg/dL — ABNORMAL HIGH (ref 65–99)

## 2015-11-20 LAB — BASIC METABOLIC PANEL
ANION GAP: 6 (ref 5–15)
BUN: 26 mg/dL — ABNORMAL HIGH (ref 6–20)
CHLORIDE: 98 mmol/L — AB (ref 101–111)
CO2: 33 mmol/L — ABNORMAL HIGH (ref 22–32)
Calcium: 8.5 mg/dL — ABNORMAL LOW (ref 8.9–10.3)
Creatinine, Ser: 1.14 mg/dL — ABNORMAL HIGH (ref 0.44–1.00)
GFR, EST AFRICAN AMERICAN: 54 mL/min — AB (ref >60)
GFR, EST NON AFRICAN AMERICAN: 46 mL/min — AB (ref >60)
Glucose, Bld: 71 mg/dL (ref 65–99)
POTASSIUM: 4.5 mmol/L (ref 3.5–5.1)
SODIUM: 137 mmol/L (ref 135–145)

## 2015-11-20 LAB — MRSA PCR SCREENING: MRSA by PCR: NEGATIVE

## 2015-11-20 MED ORDER — GUAIFENESIN 100 MG/5ML PO SOLN
5.0000 mL | ORAL | Status: DC | PRN
  Administered 2015-11-21 – 2015-11-22 (×2): 100 mg via ORAL
  Filled 2015-11-20 (×4): qty 5

## 2015-11-20 MED ORDER — FUROSEMIDE 40 MG PO TABS
40.0000 mg | ORAL_TABLET | Freq: Every day | ORAL | Status: DC
  Administered 2015-11-21 – 2015-11-24 (×4): 40 mg via ORAL
  Filled 2015-11-20 (×4): qty 1

## 2015-11-20 MED ORDER — ASPIRIN EC 81 MG PO TBEC
81.0000 mg | DELAYED_RELEASE_TABLET | Freq: Every day | ORAL | Status: DC
  Administered 2015-11-21 – 2015-11-24 (×4): 81 mg via ORAL
  Filled 2015-11-20 (×4): qty 1

## 2015-11-20 MED ORDER — GABAPENTIN 100 MG PO CAPS
200.0000 mg | ORAL_CAPSULE | Freq: Three times a day (TID) | ORAL | Status: DC
  Administered 2015-11-20: 200 mg via ORAL
  Filled 2015-11-20 (×2): qty 2

## 2015-11-20 MED ORDER — LISINOPRIL 20 MG PO TABS
20.0000 mg | ORAL_TABLET | Freq: Every day | ORAL | Status: DC
  Administered 2015-11-20: 20 mg via ORAL
  Filled 2015-11-20: qty 1

## 2015-11-20 MED ORDER — GUAIFENESIN-DM 100-10 MG/5ML PO SYRP
5.0000 mL | ORAL_SOLUTION | ORAL | Status: DC | PRN
  Administered 2015-11-20: 5 mL via ORAL
  Filled 2015-11-20: qty 5

## 2015-11-20 MED ORDER — BENZONATATE 100 MG PO CAPS
100.0000 mg | ORAL_CAPSULE | Freq: Three times a day (TID) | ORAL | Status: DC | PRN
  Administered 2015-11-21 – 2015-11-24 (×3): 100 mg via ORAL
  Filled 2015-11-20 (×3): qty 1

## 2015-11-20 MED ORDER — FAMOTIDINE 20 MG PO TABS
20.0000 mg | ORAL_TABLET | Freq: Every day | ORAL | Status: DC
  Administered 2015-11-20 – 2015-11-24 (×5): 20 mg via ORAL
  Filled 2015-11-20 (×5): qty 1

## 2015-11-20 NOTE — Plan of Care (Signed)
Problem: Education: Goal: Knowledge of [facility] Education information/materials will improve Outcome: Progressing BP (!) 141/99   Pulse 88   Temp 99 F (37.2 C) (Axillary)   Resp 17   Ht 5\' 6"  (1.676 m)   Wt 102.1 kg (225 lb)   SpO2 94%   BMI 36.32 kg/m   Pt now on nasal canula  Left arm edema 4+  Missing left 2.3.4 toes  Foley in place  EJ in place- D10 @50ml /hr  Foam to sacrum  Last bm 11/20/15  Hx. Of stroke left arm weakness

## 2015-11-20 NOTE — Progress Notes (Signed)
Inpatient Diabetes Program Recommendations  AACE/ADA: New Consensus Statement on Inpatient Glycemic Control (2015)  Target Ranges:  Prepandial:   less than 140 mg/dL      Peak postprandial:   less than 180 mg/dL (1-2 hours)      Critically ill patients:  140 - 180 mg/dL   Lab Results  Component Value Date   GLUCAP 125 (H) 11/20/2015   HGBA1C 6.2 (H) 11/01/2014    Review of Glycemic Control  Results for LANINA, OFFUTT (MRN [id number]) as of 11/20/2015 15:52  Ref. Range 11/20/2015 03:38 11/20/2015 06:05 11/20/2015 08:57 11/20/2015 12:20 11/20/2015 14:42  Glucose-Capillary Latest Ref Range: 65 - 99 mg/dL 80 85 99 93 125 (H)    Diabetes history: Type 2 Outpatient Diabetes medications: Lantus 24 units qhs, Novolog sliding scale tid with meals Current orders for Inpatient glycemic control: Novolog 0-9 units tid with meals  Inpatient Diabetes Program Recommendations:  If CBG >180mg /dl consistently, consider starting 1/ basal, Lantus 12 units qhs.  Gentry Fitz, RN, BA, MHA, CDE Diabetes Coordinator Inpatient Glycemic Control Team [phone] (Team Pager) [phone] ([place]) 11/20/2015 4:01 PM

## 2015-11-20 NOTE — NC FL2 (Signed)
[place] LEVEL OF CARE SCREENING TOOL     IDENTIFICATION  Patient Name: [name] Birthdate: 07-17-1940 Sex: female Admission Date (Current Location): 11/19/2015  [place] and [place] Number:  Engineering geologist and Address:  [facility], [address], [place], [place] [address]      Provider Number: [id number]  Attending Physician Name and Address:  Harvie Bridge, DO  Relative Name and Phone Number:       Current Level of Care: Hospital Recommended Level of Care: [place] Prior Approval Number:    Date Approved/Denied:   PASRR Number: [id number]  Discharge Plan: SNF    Current Diagnoses: Patient Active Problem List   Diagnosis Date Noted  . Encephalopathy acute 11/19/2015  . History of organ or tissue transplant 02/25/2015  . Osteomyelitis ([place]) 01/29/2015  . Closed fracture of fibula, shaft 01/21/2015  . Cellulitis 12/31/2014  . Abrasion of trunk 12/17/2014  . Change in bowel habits 12/10/2014  . Abnormal loss of weight 12/03/2014  . N&V (nausea and vomiting) 11/25/2014  . Diffuse lymphadenopathy 11/20/2014  . Can't get food down 11/19/2014  . Hypo-osmolality and hyponatremia 11/19/2014  . Decreased motor strength 11/19/2014  . Other specified problems related to primary support group 11/19/2014  . Hyponatremia 11/17/2014  . Pressure ulcer 11/14/2014  . Acute kidney injury superimposed on chronic kidney disease ([place]) 11/01/2014  . Acute renal failure ([place]) 11/01/2014  . Acute encephalopathy 10/31/2014  . Aspiration pneumonia ([place]) 10/31/2014  . Hypoglycemia 10/31/2014  . HTN (hypertension) 10/31/2014  . Type 2 diabetes mellitus ([place]) 10/31/2014  . CAD (coronary artery disease) 10/31/2014  . Brain disorder 10/31/2014  . Essential (primary) hypertension 10/31/2014  . Pneumonitis due to inhalation of food or vomitus ([place]) 10/31/2014  . Controlled type 2 diabetes mellitus without  complication ([place]) [id number]  . Moderate episode of recurrent major depressive disorder ([place]) 08/30/2014  . Lumbar canal stenosis 07/03/2014  . Foot pain 07/02/2014  . DDD (degenerative disc disease), lumbar 06/24/2014  . Facet syndrome, lumbar 06/24/2014  . Spinal stenosis, lumbar region, with neurogenic claudication 06/24/2014  . Neuropathy due to secondary diabetes ([place]) 06/24/2014  . LBP (low back pain) 06/24/2014  . Degeneration of intervertebral disc of lumbar region 06/24/2014  . Dizziness and giddiness 04/07/2014  . Cephalalgia 01/28/2014  . Better eye: total vision impairment, lesser eye: total vision impairment 11/25/2013  . H/O fall 11/25/2013  . Degeneration of intervertebral disc of lumbosacral region 03/20/2013  . Atherosclerosis of native artery of extremity ([place]) 11/05/2012  . Back pain, thoracic 09/06/2012  . Trochanteric bursitis of right hip 08/27/2012  . Mild cognitive disorder 05/24/2012  . Arthritis, degenerative 01/30/2012  . Anxiety disorder 12/05/2011  . Absence of toe ([place]) 07/28/2011  . Age-related macular degeneration 07/28/2011  . CAD in native artery 07/28/2011  . Chronic kidney disease (CKD), stage III (moderate) 07/28/2011  . Glaucoma 07/28/2011  . HLD (hyperlipidemia) 07/28/2011  . Morbid obesity ([place]) 07/28/2011  . Obstructive apnea 07/28/2011  . H/O malignant neoplasm 07/28/2011  . Type 2 diabetes mellitus with peripheral angiopathy ([place]) 07/28/2011  . Absence of bladder continence 07/28/2011  . Avitaminosis D 07/28/2011    Orientation RESPIRATION BLADDER Height & Weight     Self  Normal Continent Weight: 225 lb (102.1 kg) Height:  5\' 6"  (167.6 cm)  BEHAVIORAL SYMPTOMS/MOOD NEUROLOGICAL BOWEL NUTRITION STATUS   (none)  (no) Incontinent Diet (heart healthy/carb modified)  AMBULATORY STATUS COMMUNICATION OF NEEDS Skin  Total Care   Normal                       Personal Care Assistance Level of Assistance  Total care        Total Care Assistance: Maximum assistance   Functional Limitations Info             SPECIAL CARE FACTORS FREQUENCY                       Contractures Contractures Info: Not present    Additional Factors Info  Code Status, Allergies Code Status Info: dnr Allergies Info: pcn           Current Medications (11/20/2015):  This is the current hospital active medication list Current Facility-Administered Medications  Medication Dose Route Frequency Provider Last Rate Last Dose  . acetaminophen (TYLENOL) tablet 650 mg  650 mg Oral Q6H PRN Fritzi Mandes, MD      . albuterol (PROVENTIL) (2.5 MG/3ML) 0.083% nebulizer solution 2.5 mg  2.5 mg Nebulization Q6H PRN Fritzi Mandes, MD      . aspirin EC tablet 81 mg  81 mg Oral Daily Wilhelmina Mcardle, MD      . dextrose 10 % infusion   Intravenous Continuous Wilhelmina Mcardle, MD   Stopped at 11/20/15 1212  . enoxaparin (LOVENOX) injection 40 mg  40 mg Subcutaneous Q24H Fritzi Mandes, MD   40 mg at 11/19/15 2120  . famotidine (PEPCID) tablet 20 mg  20 mg Oral Daily Wilhelmina Mcardle, MD   20 mg at 11/20/15 1232  . [START ON 11/21/2015] furosemide (LASIX) tablet 40 mg  40 mg Oral Daily Wilhelmina Mcardle, MD      . gabapentin (NEURONTIN) capsule 200 mg  200 mg Oral TID Wilhelmina Mcardle, MD   200 mg at 11/20/15 1232  . lisinopril (PRINIVIL,ZESTRIL) tablet 20 mg  20 mg Oral Daily Wilhelmina Mcardle, MD   20 mg at 11/20/15 1232  . sodium chloride flush (NS) 0.9 % injection 3 mL  3 mL Intravenous Q12H Fritzi Mandes, MD   3 mL at 11/20/15 0803     Discharge Medications: Please see discharge summary for a list of discharge medications.  Relevant Imaging Results:  Relevant Lab Results:   Additional Information    Shela Leff, LCSW

## 2015-11-20 NOTE — Plan of Care (Signed)
Pt is on room air  Possible transfer to floor today

## 2015-11-20 NOTE — Clinical Social Work Note (Signed)
Clinical Social Work Assessment  Patient Details  Name: [name] MRN: [id number] Date of Birth: 1940-03-18  Date of referral:  11/20/15               Reason for consult:  Facility Placement                Permission sought to share information with:  Facility Sport and exercise psychologist, Family Supports Permission granted to share information::  Yes, Verbal Permission Granted  Name::        Agency::     Relationship::     Contact Information:     Housing/Transportation Living arrangements for the past 2 months:  [place] of Information:  Patient (Sibling) Patient Interpreter Needed:  None Criminal Activity/Legal Involvement Pertinent to Current Situation/Hospitalization:  No - Comment as needed Significant Relationships:  Siblings Lives with:    Do you feel safe going back to the place where you live?  Yes Need for family participation in patient care:  Yes (Comment)  Care giving concerns:  Patient is a long term care resident at [facility].    Social Worker assessment / plan:  CSW met with patient and her sister this afternoon in her ICU room. Patient gave permission to speak to her in front of her sister. Patient confirmed she is from [facility] and that she has been there for almost a year. She states she wishes she could return home at some point but knows she cannot right now. Patient states she will return to [facility] when she is discharged. CSW called Tiffany at [facility] and they will take patient back. Patient was very jovial with CSW and was smiling and laughing. Her sister was also very pleasant.   Employment status:  Retired Forensic scientist:  Medicare PT Recommendations:    Information / Referral to community resources:     Patient/Family's Response to care:  Patient and sister expressed appreciation for CSW assistance.  Patient/Family's Understanding of and Emotional Response to Diagnosis, Current Treatment, and Prognosis:  Patient  stated she doesn't even remember why she came to the hospital and so her sister reminded her.   Emotional Assessment Appearance:  Appears stated age Attitude/Demeanor/Rapport:   (pleasant and cooperative) Affect (typically observed):  Accepting, Adaptable, Calm Orientation:  Oriented to Self, Oriented to Place, Oriented to Situation Alcohol / Substance use:  Not Applicable Psych involvement (Current and /or in the community):  No (Comment)  Discharge Needs  Concerns to be addressed:  Care Coordination Readmission within the last 30 days:  No Current discharge risk:  None Barriers to Discharge:  No Barriers Identified   Shela Leff, LCSW 11/20/2015, 1:38 PM

## 2015-11-20 NOTE — Plan of Care (Signed)
When pt was given gabapentin, nurse started to see pt become more drowsy as the day progressed. Will confer message to MD about the observation

## 2015-11-20 NOTE — Progress Notes (Signed)
Patient ID: [name], female   DOB: 11/19/1940, 75 y.o.   MRN: [id number]   [facility] [place] NAME: [name]    MR#:  [id number]  DATE OF BIRTH:  [birth year]  SUBJECTIVE:  CHIEF COMPLAINT:   Chief Complaint  Patient presents with  . Stroke Symptoms  Patient seen and examined. Chart reviewed and care assumed. She is a 75 year old female who was admitted with acute encephalopathy, acute hypoxic respiratory failure secondary to exacerbation of diastolic congestive heart failure, possible COPD. Per patient's family her mental status is at baseline. Patient herself has no complaints. She denies chest pain, shortness of breath, nausea vomiting, constipation, diarrhea, fevers chills.  REVIEW OF SYSTEMS:  ROS CONSTITUTIONAL: No fever/chills, fatigue, weakness, weight gain/loss, headache. EYES: No blurry or double vision. ENT: No tinnitus, postnasal drip, redness or soreness of the oropharynx. RESPIRATORY: No cough, dyspnea, wheeze, hemoptysis.  CARDIOVASCULAR: No chest pain, palpitations, syncope, orthopnea,  GASTROINTESTINAL: No nausea, vomiting, constipation, diarrhea, abdominal pain, hematemesis, melena or hematochezia. GENITOURINARY: No dysuria, frequency, hematuria. ENDOCRINE: No polyuria or nocturia. No heat or cold intolerance. HEMATOLOGY: No anemia, bruising, bleeding. INTEGUMENTARY: No rashes, ulcers, lesions. MUSCULOSKELETAL: No arthritis, gout, dyspnea.  NEUROLOGIC: No numbness, tingling, ataxia, seizure-type activity, weakness. PSYCHIATRIC: No anxiety, depression, insomnia.   DRUG ALLERGIES:   Allergies  Allergen Reactions  . Penicillins Hives   VITALS:  Blood pressure 104/80, pulse 84, temperature 98.4 F (36.9 C), temperature source Axillary, resp. rate (!) 28, height 5\' 6"  (1.676 m), weight 102.1 kg (225 lb), SpO2 92 %. PHYSICAL EXAMINATION:  Physical Exam  GENERAL:  75 y.o.-year-old patient lying in the bed with  no acute distress. Sitting up, talkative, pleasant and cooperative. EYES: Pupils equal, round, reactive to light and accommodation. No scleral icterus. Extraocular muscles intact.  HEENT: Head atraumatic, normocephalic. Oropharynx and nasopharynx clear. On BiPAP NECK:  Supple, no jugular venous distention. No thyroid enlargement, no tenderness.  LUNGS:  grossly normal breath sounds bilaterally, no wheezing, rales,rhonchi or crepitation. No use of accessory muscles of respiration.  CARDIOVASCULAR: S1, S2 normal. No murmurs, rubs, or gallops.  ABDOMEN: Soft, nontender, nondistended. Bowel sounds present. No organomegaly or mass.  EXTREMITIES: 1+  pedal edema, no cyanosis, or clubbing.  NEUROLOGIC: Cranial nerves II through XII are intact. Muscle strength 5/5 in all extremities. Sensation intact. Gait not checked.  PSYCHIATRIC: Patient is awake and alert and oriented 3. Mood, affect and thought content are appropriate. SKIN: No obvious rash, lesion, or ulcer.  LABORATORY PANEL:   CBC  Recent Labs Lab 11/19/15 1732  WBC 7.3  HGB 8.5*  HCT 27.4*  PLT 113*   ------------------------------------------------------------------------------------------------------------------ Chemistries   Recent Labs Lab 11/19/15 1158  11/20/15 0357  NA 138  --  137  K 4.9  --  4.5  CL 97*  --  98*  CO2 36*  --  33*  GLUCOSE 83  --  71  BUN 23*  --  26*  CREATININE 1.00  < > 1.14*  CALCIUM 9.0  --  8.5*  AST 30  --   --   ALT 17  --   --   ALKPHOS 78  --   --   BILITOT 0.7  --   --   < > = values in this interval not displayed. RADIOLOGY:  US Venous Img Upper Uni Left  Result Date: 11/20/2015 CLINICAL DATA:  Left upper extremity edema EXAM: LEFT UPPER EXTREMITY VENOUS DOPPLER ULTRASOUND TECHNIQUE:  Gray-scale sonography with graded compression, as well as color Doppler and duplex ultrasound were performed to evaluate the upper extremity deep venous system from the level of the subclavian vein and  including the jugular, axillary, basilic, radial, ulnar and upper cephalic vein. Spectral Doppler was utilized to evaluate flow at rest and with distal augmentation maneuvers. COMPARISON:  None. FINDINGS: Contralateral Subclavian Vein: Respiratory phasicity is normal and symmetric with the symptomatic side. No evidence of thrombus. Normal compressibility. Internal Jugular Vein: No evidence of thrombus. Normal compressibility, respiratory phasicity and response to augmentation. Subclavian Vein: No evidence of thrombus. Normal compressibility, respiratory phasicity and response to augmentation. Axillary Vein: No evidence of thrombus. Normal compressibility, respiratory phasicity and response to augmentation. Cephalic Vein: No evidence of thrombus. Normal compressibility, respiratory phasicity and response to augmentation. Basilic Vein: No evidence of thrombus. Normal compressibility, respiratory phasicity and response to augmentation. Brachial Veins: No evidence of thrombus. Normal compressibility, respiratory phasicity and response to augmentation. Radial Veins: No evidence of thrombus. Normal compressibility, respiratory phasicity and response to augmentation. Ulnar Veins: No evidence of thrombus. Normal compressibility, respiratory phasicity and response to augmentation. Venous Reflux:  None visualized. Other Findings:  None visualized. IMPRESSION: No evidence of deep venous thrombosis. Electronically Signed   By: Jerilynn Mages.  Shick M.D.   On: 11/20/2015 09:53   Dg Chest Port 1 View  Result Date: 11/20/2015 CLINICAL DATA:  Acute onset of respiratory failure. Initial encounter. EXAM: PORTABLE CHEST 1 VIEW COMPARISON:  Chest radiograph performed 11/19/2015 FINDINGS: The lungs are well-aerated. Mild vascular congestion is noted. Mildly increased interstitial markings could reflect minimal interstitial edema. Small bilateral pleural effusions are seen. There is no evidence of pneumothorax. The cardiomediastinal silhouette  is borderline enlarged. No acute osseous abnormalities are seen. IMPRESSION: Mild vascular congestion and borderline cardiomegaly. Mildly increased interstitial markings could reflect minimal interstitial edema. Small bilateral pleural effusions noted. Electronically Signed   By: Garald Balding M.D.   On: 11/20/2015 04:03   ASSESSMENT AND PLAN:  [name]  is a 75 y.o. female with a known history of MI, hypertension, GERD, diabetes who is a resident of [facility] comes to the emergency room after she was found unresponsive by nursing home staff. Patient had not taken her narcotics in a few weeks. She was given Narcan without any response. She was found to have left-sided weakness according to the daughter. This weakness was noted Sunday evening and was started on some aspirin. Came to the emergency room she was found to be hypoxic and unresponsive. She was immediately placed on a BiPAP. Workup in the emergency room showed acute hypoxic hypercarbic respiratory failure with CHF and acute encephalopathy likely due to hypercarbia.   1. Acute encephalopathy, resolved patient is currently at baseline. -Etiology clear except patient has hypercarbia hypoxic - CT head negative for acute stroke -MRI of the brain now that patient is off BiPAP. -Patient is a DO NOT RESUSCITATE this was confirmed with daughter she carries a yellow out of facility DO NOT RESUSCITATE for  2. Acute hypoxic hypercarbic respiratory failure secondary to congestive heart failure acute diastolic with possible COPD although no formal diagnosis of it. Patient has been a former smoker -IV Lasix 20 mg twice a day, bronchodilators as needed -Echo in the past showed EF of 65% echo was done in 2016 I will not repeat it -Pulmonary consultation and recommendations appreciated. - Continue nocturnal BiPAP  3. Left-sided weakness per family -CT head negative for stroke -MRI will be done  -Resume  aspirin -Carotid Doppler   3.  Diabetes -Sliding scale insulin - Holding oral hypoglycemics for now.  4. History of MI -Continue aspirin -We'll resume cardiac meds including lisinopril at reduced dose.  5. DVT prophylaxis subcutaneous Lovenox  Patient is critically ill. Case was discussed with pulmonary. Are aware of critical illness.   All the records are reviewed and case discussed with ED provider. Management plans discussed with the patient, family and they are in agreement.  CODE STATUS: DO NOT RESUSCITATE  TOTAL CRITICAL TIME TAKING CARE OF THIS PATIENT: 35 minutes.    [employer], D.O.   Between 7am to 6pm - Pager - [phone]  After 6pm go to [email or web address] - password [email or web address] [facility]  Office  [phone]  CC: Primary care physician; Pcp Not In System Note: This dictation was prepared with Dragon dictation along with smaller phrase technology. Any transcriptional errors that result from this process are unintentional.

## 2015-11-20 NOTE — Care Management (Signed)
Patient from [facility]. CSW aware. Will assist if needed.

## 2015-11-20 NOTE — Progress Notes (Signed)
   Name: [name] MRN: [id number] DOB: 06/18/1940    ADMISSION DATE:  11/19/2015 CONSULTATION DATE:  11/19/15   REFERRING MD :  Dr. Fritzi Mandes  CHIEF COMPLAINT:  Unresponsive  BRIEF PATIENT DESCRIPTION: This is a 75 y.o. female admitted to [facility] 11/19/15 with acute hypoxic hypercapnic respiratory failure secondary to CHF exacerbation and acute encephalopathy secondary to acute CVA vs hypercapnia  SIGNIFICANT EVENTS  11/19/15: Admission to [facility] SDU 11/19/15: PCCM consultation for further management of acute hypoxic hypercapnic respiratory failure  STUDIES:  11/16 CT Head: No acute intracranial abnormalities. Mild to moderate atrophy and mild chronic microvascular ischemic change.  SUBJECTIVE:  RASS 0. No distress. RA SpO2 88-93% on RA. Cognition intact. She has little recall of the events of yesterday. Per her daughter, she was found unresponsive by SNF staff with BiPAP mask off  VITAL SIGNS: Temp:  [98.4 F (36.9 C)-99.4 F (37.4 C)] 98.4 F (36.9 C) (11/17 0730) Pulse Rate:  [42-98] 80 (11/17 1100) Resp:  [12-24] 22 (11/17 1100) BP: (63-149)/(42-101) 149/79 (11/17 1100) SpO2:  [92 %-100 %] 97 % (11/17 1100) FiO2 (%):  [50 %] 50 % (11/16 1900) Weight:  [225 lb (102.1 kg)] 225 lb (102.1 kg) (11/16 1340)  PHYSICAL EXAMINATION: General:  NAD Neuro: CNs intact, L hemiparesis (chronic) HEENT:  NCAT Cardiovascular:  Reg, no M Lungs: no adventitious sounds Abdomen:  Obese, soft, non-tender Ext: mild LUE edema   Recent Labs Lab 11/19/15 1158 11/19/15 1732 11/20/15 0357  NA 138  --  137  K 4.9  --  4.5  CL 97*  --  98*  CO2 36*  --  33*  BUN 23*  --  26*  CREATININE 1.00 1.05* 1.14*  GLUCOSE 83  --  71    Recent Labs Lab 11/19/15 1158 11/19/15 1732  HGB 9.1* 8.5*  HCT 29.5* 27.4*  WBC 10.3 7.3  PLT 132* 113*     ASSESSMENT: Acute on chronic hypoxic/hypercapnic respiratory failure OSA/OHS - poorly documented in med records. Chronic nocturnal NPPV    AMS - ? Syncope  ? Hypoglycemia related  ? arrhythmia  ?  "anniversary seizure" and found post-ictal (had CVA 11/13/14)  This might simply be hypercarbia due to OHS/OSA after removal of BiPAP Chronic hypertension DM2 Hypoglycemia  PLAN/REC:  Cont low flow supplemental O2 to maintain SpO2 > 88% Continue nocturnal BiPAP Transfer to to telemetry if OK with Dr Posey Pronto Check EEG - if evidence of seizure focus, consult Neurology Check Echocardiogram  Holding insulin and metformin Resume Lisinopril @ reduced dose  PCCM will sign off. Please call if we can be of further assistance   Merton Border, MD PCCM service Mobile [phone] Pager [phone] 11/20/2015

## 2015-11-21 ENCOUNTER — Inpatient Hospital Stay
Admit: 2015-11-21 | Discharge: 2015-11-21 | Disposition: A | Discharge status: Home / Self Care | Payer: Medicare Other | Attending: Pulmonary Disease | Admitting: Pulmonary Disease

## 2015-11-21 ENCOUNTER — Inpatient Hospital Stay: Payer: Medicare Other

## 2015-11-21 LAB — CBC
HCT: 24.9 % — ABNORMAL LOW (ref 35.0–47.0)
HEMOGLOBIN: 8.1 g/dL — AB (ref 12.0–16.0)
MCH: 26 pg (ref 26.0–34.0)
MCHC: 32.4 g/dL (ref 32.0–36.0)
MCV: 80.2 fL (ref 80.0–100.0)
PLATELETS: 112 10*3/uL — AB (ref 150–440)
RBC: 3.11 MIL/uL — AB (ref 3.80–5.20)
RDW: 17 % — ABNORMAL HIGH (ref 11.5–14.5)
WBC: 8.6 10*3/uL (ref 3.6–11.0)

## 2015-11-21 LAB — BASIC METABOLIC PANEL
ANION GAP: 4 — AB (ref 5–15)
BUN: 23 mg/dL — ABNORMAL HIGH (ref 6–20)
CALCIUM: 8.8 mg/dL — AB (ref 8.9–10.3)
CO2: 37 mmol/L — ABNORMAL HIGH (ref 22–32)
CREATININE: 1.02 mg/dL — AB (ref 0.44–1.00)
Chloride: 95 mmol/L — ABNORMAL LOW (ref 101–111)
GFR, EST NON AFRICAN AMERICAN: 53 mL/min — AB (ref >60)
GLUCOSE: 140 mg/dL — AB (ref 65–99)
Potassium: 4.1 mmol/L (ref 3.5–5.1)
Sodium: 136 mmol/L (ref 135–145)

## 2015-11-21 LAB — GLUCOSE, CAPILLARY
GLUCOSE-CAPILLARY: 128 mg/dL — AB (ref 65–99)
GLUCOSE-CAPILLARY: 122 mg/dL — AB (ref 65–99)
GLUCOSE-CAPILLARY: 152 mg/dL — AB (ref 65–99)
Glucose-Capillary: 111 mg/dL — ABNORMAL HIGH (ref 65–99)

## 2015-11-21 MED ORDER — IPRATROPIUM-ALBUTEROL 0.5-2.5 (3) MG/3ML IN SOLN: 3.0000 mL | Freq: Four times a day (QID) | RESPIRATORY_TRACT | Status: DC | PRN

## 2015-11-21 MED ORDER — LISINOPRIL 20 MG PO TABS
40.0000 mg | ORAL_TABLET | Freq: Every day | ORAL | Status: DC
Start: 2015-11-22 — End: 2015-11-24
  Administered 2015-11-22 – 2015-11-24 (×3): 40 mg via ORAL
  Filled 2015-11-21 (×4): qty 2

## 2015-11-21 MED ORDER — AMLODIPINE BESYLATE 5 MG PO TABS
5.0000 mg | ORAL_TABLET | Freq: Every day | ORAL | Status: DC
  Administered 2015-11-21 – 2015-11-22 (×2): 5 mg via ORAL
  Filled 2015-11-21 (×2): qty 1

## 2015-11-21 NOTE — Evaluation (Signed)
Clinical/Bedside Swallow Evaluation Patient Details  Name: [name] MRN: [id number] Date of Birth: 24-Jun-1940  Today's Date: 11/21/2015 Time: SLP Start Time (ACUTE ONLY): 1245 SLP Stop Time (ACUTE ONLY): 1315 SLP Time Calculation (min) (ACUTE ONLY): 30 min  Past Medical History:  Past Medical History:  Diagnosis Date  . Arthritis   . Diabetes mellitus without complication ([place])   . GERD (gastroesophageal reflux disease)   . HLD (hyperlipidemia)   . Hypertension   . MI (myocardial infarction)    1990s  . Non Hodgkin's lymphoma ([place]) 2009   s/p stem cell transplant  . Spinal stenosis   . TIA (transient ischemic attack)    Past Surgical History:  Past Surgical History:  Procedure Laterality Date  . ABDOMINAL HYSTERECTOMY    . EYE SURGERY Bilateral   . PERIPHERAL VASCULAR CATHETERIZATION N/A 04/01/2015   Procedure: Abdominal Aortogram w/Lower Extremity;  Surgeon: Katha Cabal, MD;  Location: [facility];  Service: Cardiovascular;  Laterality: N/A;  . PERIPHERAL VASCULAR CATHETERIZATION  04/01/2015   Procedure: Lower Extremity Intervention;  Surgeon: Katha Cabal, MD;  Location: [facility];  Service: Cardiovascular;;  . TOE AMPUTATION Left    HPI:  [name]  is a 75 y.o. female with a known history of MI, hypertension, GERD, diabetes who is a resident of [facility] comes to the emergency room after she was found unresponsive by nursing home staff. Patient had not taken her narcotics in a few weeks. She was given Narcan without any response. She was found to have left-sided weakness according to the daughter. This weakness was noted Sunday evening and was started on some aspirin. Came to the emergency room she was found to be hypoxic and unresponsive. She was immediately placed on a BiPAP. Workup in the emergency room showed acute hypoxic hypercarbic respiratory failure with CHF and acute encephalopathy likely due to hypercarbia. CT head  negative for acute stroke. Unable to do MRI since patient is on BiPAP.   Assessment / Plan / Recommendation Clinical Impression  Pt presents w/mild aspiration risk d/t mild oral dysphagia w/solids d/t pt is edentulous and does not have dentures with her. Pt given trials of thin, puree and solid consistencies. No overt s/s of aspiration were observed with any tested consistency. Pt did have intermittent cough before, during, and after trials. Vocal quality remained clear throughout, unable to assess laryngeal elevation d/t pt positioning. Pt demonstrated mildly impaired mastication with solid consistency d/t pt is edentulous. Pt's family also reported she demonstrated difficulties with solid yesterday. Recommend Dys III diet w/thin liquid and aspiration precautions. Will f/u re: toleration of diet in 1-3 days. Discussed w/pt/family and nsg.     Aspiration Risk  Mild aspiration risk    Diet Recommendation Dysphagia 3 (Mech soft);Thin liquid   Liquid Administration via: Cup;Straw Medication Administration: Whole meds with liquid Supervision: Patient able to self feed Compensations: Minimize environmental distractions;Slow rate;Small sips/bites Postural Changes: Seated upright at 90 degrees    Other  Recommendations Oral Care Recommendations: Oral care BID;Staff/trained caregiver to provide oral care   Follow up Recommendations Skilled Nursing facility      Frequency and Duration min 3x week  1 week       Prognosis Prognosis for Safe Diet Advancement: Good      Swallow Study   General Date of Onset: 11/20/15 HPI: [name]  is a 75 y.o. female with a known history of MI, hypertension, GERD, diabetes who is a  resident of [facility] comes to the emergency room after she was found unresponsive by nursing home staff. Patient had not taken her narcotics in a few weeks. She was given Narcan without any response. She was found to have left-sided weakness according to the daughter. This  weakness was noted Sunday evening and was started on some aspirin. Came to the emergency room she was found to be hypoxic and unresponsive. She was immediately placed on a BiPAP. Workup in the emergency room showed acute hypoxic hypercarbic respiratory failure with CHF and acute encephalopathy likely due to hypercarbia. CT head negative for acute stroke. Unable to do MRI since patient is on BiPAP. Type of Study: Bedside Swallow Evaluation Previous Swallow Assessment: none Diet Prior to this Study: Regular;Thin liquids Temperature Spikes Noted: No Respiratory Status: Nasal cannula History of Recent Intubation: No Behavior/Cognition: Alert;Cooperative Oral Cavity Assessment: Within Functional Limits Oral Care Completed by SLP: No Oral Cavity - Dentition: Edentulous;Dentures, not available Vision: Functional for self-feeding Self-Feeding Abilities: Able to feed self Patient Positioning: Upright in bed Baseline Vocal Quality: Normal Volitional Cough: Strong Volitional Swallow: Able to elicit    Oral/Motor/Sensory Function Overall Oral Motor/Sensory Function: Within functional limits   Ice Chips Ice chips: Not tested   Thin Liquid Thin Liquid: Within functional limits Presentation: Cup;Straw Other Comments: 4 ounces    Nectar Thick Nectar Thick Liquid: Not tested   Honey Thick Honey Thick Liquid: Not tested   Puree Puree: Within functional limits Presentation: Spoon Other Comments: 3 tsps    Solid   GO   Solid: Impaired Presentation: Self Fed Oral Phase Impairments: Impaired mastication Oral Phase Functional Implications: Impaired mastication        [place],Azaylia Fong 11/21/2015,2:16 PM

## 2015-11-21 NOTE — Progress Notes (Signed)
Pt has returned to the unit, niece in to visit.

## 2015-11-21 NOTE — Progress Notes (Signed)
Pt. Transferred from ccu to room 142

## 2015-11-21 NOTE — Evaluation (Signed)
Physical Therapy Evaluation Patient Details Name: [name] MRN: [id number] DOB: 1940/08/11 Today's Date: 11/21/2015   History of Present Illness  [name] is a 75 y.o. female with a known history of MI, hypertension, GERD, diabetes who is a resident of [facility] comes to the emergency room after she was found unresponsive by nursing home staff. Patient had not taken her narcotics in a few weeks. She was given Narcan without any response. She was found to have left-sided weakness according to the daughter. This weakness was noted Sunday evening and was started on some aspirin. Came to the emergency room she was found to be hypoxic and unresponsive. She was immediately placed on a BiPAP. Workup in the emergency room showed acute hypoxic hypercarbic respiratory failure with CHF and acute encephalopathy likely due to hypercarbia. CT head negative for acute stroke. MRI negative for acute changes. UE dopper negative for DVT. Daughter reports that pt has not ambulated since January 2017 due to weakness. She lives at [facility] and staff provides 24/7 assist. Staff uses a mechanical lift for all transfers  Clinical Impression  Pt admitted with above diagnosis. Pt currently with functional limitations due to the deficits listed below (see PT Problem List).  Patient reports that she would like to be able to walk again and daughter states that she would like for patient to receive physical therapy at [facility]. Patient has not ambulated since January 2017 due to weakness and deficits from CVA. Patient is profoundly weak during evaluation requiring max assistance to go from supine to sitting. Patient with poor sitting balance requiring max assistance to remain upright in sitting and she continually leans heavily to her right. Patient unsafe/unable to attempt transfers or ambulation at this time. It is unlikely that after such a prolonged time without ambulating the patient will get back to the  place where she is ambulatory again. Patient does not possess significant lower extremity strength currently to support her body weight in standing for transfers or ambulation Patient should return to [facility] at discharge and can receive home health physical therapy at facility per primary care physician discretion. Pt will benefit from skilled PT services to address deficits in strength, balance, and mobility in order to return to full function at home.     Follow Up Recommendations Home health PT;Other (comment) (Return to [facility] with [facility] PT per family request)    Equipment Recommendations  None recommended by PT;Other (comment) (TBD at facility)    Recommendations for Other Services       Precautions / Restrictions Precautions Precautions: Fall Restrictions Weight Bearing Restrictions: No      Mobility  Bed Mobility Overal bed mobility: Needs Assistance Bed Mobility: Supine to Sit;Sit to Supine     Supine to sit: Max assist Sit to supine: Max assist   General bed mobility comments: Pt requires maxA+1 for rolling to the R and going from sidelying to sitting. Pt unable to remain in sitting position without UE support, feet on floor, and maxA+1 from therapist. Pt leans heavily to her R.   Transfers                 General transfer comment: Unable/unsafe at this time  Ambulation/Gait             General Gait Details: Unable/unsafe at this time  Financial trader Rankin (Stroke  Patients Only)       Balance Overall balance assessment: Needs assistance Sitting-balance support: Feet supported;Bilateral upper extremity supported Sitting balance-Leahy Scale: Poor Sitting balance - Comments: Pt requires maxA+1 to remain upright in sitting. She leans heavily to the R and struggles to remain in sitting                                     Pertinent Vitals/Pain Pain Assessment: 0-10 Pain Score:  0-No pain Pain Location: R toes    Home Living Family/patient expects to be discharged to:: Assisted living                 Additional Comments: Pt has not ambulated since January 2017 due to weakness. Staff uses a mechanical lift for all transfers    Prior Function Level of Independence: Needs assistance   Gait / Transfers Assistance Needed: Has not ambulated or transferred since January 2017 per family  ADL's / Homemaking Assistance Needed: Requires full assist for ADLs/IADLs        Hand Dominance   Dominant Hand: Right    Extremity/Trunk Assessment   Upper Extremity Assessment: Generalized weakness;RUE deficits/detail RUE Deficits / Details: Pt with bilateral shoulder flexion weakness, L is weaker than R with patient only able to perform about 20 degrees of L shoulder flexion         Lower Extremity Assessment: Generalized weakness;RLE deficits/detail RLE Deficits / Details: Pt with 2+/5 R knee extension, 4-/5 L knee extension, bilateral ankle plantarflexion contractures       Communication   Communication: Other (comment) (Pt mumbles/slurs her speech)  Cognition Arousal/Alertness: Awake/alert Behavior During Therapy: WFL for tasks assessed/performed Overall Cognitive Status: Within Functional Limits for tasks assessed                      General Comments      Exercises     Assessment/Plan    PT Assessment Patient needs continued PT services  PT Problem List Decreased strength;Decreased activity tolerance;Decreased balance;Decreased mobility;Obesity          PT Treatment Interventions DME instruction;Gait training;Functional mobility training;Therapeutic activities;Therapeutic exercise;Balance training;Neuromuscular re-education;Patient/family education    PT Goals (Current goals can be found in the Care Plan section)  Acute Rehab PT Goals Patient Stated Goal: "I want to be able to walk again." PT Goal Formulation: With  patient/family Time For Goal Achievement: 12/05/15 Potential to Achieve Goals: Poor    Frequency Min 2X/week   Barriers to discharge        Co-evaluation               End of Session   Activity Tolerance: Patient tolerated treatment well Patient left: in bed;with call bell/phone within reach;with bed alarm set           Time: [id number] PT Time Calculation (min) (ACUTE ONLY): 17 min   Charges:   PT Evaluation $PT Eval Low Complexity: 1 Procedure     PT G Codes:       Lyndel Safe Huprich PT, DPT   Huprich,Jason 11/21/2015, 4:28 PM

## 2015-11-21 NOTE — Progress Notes (Signed)
Patient ID: [name], female   DOB: 1940/02/09, 75 y.o.   MRN: [id number]   [place] [facility] [place] NAME: [name]    MR#:  [id number]  DATE OF BIRTH:  11-19-1940  SUBJECTIVE:  CHIEF COMPLAINT:   Chief Complaint  Patient presents with  . Stroke Symptoms   Patient seen and examined at bedside. This is a 75 year old female who was admitted with acute encephalopathy, acute hypoxic respiratory failure secondary to exacerbation of diastolic respiratory failure and possibly COPD. Reports that she "feels fine." No overnight events noted. Chest pain, shortness of breath, palpitations, dizziness, lightheadedness, weakness.   Of note nursing informing the patient received gabapentin at 3 PM and by 5 PM was extremely lethargic, sedated. The remaining doses of gabapentin has been held since then with resolution of her altered mental status. REVIEW OF SYSTEMS:  ROS CONSTITUTIONAL: Nofever/chills, fatigue, weakness, weight gain/loss, headache. EYES: No blurry or double vision. ENT: No tinnitus, postnasal drip, redness or soreness of the oropharynx. RESPIRATORY: No cough, dyspnea, wheeze, hemoptysis.  CARDIOVASCULAR: No chest pain, palpitations, syncope, orthopnea,  GASTROINTESTINAL: No nausea, vomiting, constipation, diarrhea, abdominal pain, hematemesis, melena or hematochezia. GENITOURINARY: No dysuria, frequency, hematuria. ENDOCRINE: No polyuria or nocturia. No heat or cold intolerance. HEMATOLOGY: No anemia, bruising, bleeding. INTEGUMENTARY: No rashes, ulcers, lesions. MUSCULOSKELETAL: No arthritis, gout, dyspnea.  NEUROLOGIC: No numbness, tingling, ataxia, seizure-type activity, weakness. PSYCHIATRIC: No anxiety, depression, insomnia. DRUG ALLERGIES:   Allergies  Allergen Reactions  . Penicillins Hives   VITALS:  Blood pressure (!) 174/83, pulse 89, temperature 97.7 F (36.5 C), temperature source Oral, resp. rate (!) 22, height 5\' 6"   (1.676 m), weight 102.1 kg (225 lb), SpO2 100 %. PHYSICAL EXAMINATION:  Physical Exam  GENERAL: 75 y.o.-year-old patient lying in the bed with no acute distress. Sitting up, talkative, pleasant and cooperative. EYES: Pupils equal, round, reactive to light and accommodation. No scleral icterus. Extraocular muscles intact.  HEENT: Head atraumatic, normocephalic. Oropharynx and nasopharynx clear. On BiPAP NECK: Supple, no jugular venous distention. No thyroid enlargement, no tenderness.  LUNGS:  clear to ascultation bilaterally. No rales rhonchi or wheezes.. No use of accessory muscles of respiration.  CARDIOVASCULAR: S1, S2 normal. No murmurs, rubs, or gallops.  ABDOMEN: Soft, nontender, nondistended. Bowel sounds present. No organomegaly or mass.  EXTREMITIES:1+ pedal edema, no cyanosis, or clubbing.  NEUROLOGIC: Cranial nerves II through XII are intact. Muscle strength 5/5 in all extremities. Sensation intact. Gait not checked.  PSYCHIATRIC: Patient is awake and alert and oriented 3. Mood, affect and thought content are appropriate. SKIN: No obvious rash, lesion, or ulcer.  LABORATORY PANEL:   CBC  Recent Labs Lab 11/21/15 0552  WBC 8.6  HGB 8.1*  HCT 24.9*  PLT 112*   ------------------------------------------------------------------------------------------------------------------ Chemistries   Recent Labs Lab 11/19/15 1158  11/21/15 0552  NA 138  < > 136  K 4.9  < > 4.1  CL 97*  < > 95*  CO2 36*  < > 37*  GLUCOSE 83  < > 140*  BUN 23*  < > 23*  CREATININE 1.00  < > 1.02*  CALCIUM 9.0  < > 8.8*  AST 30  --   --   ALT 17  --   --   ALKPHOS 78  --   --   BILITOT 0.7  --   --   < > = values in this interval not displayed. RADIOLOGY:  No results found. ASSESSMENT AND PLAN:  Tricia Dixonis a 75 y.o. femalewith a known history of MI, hypertension, GERD, diabetes who is a resident of [facility] comes to the emergency room after she was found unresponsive by  nursing home staff. Patient had not taken her narcotics in a few weeks. She was given Narcan without any response. She was found to have left-sided weakness according to the daughter. This weakness was noted Sunday evening and was started on some aspirin. Came to the emergency room she was found to be hypoxic and unresponsive. She was immediately placed on a BiPAP. Workup in the emergency room showed acute hypoxic hypercarbic respiratory failure with CHF and acute encephalopathy likely due to hypercarbia.   1.Acute encephalopathy, resolved patient is currently at baseline. -Etiology clear except patient has hypercarbia hypoxic - CT head negative for acute stroke -MRI of the brain pending. -Carotid Dopplers done and results are pending. -I have discontinued gabapentin secondary to severe lethargy  2. Acute hypoxic hypercarbic respiratory failure secondary to congestive heart failure acute diastolic with possible COPD although no formal diagnosis of it. Patient has been a former smoker. - Still tachynpeic at times.  -Lasix 40 mg daily, bronchodilators as needed -Echo in the past showed EF of 65% echo was done in 2016. -Pulmonary consultation and recommendations appreciated. - Continue nocturnal BiPAP  3. Left-sided weakness per family.  -CT head negative for stroke -MRI will be done  -Resume aspirin -Carotid Doppler pending  4. Anemia, chronic.  Monitor CBC in AM.   5. Hypertension, uncontrolled - resume Lisinopril and Norvasc at home doses..   5. Diabetes -Sliding scale insulin - Holding oral hypoglycemics for now.  4. History of MI -Continue aspirin and cardiac meds.   5. DVT prophylaxis subcutaneous Lovenox  Patient stable and improving.  Will request PT eval for discharge planning.   All the records are reviewed and case discussed with ED provider. Management plans discussed with the patient, family and they are in agreement.  CODE STATUS: DO NOT  RESUSCITATE-Patient is a DO NOT RESUSCITATE this was confirmed with daughter she carries a yellow out of facility DO NOT RESUSCITATE form  TOTAL CRITICAL TIME TAKING CARE OF THIS PATIENT: 35 minutes.     More than 50% of the time was spent in counseling/coordination of care: YES  POSSIBLE D/C IN 2 DAYS, DEPENDING ON CLINICAL CONDITION.   Trisha Morandi D.O. on 11/21/2015 at 11:24 AM  Between 7am to 6pm - Pager - [phone]  After 6pm go to [email or web address] - Proofreader  [facility]  Office  [phone]  CC: Primary care physician; Pcp Not In System  Note: This dictation was prepared with Dragon dictation along with smaller phrase technology. Any transcriptional errors that result from this process are unintentional.

## 2015-11-21 NOTE — Progress Notes (Signed)
*  PRELIMINARY RESULTS* Echocardiogram 2D Echocardiogram has been performed.  [name] 11/21/2015, 10:27 AM

## 2015-11-21 NOTE — Progress Notes (Signed)
Shift assessment completed at 0800. Pt is resting in bed with O2 on at [facility], pt sounds congested, is not sob. Pt is on monitor, Sr noted. Abdomen si soft, bs heard. Pt had foley draining yellow urine,ppp, no edema noted. Pt stated that she is unable to walk. PIV intact to LEJ, site is free of redness and swelling.pt alert and oriented to self, place/situation, Dr. Ara Kussmaul in to see pt, pt stated that she agreed with Dr.that neurontin may have caused her mental status changes, and admits that this med makes her sleepy, she takes it three times a day and feels its too much. Since assessment completed, this writer has removed pt's foley since she is able to make her needs known, and pt has had loose brown stool on bedpan, partially incontinent of stool as well. Pt left the floor at 0930 to have echocardiogram completed.

## 2015-11-21 NOTE — Plan of Care (Signed)
Problem: Bowel/Gastric: Goal: Will not experience complications related to bowel motility Outcome: Progressing Pt has had testing completed today, remains alert and oriented, and is progressing toward goals. Pt has remained free of falls/injury this shift.

## 2015-11-21 NOTE — Progress Notes (Signed)
SLP Cancellation Note  Patient Details Name: [name] MRN: [id number] DOB: [birth year]   Cancelled treatment:       Reason Eval/Treat Not Completed: Patient at procedure or test/unavailable Spoke with nsg and reviewed chart.Nsg reports pt is tolerating current diet well.  Pt is currently out of room for procedure. Will re-attempt later today if pt is available.    [place] 11/21/2015, 9:51 AM

## 2015-11-22 LAB — CBC
HCT: 24.2 % — ABNORMAL LOW (ref 35.0–47.0)
HCT: 23.6 % — ABNORMAL LOW (ref 35.0–47.0)
Hemoglobin: 7.9 g/dL — ABNORMAL LOW (ref 12.0–16.0)
Hemoglobin: 7.9 g/dL — ABNORMAL LOW (ref 12.0–16.0)
MCH: 25.6 pg — ABNORMAL LOW (ref 26.0–34.0)
MCH: 26.8 pg (ref 26.0–34.0)
MCHC: 32.4 g/dL (ref 32.0–36.0)
MCHC: 33.3 g/dL (ref 32.0–36.0)
MCV: 79.1 fL — ABNORMAL LOW (ref 80.0–100.0)
MCV: 80.6 fL (ref 80.0–100.0)
Platelets: 103 10*3/uL — ABNORMAL LOW (ref 150–440)
Platelets: 110 10*3/uL — ABNORMAL LOW (ref 150–440)
RBC: 3.07 MIL/uL — ABNORMAL LOW (ref 3.80–5.20)
RBC: 2.93 MIL/uL — ABNORMAL LOW (ref 3.80–5.20)
RDW: 16.3 % — ABNORMAL HIGH (ref 11.5–14.5)
RDW: 16.5 % — ABNORMAL HIGH (ref 11.5–14.5)
WBC: 7.1 10*3/uL (ref 3.6–11.0)
WBC: 6.9 10*3/uL (ref 3.6–11.0)

## 2015-11-22 LAB — ECHOCARDIOGRAM COMPLETE
HEIGHTINCHES: 66 in
WEIGHTICAEL: 3600 [oz_av]

## 2015-11-22 LAB — GLUCOSE, CAPILLARY
Glucose-Capillary: 115 mg/dL — ABNORMAL HIGH (ref 65–99)
Glucose-Capillary: 172 mg/dL — ABNORMAL HIGH (ref 65–99)
Glucose-Capillary: 110 mg/dL — ABNORMAL HIGH (ref 65–99)
Glucose-Capillary: 174 mg/dL — ABNORMAL HIGH (ref 65–99)

## 2015-11-22 LAB — BASIC METABOLIC PANEL WITH GFR
Anion gap: 5 (ref 5–15)
BUN: 18 mg/dL (ref 6–20)
CO2: 37 mmol/L — ABNORMAL HIGH (ref 22–32)
Calcium: 8.9 mg/dL (ref 8.9–10.3)
Chloride: 94 mmol/L — ABNORMAL LOW (ref 101–111)
Creatinine, Ser: 0.88 mg/dL (ref 0.44–1.00)
GFR calc Af Amer: >60 mL/min
GFR calc non Af Amer: >60 mL/min
Glucose, Bld: 144 mg/dL — ABNORMAL HIGH (ref 65–99)
Potassium: 3.8 mmol/L (ref 3.5–5.1)
Sodium: 136 mmol/L (ref 135–145)

## 2015-11-22 LAB — FOLATE: Folate: 37 ng/mL

## 2015-11-22 LAB — FERRITIN: Ferritin: 55 ng/mL (ref 11–307)

## 2015-11-22 LAB — ABO/RH: ABO/RH(D): B POS

## 2015-11-22 LAB — PREPARE RBC (CROSSMATCH)

## 2015-11-22 LAB — IRON AND TIBC
Iron: 20 ug/dL — ABNORMAL LOW (ref 28–170)
Saturation Ratios: 7 % — ABNORMAL LOW (ref 10.4–31.8)
TIBC: 298 ug/dL (ref 250–450)
UIBC: 278 ug/dL

## 2015-11-22 MED ORDER — AMLODIPINE BESYLATE 10 MG PO TABS
10.0000 mg | ORAL_TABLET | Freq: Every day | ORAL | Status: DC
  Administered 2015-11-23 – 2015-11-24 (×2): 10 mg via ORAL
  Filled 2015-11-22 (×2): qty 1

## 2015-11-22 MED ORDER — SODIUM CHLORIDE 0.9 % IV SOLN: Freq: Once | INTRAVENOUS | Status: DC

## 2015-11-22 NOTE — Progress Notes (Signed)
This Probation officer spoke to Dr. Ara Kussmaul, received order for type and screen.

## 2015-11-22 NOTE — Progress Notes (Signed)
RT called by RN that pt pulled bipap mask off. When RT entered room, bipap mask was pulled down to pts chin and pt was asleep. RT attempted to reapply mask and pt immediately yanked it off. RT tried to explain the benefits of the Bipap to the pt but pt refused to leave mask on. 2L [place] reapplied, RN aware.

## 2015-11-22 NOTE — Progress Notes (Addendum)
Patient ID: SHEYLY BAYONA, female   DOB: 07-14-1940, 75 y.o.   MRN: [id number]   [place] [facility] [place] NAME: Keilani Ante    MR#:  [id number]  DATE OF BIRTH:  [birth year]  SUBJECTIVE:  CHIEF COMPLAINT:   Chief Complaint  Patient presents with  . Stroke Symptoms  This is a 75 year old female who is admitted with acute encephalopathy, acute hypoxic respiratory failure secondary to exacerbation of diastolic congestive heart failure and possibly COPD.   Patient seen and examined this morning at bedside. Has had no additional episodes of lethargy since her gabapentin has been held. Patient continues to deny complaints of chest pain, shortness of breath, dyspnea on exertion, palpitations, dizziness, light headedness and weakness.  REVIEW OF SYSTEMS:  ROS CONSTITUTIONAL: Nofever/chills, fatigue, weakness, weight gain/loss, headache. EYES: No blurry or double vision. ENT: No tinnitus, postnasal drip, redness or soreness of the oropharynx. RESPIRATORY: No cough, dyspnea, wheeze, hemoptysis.  CARDIOVASCULAR: No chest pain, palpitations, syncope, orthopnea,  GASTROINTESTINAL: No nausea, vomiting, constipation, diarrhea, abdominal pain, hematemesis, melena or hematochezia. GENITOURINARY: No dysuria, frequency, hematuria. ENDOCRINE: No polyuria or nocturia. No heat or cold intolerance. HEMATOLOGY: No anemia, bruising, bleeding. INTEGUMENTARY: No rashes, ulcers, lesions. MUSCULOSKELETAL: No arthritis, gout, dyspnea.  NEUROLOGIC: No numbness, tingling, ataxia, seizure-type activity, weakness. PSYCHIATRIC: No anxiety, depression, insomnia. DRUG ALLERGIES:   Allergies  Allergen Reactions  . Penicillins Hives   VITALS:  Blood pressure (!) 163/84, pulse 82, temperature 97.9 F (36.6 C), temperature source Oral, resp. rate 20, height 5\' 6"  (1.676 m), weight 102.1 kg (225 lb), SpO2 99 %. PHYSICAL EXAMINATION:  Physical Exam  GENERAL: 75 y.o.-year-old  patient lying in the bed with no acute distress.Sitting up, talkative, pleasant and cooperative. EYES: Pupils equal, round, reactive to light and accommodation. No scleral icterus. Extraocular muscles intact.  HEENT: Head atraumatic, normocephalic. Oropharynx and nasopharynx clear. On BiPAP NECK: Supple, no jugular venous distention. No thyroid enlargement, no tenderness.  LUNGS: clear to ascultation bilaterally. No rales rhonchi or wheezes.. No use of accessory muscles of respiration.  CARDIOVASCULAR: S1, S2 normal. No murmurs, rubs, or gallops.  ABDOMEN: Soft, nontender, nondistended. Bowel sounds present. No organomegaly or mass.  EXTREMITIES:1+ pedal edema, no cyanosis, or clubbing.  NEUROLOGIC: Cranial nerves II through XII are intact. Muscle strength 5/5 in all extremities. Sensation intact. Gait not checked.  PSYCHIATRIC: Patient is awake and alert and oriented 3. Mood, affect and thought content are appropriate. SKIN: No obvious rash, lesion, or ulcer.  LABORATORY PANEL:   CBC  Recent Labs Lab 11/22/15 0411  WBC 7.1  HGB 7.9*  HCT 24.2*  PLT 103*   ------------------------------------------------------------------------------------------------------------------ Chemistries   Recent Labs Lab 11/19/15 1158  11/22/15 0411  NA 138  < > 136  K 4.9  < > 3.8  CL 97*  < > 94*  CO2 36*  < > 37*  GLUCOSE 83  < > 144*  BUN 23*  < > 18  CREATININE 1.00  < > 0.88  CALCIUM 9.0  < > 8.9  AST 30  --   --   ALT 17  --   --   ALKPHOS 78  --   --   BILITOT 0.7  --   --   < > = values in this interval not displayed. RADIOLOGY:  Mr Brain Wo Contrast  Result Date: 11/21/2015 CLINICAL DATA:  Altered mental status EXAM: MRI HEAD WITHOUT CONTRAST TECHNIQUE: Multiplanar, multiecho pulse sequences of the brain  and surrounding structures were obtained without intravenous contrast. COMPARISON:  11/19/2015 head CT FINDINGS: Brain: No acute infarction, hemorrhage, hydrocephalus,  extra-axial collection or mass lesion. Generalized atrophy that is mild for age. Halo of FLAIR hyperintensity around the lateral ventricles attributed to chronic microvascular disease. Vascular: Preserved flow voids Skull and upper cervical spine: No focal lesion. Sinuses/Orbits: Generalized mucosal edema in the paranasal sinuses with right maxillary fluid level and near complete opacification. Bilateral mastoid fluid. IMPRESSION: 1. No acute intracranial finding, including infarct. 2. Mild chronic microvascular disease. 3. Bilateral sinusitis and mastoiditis with right maxillary fluid level. Electronically Signed   By: Monte Fantasia M.D.   On: 11/21/2015 12:47   ASSESSMENT AND PLAN:   Jenniferanne Dixonis a 75 y.o. femalewith a known history of MI, hypertension, GERD, diabetes who is a resident of [facility] comes to the emergency room after she was found unresponsive by nursing home staff. Patient had not taken her narcotics in a few weeks. She was given Narcan without any response. She was found to have left-sided weakness according to the daughter. This weakness was noted Sunday evening and was started on some aspirin. Came to the emergency room she was found to be hypoxic and unresponsive. She was immediately placed on a BiPAP. Workup in the emergency room showed acute hypoxic hypercarbic respiratory failure with CHF and acute encephalopathy likely due to hypercarbia.   1.Acute encephalopathy, resolved patient is currently at baseline. -Etiology clear except patient has hypercarbia hypoxic - CT head negative for acute stroke -MRI of the brain negative for any acute intracranial finding including infarct, mild chronic microvascular disease, bilateral sinusitis and mastoiditis with a right maxillary fluid level. -Carotid Dopplers done and revealed less than 50% stenosis in the right and left internal carotid arteries however I things are worrisome for abnormal left cervical adenopathy for which  CT of the neck with contrast is recommended and malignancy cannot be excluded.  -I have discontinued gabapentin secondary to severe lethargy  2. Acute hypoxic hypercarbic respiratory failure secondary to congestive heart failure acute diastolic with possible COPD although no formal diagnosis of it. Patient has been a former smoker. - Still tachynpeic at times.  -Lasix 40 mg daily, bronchodilators as needed -Echo in the past showed EF of 65% echo was done in 2016. -Pulmonary consultationand recommendations appreciated. - Continue nocturnal BiPAP  3. Left-sided weakness per family.  -CT and MRI head negative for stroke -Resume aspirin  4. Anemia, acute on chronic.   I will repeat the CBC at noon and transfuse if hemoglobin is still less than 8. Also check iron studies, B12 and folate. FOBT.  5. Hypertension, uncontrolled - continue lisinopril at home dose, Lasix at home dose. I've increased Norvasc to 10 due to persistent hypertension  6. Cervical lymphadenopathy, concerning for malignancy.  Given history of NHL treated with stem cell transplant in 2009, will consult with oncology.  Patient had concerning cervical lymph nodes in January 2017 for which oncology recommended colonoscopy. Patient never did follow-up and have the colonoscopy done. We'll defer to oncology regarding additional imaging and/or workup at this time.   5. Diabetes -Sliding scale insulin - Holding oral hypoglycemics for now.  4. History of MI -Continue aspirin and cardiac meds.   5. DVT prophylaxis subcutaneous Lovenox  Patient stable and improving.  Will request PT eval for discharge planning.   Plan of care was discussed with the daughter [name] at length by phone.  CODE STATUS: DO NOT RESUSCITATE-Patient is a DO  NOT RESUSCITATE this was confirmed with daughter she carries a yellow out of facility DO NOT RESUSCITATE form  TOTAL CRITICAL TIME TAKING CARE OF THIS PATIENT: 35 minutes.   More than 50%  of the time was spent in counseling/coordination of care: YES  POSSIBLE D/C IN 2 DAYS, DEPENDING ON CLINICAL CONDITION.   Summerlynn Glauser D.O. on 11/22/2015 at 11:09 AM  Between 7am to 6pm - Pager - [phone]  After 6pm go to [email or web address] - Proofreader  [facility] [place]  Office  [phone]  CC: Primary care physician; Pcp Not In System  Note: This dictation was prepared with Dragon dictation along with smaller phrase technology. Any transcriptional errors that result from this process are unintentional.

## 2015-11-22 NOTE — Progress Notes (Signed)
Pt is refusing BipAP

## 2015-11-23 DIAGNOSIS — Z87891 Personal history of nicotine dependence: Secondary | ICD-10-CM

## 2015-11-23 DIAGNOSIS — M1990 Unspecified osteoarthritis, unspecified site: Secondary | ICD-10-CM

## 2015-11-23 DIAGNOSIS — R161 Splenomegaly, not elsewhere classified: Secondary | ICD-10-CM

## 2015-11-23 DIAGNOSIS — Z9481 Bone marrow transplant status: Secondary | ICD-10-CM

## 2015-11-23 DIAGNOSIS — M542 Cervicalgia: Secondary | ICD-10-CM

## 2015-11-23 DIAGNOSIS — I252 Old myocardial infarction: Secondary | ICD-10-CM

## 2015-11-23 DIAGNOSIS — I10 Essential (primary) hypertension: Secondary | ICD-10-CM

## 2015-11-23 DIAGNOSIS — G9340 Encephalopathy, unspecified: Secondary | ICD-10-CM

## 2015-11-23 DIAGNOSIS — C8318 Mantle cell lymphoma, lymph nodes of multiple sites: Secondary | ICD-10-CM

## 2015-11-23 DIAGNOSIS — R590 Localized enlarged lymph nodes: Secondary | ICD-10-CM

## 2015-11-23 DIAGNOSIS — E875 Hyperkalemia: Secondary | ICD-10-CM

## 2015-11-23 DIAGNOSIS — E119 Type 2 diabetes mellitus without complications: Secondary | ICD-10-CM

## 2015-11-23 LAB — CBC
HEMATOCRIT: 27.7 % — AB (ref 35.0–47.0)
HEMOGLOBIN: 9 g/dL — AB (ref 12.0–16.0)
MCH: 25.9 pg — ABNORMAL LOW (ref 26.0–34.0)
MCHC: 32.6 g/dL (ref 32.0–36.0)
MCV: 79.4 fL — AB (ref 80.0–100.0)
Platelets: 107 10*3/uL — ABNORMAL LOW (ref 150–440)
RBC: 3.48 MIL/uL — AB (ref 3.80–5.20)
RDW: 16.7 % — AB (ref 11.5–14.5)
WBC: 7.7 10*3/uL (ref 3.6–11.0)

## 2015-11-23 LAB — BASIC METABOLIC PANEL
ANION GAP: 5 (ref 5–15)
BUN: 14 mg/dL (ref 6–20)
CALCIUM: 8.7 mg/dL — AB (ref 8.9–10.3)
CHLORIDE: 90 mmol/L — AB (ref 101–111)
CO2: 37 mmol/L — AB (ref 22–32)
Creatinine, Ser: 0.93 mg/dL (ref 0.44–1.00)
GFR calc non Af Amer: 59 mL/min — ABNORMAL LOW (ref >60)
GLUCOSE: 169 mg/dL — AB (ref 65–99)
POTASSIUM: 3.6 mmol/L (ref 3.5–5.1)
Sodium: 132 mmol/L — ABNORMAL LOW (ref 135–145)

## 2015-11-23 LAB — VITAMIN B12: VITAMIN B 12: 458 pg/mL (ref 180–914)

## 2015-11-23 LAB — GLUCOSE, CAPILLARY
GLUCOSE-CAPILLARY: 90 mg/dL (ref 65–99)
GLUCOSE-CAPILLARY: 146 mg/dL — AB (ref 65–99)
Glucose-Capillary: 159 mg/dL — ABNORMAL HIGH (ref 65–99)
Glucose-Capillary: 153 mg/dL — ABNORMAL HIGH (ref 65–99)
Glucose-Capillary: 167 mg/dL — ABNORMAL HIGH (ref 65–99)

## 2015-11-23 MED ORDER — BRIMONIDINE TARTRATE 0.2 % OP SOLN
1.0000 [drp] | Freq: Two times a day (BID) | OPHTHALMIC | Status: DC
  Administered 2015-11-23 – 2015-11-24 (×2): 1 [drp] via OPHTHALMIC
  Filled 2015-11-23: qty 5

## 2015-11-23 MED ORDER — FUROSEMIDE 10 MG/ML IJ SOLN
40.0000 mg | Freq: Once | INTRAMUSCULAR | Status: AC
  Administered 2015-11-23: 40 mg via INTRAVENOUS
  Filled 2015-11-23: qty 4

## 2015-11-23 MED ORDER — TIMOLOL MALEATE 0.5 % OP SOLN
1.0000 [drp] | Freq: Two times a day (BID) | OPHTHALMIC | Status: DC
  Administered 2015-11-23 – 2015-11-24 (×2): 1 [drp] via OPHTHALMIC
  Filled 2015-11-23: qty 5

## 2015-11-23 MED ORDER — METOPROLOL TARTRATE 25 MG PO TABS
25.0000 mg | ORAL_TABLET | Freq: Two times a day (BID) | ORAL | Status: DC
  Administered 2015-11-23 – 2015-11-24 (×3): 25 mg via ORAL
  Filled 2015-11-23 (×3): qty 1

## 2015-11-23 MED ORDER — ACETAZOLAMIDE ER 500 MG PO CP12
500.0000 mg | ORAL_CAPSULE | Freq: Once | ORAL | Status: DC
  Filled 2015-11-23: qty 1

## 2015-11-23 MED ORDER — ACETAZOLAMIDE 250 MG PO TABS
500.0000 mg | ORAL_TABLET | Freq: Once | ORAL | Status: AC
  Administered 2015-11-23: 500 mg via ORAL
  Filled 2015-11-23: qty 2

## 2015-11-23 NOTE — Progress Notes (Signed)
Per Neoma Laming admissions coordinator at [facility] patient is a long term care resident and can return when stable. Clinical Social Worker (CSW) will continue to follow and assist as needed.   [employer], LCSW [phone]

## 2015-11-23 NOTE — Progress Notes (Signed)
Speech Therapy Note:   Chart reviewed. Nursing indicated no concerns regarding Pt's swallowing abilities. Pt noted no difficulties swallowing and had just completed her lunch meal upon ST services entering. Pt stated her swallowing abilities are at her baseline. Pt had a family member at bedside and noted the diet the Pt is on is "much better" and she is "very happy with it". Recommend Pt continue on a Dysphagia 3 diet with Thin liquids at this time with general aspiration precautions. ST services will be available for additional education or evaluation if a decline in status during admission.    Rivka Safer, B.A. Clinical Graduate Student

## 2015-11-23 NOTE — Progress Notes (Signed)
Patient ID: [name], female   DOB: December 05, 1940, 75 y.o.   MRN: [id number]   [facility] [place] NAME: [name]    MR#:  [id number]  DATE OF BIRTH:  10-06-1940  SUBJECTIVE:  CHIEF COMPLAINT:   Chief Complaint  Patient presents with  . Stroke Symptoms   Admitted for shortness of breath and confusion.  Alert and awake today. Shortness of breath improved.  REVIEW OF SYSTEMS:  ROS CONSTITUTIONAL: Nofever/chills, fatigue, weakness, weight gain/loss, headache. EYES: No blurry or double vision. ENT: No tinnitus, postnasal drip, redness or soreness of the oropharynx. RESPIRATORY: No cough, dyspnea, wheeze, hemoptysis.  CARDIOVASCULAR: No chest pain, palpitations, syncope, orthopnea,  GASTROINTESTINAL: No nausea, vomiting, constipation, diarrhea, abdominal pain, hematemesis, melena or hematochezia. GENITOURINARY: No dysuria, frequency, hematuria. ENDOCRINE: No polyuria or nocturia. No heat or cold intolerance. HEMATOLOGY: No anemia, bruising, bleeding. INTEGUMENTARY: No rashes, ulcers, lesions. MUSCULOSKELETAL: No arthritis, gout, dyspnea.  NEUROLOGIC: No numbness, tingling, ataxia, seizure-type activity, weakness. PSYCHIATRIC: No anxiety, depression, insomnia. DRUG ALLERGIES:   Allergies  Allergen Reactions  . Penicillins Hives   VITALS:  Blood pressure (!) 163/72, pulse 82, temperature 98.6 F (37 C), temperature source Oral, resp. rate 20, height 5\' 6"  (1.676 m), weight 102.1 kg (225 lb), SpO2 100 %. PHYSICAL EXAMINATION:  Physical Exam  GENERAL: 75 y.o.-year-old patient lying in the bed with no acute distress.Sitting up, talkative, pleasant and cooperative. EYES: Pupils equal, round, reactive to light and accommodation. No scleral icterus. Extraocular muscles intact.  HEENT: Head atraumatic, normocephalic. Oropharynx and nasopharynx clear. On BiPAP NECK: Supple, no jugular venous distention. No thyroid enlargement, no  tenderness.  LUNGS: clear to ascultation bilaterally. No rales rhonchi or wheezes.. No use of accessory muscles of respiration.  CARDIOVASCULAR: S1, S2 normal. No murmurs, rubs, or gallops.  ABDOMEN: Soft, nontender, nondistended. Bowel sounds present. No organomegaly or mass.  EXTREMITIES:1+ pedal edema, no cyanosis, or clubbing.  NEUROLOGIC: Cranial nerves II through XII are intact. Muscle strength 5/5 in all extremities. Sensation intact. Gait not checked.  PSYCHIATRIC: Patient is awake and alert and oriented 3. Mood, affect and thought content are appropriate. SKIN: No obvious rash, lesion, or ulcer.  LABORATORY PANEL:   CBC  Recent Labs Lab 11/23/15 0317  WBC 7.7  HGB 9.0*  HCT 27.7*  PLT 107*   ------------------------------------------------------------------------------------------------------------------ Chemistries   Recent Labs Lab 11/19/15 1158  11/23/15 0317  NA 138  < > 132*  K 4.9  < > 3.6  CL 97*  < > 90*  CO2 36*  < > 37*  GLUCOSE 83  < > 169*  BUN 23*  < > 14  CREATININE 1.00  < > 0.93  CALCIUM 9.0  < > 8.7*  AST 30  --   --   ALT 17  --   --   ALKPHOS 78  --   --   BILITOT 0.7  --   --   < > = values in this interval not displayed. RADIOLOGY:  No results found. ASSESSMENT AND PLAN:   Tricia Dixonis a 75 y.o. femalewith a known history of MI, hypertension, GERD, diabetes who is a resident of [facility] comes to the emergency room after she was found unresponsive by nursing home staff. Patient had not taken her narcotics in a few weeks. She was given Narcan without any response. She was found to have left-sided weakness according to the daughter. This weakness was noted Sunday evening and  was started on some aspirin. Came to the emergency room she was found to be hypoxic and unresponsive. She was immediately placed on a BiPAP. Workup in the emergency room showed acute hypoxic hypercarbic respiratory failure with CHF and acute encephalopathy  likely due to hypercarbia.   1.Acute encephalopathy, resolved patient is currently at baseline. - CT head negative for acute stroke -MRI of the brain negative for any acute intracranial finding including infarct. -Carotid Dopplers done and revealed less than 50% stenosis in the right and left internal carotid arteries however I things are worrisome for abnormal left cervical adenopathy for which CT of the neck with contrast is recommended and malignancy cannot be excluded.  -I have discontinued gabapentin secondary to severe lethargy.  2. Acute hypoxic hypercarbic respiratory failure secondary to congestive heart failure acute diastolic with possible COPD. Patient has been a former smoker. -Lasix 40 mg daily, bronchodilators as needed -Echo in the past showed EF of 65% echo was done in 2016. -Pulmonary consultationand recommendations appreciated. - Continue nocturnal BiPAP - We'll give 1 dose of IV Lasix.  3. Left-sided weakness per family.  -CT and MRI head negative for stroke -Resume aspirin  4. Anemia, acute on chronic, iron deficiency Oncology consulted Stool for Hemoccult blood. Transfused 1 unit packed RBC. Hemoglobin improved.  5. Hypertension, uncontrolled  On lisinopril and Norvasc.  6. Cervical lymphadenopathy, concerning for malignancy.  Given history of NHL treated with stem cell transplant in 2009, will consult with oncology.  Patient had concerning cervical lymph nodes in January 2017 for which oncology recommended colonoscopy. Patient never did follow-up and have the colonoscopy done. We'll defer to oncology regarding additional imaging and/or workup at this time.   5. Diabetes -Sliding scale insulin - Holding oral hypoglycemics for now.  4. History of MI -Continue aspirin and cardiac meds.   5. DVT prophylaxis subcutaneous Lovenox  Patient stable and improving.  Will request PT eval for discharge planning.   Plan of care was discussed with the  daughter [name] at length by phone.  CODE STATUS: DO NOT RESUSCITATE  TOTAL CRITICAL TIME TAKING CARE OF THIS PATIENT: 35 minutes.   POSSIBLE D/C IN 2 DAYS, DEPENDING ON CLINICAL CONDITION.   Hillary Bow R M.D. on 11/23/2015 at 11:56 AM  Between 7am to 6pm - Pager - [phone]  After 6pm go to [email or web address] - Proofreader  [facility]  Office  [phone]  CC: Primary care physician; Pcp Not In System  Note: This dictation was prepared with Dragon dictation along with smaller phrase technology. Any transcriptional errors that result from this process are unintentional.

## 2015-11-23 NOTE — Consult Note (Signed)
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  INTERVAL HISTORY: Patient is a 75 year old female who has a long-standing history of mantle cell lymphoma and received a bone marrow transplant at [facility] in 2009. She was last evaluated in the [facility] in January 2017 at which time she was noted to have a positive PET scan suspicious for recurrent disease as well as a PET positive lesion in her colon and colonoscopy was recommended. No further imaging or procedures were completed and patient was subsequently lost to follow-up. Her encephalopathy has improved and she appears back to her baseline. She continues to have multiple medical complaints. She denies any chest pain or shortness of breath. She has no nausea, vomiting, constipation, or diarrhea. She denies any melena or hematochezia. She denies any fevers, chills, night sweats, or weight loss. Patient offers no further specific complaints.  REVIEW OF SYSTEMS:   Review of Systems  Constitutional: Positive for malaise/fatigue. Negative for chills, fever and weight loss.  HENT: Negative.   Respiratory: Negative.  Negative for cough.   Cardiovascular: Positive for leg swelling. Negative for chest pain.  Gastrointestinal: Negative.  Negative for abdominal pain, blood in stool, constipation, diarrhea, heartburn, melena, nausea and vomiting.  Genitourinary: Negative.   Musculoskeletal: Positive for back pain.  Neurological: Positive for weakness. Negative for focal weakness.  Psychiatric/Behavioral: Positive for memory loss. The patient is not nervous/anxious.     As per HPI. Otherwise, a complete review of systems is negative.  PAST MEDICAL HISTORY: Past Medical History:   Diagnosis Date  . Arthritis   . Diabetes mellitus without complication ([place])   . GERD (gastroesophageal reflux disease)   . HLD (hyperlipidemia)   . Hypertension   . MI (myocardial infarction)    1990s  . Non Hodgkin's lymphoma ([place]) 2009   s/p stem cell transplant  . Spinal stenosis   . TIA (transient ischemic attack)     PAST SURGICAL HISTORY: Past Surgical History:  Procedure Laterality Date  . ABDOMINAL HYSTERECTOMY    . EYE SURGERY Bilateral   . PERIPHERAL VASCULAR CATHETERIZATION N/A 04/01/2015   Procedure: Abdominal Aortogram w/Lower Extremity;  Surgeon: Katha Cabal, MD;  Location: [facility];  Service: Cardiovascular;  Laterality: N/A;  . PERIPHERAL VASCULAR CATHETERIZATION  04/01/2015   Procedure: Lower Extremity Intervention;  Surgeon: Katha Cabal, MD;  Location: [facility];  Service: Cardiovascular;;  . TOE AMPUTATION Left     FAMILY HISTORY: Family History  Problem Relation Age of Onset  . Hypertension Mother   . Heart disease Mother   . Diabetes Father   . Breast cancer Sister     ADVANCED DIRECTIVES (Y/N):  @ADVDIR @  HEALTH MAINTENANCE: Social History  Substance Use Topics  . Smoking status: Former Research scientist (life sciences)  . Smokeless tobacco: Never Used  . Alcohol use No     Colonoscopy:  PAP:  Bone density:  Lipid panel:  Allergies  Allergen Reactions  . Penicillins Hives    Current Facility-Administered Medications  Medication Dose Route Frequency Provider Last Rate Last Dose  . 0.9 %  sodium chloride infusion   Intravenous Once [employer], DO      . acetaminophen (TYLENOL) tablet 650 mg  650 mg Oral Q6H PRN Sona  Posey Pronto, MD      . amLODipine (NORVASC) tablet 10 mg  10 mg Oral Daily Alexis Hugelmeyer, DO   10 mg at 11/23/15 0851  . aspirin EC tablet 81 mg  81 mg Oral Daily Wilhelmina Mcardle, MD   81 mg at 11/23/15 [id number]  . benzonatate (TESSALON) capsule 100 mg  100 mg Oral TID PRN Alexis Hugelmeyer, DO   100 mg at 11/22/15  0805  . enoxaparin (LOVENOX) injection 40 mg  40 mg Subcutaneous Q24H Fritzi Mandes, MD   40 mg at 11/22/15 2014  . famotidine (PEPCID) tablet 20 mg  20 mg Oral Daily Wilhelmina Mcardle, MD   20 mg at 11/23/15 0851  . furosemide (LASIX) tablet 40 mg  40 mg Oral Daily Wilhelmina Mcardle, MD   40 mg at 11/23/15 [id number]  . guaiFENesin (ROBITUSSIN) 100 MG/5ML solution 100 mg  5 mL Oral Q4H PRN Wilhelmina Mcardle, MD   100 mg at 11/22/15 1136  . ipratropium-albuterol (DUONEB) 0.5-2.5 (3) MG/3ML nebulizer solution 3 mL  3 mL Nebulization Q6H PRN Alexis Hugelmeyer, DO      . lisinopril (PRINIVIL,ZESTRIL) tablet 40 mg  40 mg Oral Daily Alexis Hugelmeyer, DO   40 mg at 11/23/15 0851  . metoprolol tartrate (LOPRESSOR) tablet 25 mg  25 mg Oral BID Hillary Bow, MD   25 mg at 11/23/15 1214  . sodium chloride flush (NS) 0.9 % injection 3 mL  3 mL Intravenous Q12H Fritzi Mandes, MD   3 mL at 11/23/15 0851    OBJECTIVE: Vitals:   11/23/15 0437 11/23/15 0732  BP: (!) 178/80 (!) 163/72  Pulse: 80 82  Resp: 20 20  Temp: 97.7 F (36.5 C) 98.6 F (37 C)     Body mass index is 36.32 kg/m.    ECOG FS:2 - Symptomatic, <50% confined to bed  General: No acute distress. Eyes: Pink conjunctiva, anicteric sclera. HEENT: Normocephalic, moist mucous membranes, clear oropharnyx. Lungs: Clear to auscultation bilaterally. Heart: Regular rate and rhythm. No rubs, murmurs, or gallops. Abdomen: Soft, nontender, nondistended. No organomegaly noted, normoactive bowel sounds. Musculoskeletal: No edema, cyanosis, or clubbing. Neuro: Alert. Cranial nerves grossly intact. Skin: No rashes or petechiae noted. Psych: Normal affect.   LAB RESULTS:  Lab Results  Component Value Date   NA 132 (L) 11/23/2015   K 3.6 11/23/2015   CL 90 (L) 11/23/2015   CO2 37 (H) 11/23/2015   GLUCOSE 169 (H) 11/23/2015   BUN 14 11/23/2015   CREATININE 0.93 11/23/2015   CALCIUM 8.7 (L) 11/23/2015   PROT 7.7 11/19/2015   ALBUMIN 3.7 11/19/2015   AST 30  11/19/2015   ALT 17 11/19/2015   ALKPHOS 78 11/19/2015   BILITOT 0.7 11/19/2015   GFRNONAA 59 (L) 11/23/2015   GFRAA >60 11/23/2015    Lab Results  Component Value Date   WBC 7.7 11/23/2015   NEUTROABS 5.7 11/19/2015   HGB 9.0 (L) 11/23/2015   HCT 27.7 (L) 11/23/2015   MCV 79.4 (L) 11/23/2015   PLT 107 (L) 11/23/2015     STUDIES: Mr Brain Wo Contrast  Result Date: 11/21/2015 CLINICAL DATA:  Altered mental status EXAM: MRI HEAD WITHOUT CONTRAST TECHNIQUE: Multiplanar, multiecho pulse sequences of the brain and surrounding structures were obtained without intravenous contrast. COMPARISON:  11/19/2015 head CT FINDINGS: Brain: No acute infarction, hemorrhage, hydrocephalus, extra-axial collection or mass lesion. Generalized atrophy that is mild for age. Halo of FLAIR hyperintensity around the lateral ventricles attributed to chronic microvascular  disease. Vascular: Preserved flow voids Skull and upper cervical spine: No focal lesion. Sinuses/Orbits: Generalized mucosal edema in the paranasal sinuses with right maxillary fluid level and near complete opacification. Bilateral mastoid fluid. IMPRESSION: 1. No acute intracranial finding, including infarct. 2. Mild chronic microvascular disease. 3. Bilateral sinusitis and mastoiditis with right maxillary fluid level. Electronically Signed   By: Monte Fantasia M.D.   On: 11/21/2015 12:47   US Carotid Bilateral  Result Date: 11/21/2015 CLINICAL DATA:  Altered mental status EXAM: BILATERAL CAROTID DUPLEX ULTRASOUND TECHNIQUE: Pearline Cables scale imaging, color Doppler and duplex ultrasound were performed of bilateral carotid and vertebral arteries in the neck. COMPARISON:  None. FINDINGS: Criteria: Quantification of carotid stenosis is based on velocity parameters that correlate the residual internal carotid diameter with NASCET-based stenosis levels, using the diameter of the distal internal carotid lumen as the denominator for stenosis measurement. The  following velocity measurements were obtained: RIGHT ICA:  60 cm/sec CCA:  50 cm/sec SYSTOLIC ICA/CCA RATIO:  1.2 DIASTOLIC ICA/CCA RATIO:  5.6 ECA:  67 cm/sec LEFT ICA:  72 cm/sec CCA:  54 cm/sec SYSTOLIC ICA/CCA RATIO:  1.3 DIASTOLIC ICA/CCA RATIO:  2.4 ECA:  39 cm/sec RIGHT CAROTID ARTERY: Minimal intimal thickening in the bulb. Low resistance internal carotid Doppler pattern. RIGHT VERTEBRAL ARTERY:  Antegrade LEFT CAROTID ARTERY: Minimal soft smooth plaque in the bulb. Low resistance internal carotid Doppler pattern. Additional findings: Abnormally enlarged an appearing lymph nodes in the left side of the neck are noted. There are hypoechoic masses without a fatty hilum. Short axis diameter of 2 of these areas is 14 and 12 mm. IMPRESSION: Less than 50% stenosis in the right and left internal carotid arteries. Findings are worrisome for abnormal left cervical adenopathy. Malignancy is not excluded. CT neck with contrast is recommended. Electronically Signed   By: Marybelle Killings M.D.   On: 11/21/2015 12:35   US Venous Img Upper Uni Left  Result Date: 11/20/2015 CLINICAL DATA:  Left upper extremity edema EXAM: LEFT UPPER EXTREMITY VENOUS DOPPLER ULTRASOUND TECHNIQUE: Gray-scale sonography with graded compression, as well as color Doppler and duplex ultrasound were performed to evaluate the upper extremity deep venous system from the level of the subclavian vein and including the jugular, axillary, basilic, radial, ulnar and upper cephalic vein. Spectral Doppler was utilized to evaluate flow at rest and with distal augmentation maneuvers. COMPARISON:  None. FINDINGS: Contralateral Subclavian Vein: Respiratory phasicity is normal and symmetric with the symptomatic side. No evidence of thrombus. Normal compressibility. Internal Jugular Vein: No evidence of thrombus. Normal compressibility, respiratory phasicity and response to augmentation. Subclavian Vein: No evidence of thrombus. Normal compressibility,  respiratory phasicity and response to augmentation. Axillary Vein: No evidence of thrombus. Normal compressibility, respiratory phasicity and response to augmentation. Cephalic Vein: No evidence of thrombus. Normal compressibility, respiratory phasicity and response to augmentation. Basilic Vein: No evidence of thrombus. Normal compressibility, respiratory phasicity and response to augmentation. Brachial Veins: No evidence of thrombus. Normal compressibility, respiratory phasicity and response to augmentation. Radial Veins: No evidence of thrombus. Normal compressibility, respiratory phasicity and response to augmentation. Ulnar Veins: No evidence of thrombus. Normal compressibility, respiratory phasicity and response to augmentation. Venous Reflux:  None visualized. Other Findings:  None visualized. IMPRESSION: No evidence of deep venous thrombosis. Electronically Signed   By: Jerilynn Mages.  Shick M.D.   On: 11/20/2015 09:53   Dg Chest Port 1 View  Result Date: 11/20/2015 CLINICAL DATA:  Acute onset of respiratory failure. Initial encounter. EXAM: PORTABLE CHEST 1 VIEW COMPARISON:  Chest radiograph performed 11/19/2015 FINDINGS: The lungs are well-aerated. Mild vascular congestion is noted. Mildly increased interstitial markings could reflect minimal interstitial edema. Small bilateral pleural effusions are seen. There is no evidence of pneumothorax. The cardiomediastinal silhouette is borderline enlarged. No acute osseous abnormalities are seen. IMPRESSION: Mild vascular congestion and borderline cardiomegaly. Mildly increased interstitial markings could reflect minimal interstitial edema. Small bilateral pleural effusions noted. Electronically Signed   By: Garald Balding M.D.   On: 11/20/2015 04:03   Dg Chest Portable 1 View  Result Date: 11/19/2015 CLINICAL DATA:  Unresponsive. History of coronary artery disease, previous MI, non-Hodgkin's lymphoma, former smoker. EXAM: PORTABLE CHEST 1 VIEW COMPARISON:  Chest  x-ray of November 15, 2014 FINDINGS: The lungs are adequately inflated. There are pleural effusions bilaterally greatest on the right. The interstitial markings are increased. The cardiac silhouette is enlarged. The pulmonary vascularity is mildly engorged. The mediastinum is subjectively widened. The bony thorax is unremarkable. IMPRESSION: CHF with mild interstitial edema and small bilateral pleural effusions greatest on the right. Electronically Signed   By: David  Martinique M.D.   On: 11/19/2015 13:24   Ct Head Code Stroke W/o Cm  Result Date: 11/19/2015 CLINICAL DATA:  Code stroke. Stroke symptoms. Possible right facial droop and right-sided weakness. History of non-Hodgkin's lymphoma. EXAM: CT HEAD WITHOUT CONTRAST TECHNIQUE: Contiguous axial images were obtained from the base of the skull through the vertex without intravenous contrast. COMPARISON:  01/20/2015 FINDINGS: Brain: There is no evidence of acute cortical infarct, intracranial hemorrhage, mass, midline shift, or extra-axial fluid collection. Mild cerebral atrophy is unchanged and within normal limits for age. Periventricular white matter hypodensities are unchanged and nonspecific but compatible with mild chronic small vessel ischemic disease. Vascular: Calcified atherosclerosis at the skullbase. No hyperdense vessel. Skull: No fracture or focal osseous lesion. Sinuses/Orbits: Prior bilateral cataract extraction. Near complete opacification of the ethmoid sinuses. Mucosal thickening and fluid in the sphenoid greater than frontal sinuses. Large amount of right maxillary sinus fluid. Other: Partially visualize nasal airway. ASPECTS [facility] Stroke Program Early CT Score) - Ganglionic level infarction (caudate, lentiform nuclei, internal capsule, insula, M1-M3 cortex): 7 - Supraganglionic infarction (M4-M6 cortex): 3 Total score (0-10 with 10 being normal): 10 IMPRESSION: 1. No evidence of acute intracranial abnormality. 2. ASPECTS is 10. 3. Mild  chronic small vessel ischemic disease. These results were called by telephone at the time of interpretation on 11/19/2015 at 12:26 pm to Dr. Merlyn Lot , who verbally acknowledged these results. Electronically Signed   By: Logan Bores M.D.   On: 11/19/2015 12:27    ASSESSMENT: History of mantle cell lymphoma.  PLAN:    1. Mantle cell lymphoma: PET scan completed on January 07, 2015 highly suspicious for underlying recurrence with hypermetabolically enlarged spleen and hypermetabolic lymph nodes in the neck, supraclavicular, and abdominal areas. No further imaging has been completed since that time and patient was subsequently lost to follow-up. Although this is highly suspicious for recurrence, given her overall performance status and multiple comorbidities additional treatment may be difficult. We will have patient follow-up in the [facility] in approximately 2 weeks to discuss reimaging and any further intervention needed. 2. Cecal mass: Patient also noted to also have intense hypermetabolism in the cecum and colonoscopy was recommended. This was ultimately scheduled, but never completed. Follow-up in the [facility] as above for further evaluation. If patient's performance status improves, consider referral back to GI for colonoscopy. 3. Iron deficiency anemia: Improved with blood transfusion. Recommend Hemoccult cards  and possible GI consultation given hypermetabolic mass seen on PET scan in January 2017.  Appreciate consult, call with questions.  Lloyd Huger, MD   11/23/2015 1:07 PM

## 2015-11-23 NOTE — Discharge Instructions (Signed)
Resume diet and activity as before ° ° °

## 2015-11-23 NOTE — Progress Notes (Signed)
[place] rounding the unit visited Pt whom he had seen sometime back in the ED area, but Pt was asleep at the time of this visit. [place] plans to visit the Pt again on another day.    11/23/15 1300  Clinical Encounter Type  Visited With Patient  Visit Type Follow-up;Spiritual support  Spiritual Encounters  Spiritual Needs Prayer;Emotional;Other (Comment)

## 2015-11-24 LAB — CBC WITH DIFFERENTIAL/PLATELET
Basophils Absolute: 0 10*3/uL (ref 0–0.1)
Basophils Relative: 0 %
EOS PCT: 2 %
Eosinophils Absolute: 0.1 10*3/uL (ref 0–0.7)
HEMATOCRIT: 28.1 % — AB (ref 35.0–47.0)
Hemoglobin: 9.3 g/dL — ABNORMAL LOW (ref 12.0–16.0)
LYMPHS ABS: 2.9 10*3/uL (ref 1.0–3.6)
LYMPHS PCT: 39 %
MCH: 26.9 pg (ref 26.0–34.0)
MCHC: 33.1 g/dL (ref 32.0–36.0)
MCV: 81.1 fL (ref 80.0–100.0)
Monocytes Absolute: 0.7 10*3/uL (ref 0.2–0.9)
Monocytes Relative: 9 %
Neutro Abs: 3.8 10*3/uL (ref 1.4–6.5)
Neutrophils Relative %: 50 %
PLATELETS: 111 10*3/uL — AB (ref 150–440)
RBC: 3.47 MIL/uL — AB (ref 3.80–5.20)
RDW: 16.6 % — ABNORMAL HIGH (ref 11.5–14.5)
WBC: 7.4 10*3/uL (ref 3.6–11.0)

## 2015-11-24 LAB — BASIC METABOLIC PANEL
Anion gap: 6 (ref 5–15)
BUN: 14 mg/dL (ref 6–20)
CHLORIDE: 91 mmol/L — AB (ref 101–111)
CO2: 35 mmol/L — AB (ref 22–32)
Calcium: 9 mg/dL (ref 8.9–10.3)
Creatinine, Ser: 1.02 mg/dL — ABNORMAL HIGH (ref 0.44–1.00)
GFR calc Af Amer: >60 mL/min (ref >60)
GFR, EST NON AFRICAN AMERICAN: 53 mL/min — AB (ref >60)
GLUCOSE: 160 mg/dL — AB (ref 65–99)
POTASSIUM: 3.7 mmol/L (ref 3.5–5.1)
Sodium: 132 mmol/L — ABNORMAL LOW (ref 135–145)

## 2015-11-24 LAB — TYPE AND SCREEN
ABO/RH(D): B POS
Antibody Screen: NEGATIVE
UNIT DIVISION: 0

## 2015-11-24 LAB — GLUCOSE, CAPILLARY
Glucose-Capillary: 137 mg/dL — ABNORMAL HIGH (ref 65–99)
Glucose-Capillary: 234 mg/dL — ABNORMAL HIGH (ref 65–99)

## 2015-11-24 MED ORDER — INSULIN GLARGINE 100 UNIT/ML ~~LOC~~ SOLN: 5.0000 [IU] | Freq: Every day | SUBCUTANEOUS | 11 refills | Status: AC

## 2015-11-24 MED ORDER — METOPROLOL TARTRATE 25 MG PO TABS: 25.0000 mg | ORAL_TABLET | Freq: Two times a day (BID) | ORAL | 0 refills | Status: AC

## 2015-11-24 NOTE — Progress Notes (Signed)
Pt being discharged back to [facility] today. Report called to [facility], all questions answered. PIV removed. Pt and family are aware and in agreement with plan. Discharge packet provided, including any needed prescriptions. She is leaving with all her belongings, will be transported via EMS.

## 2015-11-24 NOTE — Discharge Summary (Addendum)
[facility] NAME: [name]    MR#:  [id number]  DATE OF BIRTH:  Jul 18, 1940  DATE OF ADMISSION:  11/19/2015 ADMITTING PHYSICIAN: Fritzi Mandes, MD  DATE OF DISCHARGE: 11/24/2015  PRIMARY CARE PHYSICIAN: Pcp Not In System   ADMISSION DIAGNOSIS:  Acute respiratory failure with hypercapnia (HCC) [J96.02] Acute encephalopathy [G93.40]  DISCHARGE DIAGNOSIS:  Active Problems:   Encephalopathy acute   SECONDARY DIAGNOSIS:   Past Medical History:  Diagnosis Date  . Arthritis   . Diabetes mellitus without complication ([place])   . GERD (gastroesophageal reflux disease)   . HLD (hyperlipidemia)   . Hypertension   . MI (myocardial infarction)    1990s  . Non Hodgkin's lymphoma ([place]) 2009   s/p stem cell transplant  . Spinal stenosis   . TIA (transient ischemic attack)      ADMITTING HISTORY  [name]  is a 75 y.o. female with a known history of MI, hypertension, GERD, diabetes who is a resident of [facility] comes to the emergency room after she was found unresponsive by nursing home staff. Patient had not taken her narcotics in a few weeks. She was given Narcan without any response. She was found to have left-sided weakness according to the daughter. This weakness was noted Sunday evening and was started on some aspirin. Came to the emergency room she was found to be hypoxic and unresponsive. She was immediately placed on a BiPAP. Workup in the emergency room showed acute hypoxic hypercarbic respiratory failure with CHF and acute encephalopathy likely due to hypercarbia. CT head negative for acute stroke. Unable to do MRI since patient is on BiPAP. I have ordered 20 mg of IV Lasix. Daughter in the room answers most of my questions. Patient is not much active but able to feed herself. She did not have left-sided weakness couple days ago. Patient is a DO NOT RESUSCITATE and this was confirmed by daughter she carries a yellow DO  NOT RESUSCITATE form.  HOSPITAL COURSE:   Tricia Dixonis a 75 y.o. femalewith a known history of MI, hypertension, GERD, diabetes who is a resident of [facility] comes to the emergency room after she was found unresponsive by nursing home staff. Patient had not taken her narcotics in a few weeks. She was given Narcan without any response. She was found to have left-sided weakness according to the daughter. This weakness was noted Sunday evening and was started on some aspirin. Came to the emergency room she was found to be hypoxic and unresponsive. She was immediately placed on a BiPAP. Workup in the emergency room showed acute hypoxic hypercarbic respiratory failure with CHF and acute encephalopathy likely due to hypercarbia.   1.Acute encephalopathy, resolved patient is currently at baseline. - CT head negative for acute stroke -MRI of the brain negative for any acute intracranial finding including infarct. -Carotid Dopplers done and revealed less than 50% stenosis in the right and left internal carotid arteries however I things are worrisome for abnormal left cervical adenopathy and malignancy cannot be excluded.  -I have discontinued gabapentin secondary to severelethargy.  2. Acute hypoxic hypercarbic respiratory failure secondary to congestive heart failure acute diastolic with possible COPD. Patient has been a former smoker. -Lasix 40 mg daily, bronchodilators as needed -Echo in the past showed EF of 65% echo was done in 2016. -Pulmonary consultationand recommendations appreciated.  3. Left-sided weakness per family.  -CT and MRI head negative for stroke -Resume aspirin  4. Anemia, acute on chronic, iron deficiency Oncology consulted and suggested Outpatient follow-up at the cancer center with Dr. Grayland Ormond in 1 week. Will likely need biopsy, PET/CT scan  Transfused 1 unit packed RBC. Hemoglobin improved.  5. Hypertension, uncontrolled  On lisinopril and  Norvasc.  6. Cervical lymphadenopathy, concerning for malignancy.  Given history of NHL treated with stem cell transplant in 2009, consulted oncology. Suggested outpatient follow-up at the cancer center in one week.  5. Diabetes -Sliding scale insulin Continue home medication  4. History of MI -Continue aspirin and cardiac meds.   Stable for discharge back to her long-term care facility.  CONSULTS OBTAINED:  Treatment Team:  Lloyd Huger, MD  DRUG ALLERGIES:   Allergies  Allergen Reactions  . Penicillins Hives    DISCHARGE MEDICATIONS:   Current Discharge Medication List    START taking these medications   Details  metoprolol tartrate (LOPRESSOR) 25 MG tablet Take 1 tablet (25 mg total) by mouth 2 (two) times daily. Qty: 60 tablet, Refills: 0      CONTINUE these medications which have CHANGED   Details  insulin glargine (LANTUS) 100 UNIT/ML injection Inject 0.05 mLs (5 Units total) into the skin at bedtime. Qty: 10 mL, Refills: 11      CONTINUE these medications which have NOT CHANGED   Details  aspirin EC 81 MG tablet Take 81 mg by mouth daily.    brimonidine-timolol (COMBIGAN) 0.2-0.5 % ophthalmic solution Place 1 drop into both eyes every 12 (twelve) hours.    Carboxymethylcellul-Glycerin (OPTIVE) 0.5-0.9 % SOLN Apply 1-2 drops to eye 3 (three) times daily as needed.    ferrous sulfate 325 (65 FE) MG tablet Take 325 mg by mouth daily. Monday, Wednesday, friday    fluticasone (FLONASE) 50 MCG/ACT nasal spray Place 1 spray into both nostrils daily.    furosemide (LASIX) 40 MG tablet Take 1 tablet (40 mg total) by mouth daily. Qty: 30 tablet, Refills: 0    gabapentin (NEURONTIN) 300 MG capsule TAKE ONE CAPSULE BY MOUTH EVERY 8 HOURS FOR PAIN Refills: 0    insulin aspart (NOVOLOG) 100 UNIT/ML injection Inject 0-9 Units into the skin 3 (three) times daily with meals. Qty: 10 mL, Refills: 11    ipratropium-albuterol (DUONEB) 0.5-2.5 (3) MG/3ML SOLN  Take 3 mLs by nebulization every 4 (four) hours as needed.    lisinopril (PRINIVIL,ZESTRIL) 40 MG tablet Take 40 mg by mouth daily.    loratadine (CLARITIN) 10 MG tablet Take 10 mg by mouth daily.    Multiple Vitamin (MULTI-VITAMINS) TABS Take 1 tablet by mouth.    oxyCODONE (OXY IR/ROXICODONE) 5 MG immediate release tablet Take 1 tablet by mouth 4 (four) times daily.    polyethylene glycol (MIRALAX / GLYCOLAX) packet Take 17 g by mouth daily as needed.    promethazine (PHENERGAN) 12.5 MG tablet Take 1 tablet by mouth.    ranitidine (ZANTAC) 150 MG tablet Take 150 mg by mouth at bedtime.    sodium chloride 1 G tablet Take 1 tablet (1 g total) by mouth 2 (two) times daily with a meal. Qty: 20 tablet, Refills: 0    Vitamin D, Ergocalciferol, (DRISDOL) 50000 UNITS CAPS capsule Take 50,000 Units by mouth every 30 (thirty) days.    Zinc Oxide (DESITIN) 13 % CREA Apply topically.    acetaminophen (TYLENOL) 500 MG tablet Take 500 mg by mouth every 8 (eight) hours as needed.    amLODipine (NORVASC) 5 MG tablet Take 5 mg by mouth daily.  polyethylene glycol-electrolytes (TRILYTE) 420 g solution Take 4,000 mLs by mouth as directed. Drink one 8 oz glass every 30 mins until stools are clear. Qty: 4000 mL, Refills: 0        Today   VITAL SIGNS:  Blood pressure (!) 145/78, pulse 71, temperature 98.6 F (37 C), temperature source Oral, resp. rate 17, height 5\' 6"  (1.676 m), weight 102.1 kg (225 lb), SpO2 100 %.  I/O:   Intake/Output Summary (Last 24 hours) at 11/24/15 1038 Last data filed at 11/23/15 1856  Gross per 24 hour  Intake              240 ml  Output                0 ml  Net              240 ml    PHYSICAL EXAMINATION:  Physical Exam  GENERAL:  75 y.o.-year-old patient lying in the bed with no acute distress. Obese LUNGS: Normal breath sounds bilaterally, no wheezing, rales,rhonchi or crepitation. No use of accessory muscles of respiration.  CARDIOVASCULAR: S1, S2  normal. No murmurs, rubs, or gallops.  ABDOMEN: Soft, non-tender, non-distended. Bowel sounds present. No organomegaly or mass.  NEUROLOGIC: Moves all 4 extremities. PSYCHIATRIC: The patient is alert and oriented x 3.  SKIN: No obvious rash, lesion, or ulcer.   DATA REVIEW:   CBC  Recent Labs Lab 11/24/15 0444  WBC 7.4  HGB 9.3*  HCT 28.1*  PLT 111*    Chemistries   Recent Labs Lab 11/19/15 1158  11/24/15 0444  NA 138  < > 132*  K 4.9  < > 3.7  CL 97*  < > 91*  CO2 36*  < > 35*  GLUCOSE 83  < > 160*  BUN 23*  < > 14  CREATININE 1.00  < > 1.02*  CALCIUM 9.0  < > 9.0  AST 30  --   --   ALT 17  --   --   ALKPHOS 78  --   --   BILITOT 0.7  --   --   < > = values in this interval not displayed.  Cardiac Enzymes  Recent Labs Lab 11/19/15 1158  TROPONINI 0.04*    Microbiology Results  Results for orders placed or performed during the hospital encounter of 11/19/15  MRSA PCR Screening     Status: None   Collection Time: 11/19/15  4:40 PM  Result Value Ref Range Status   MRSA by PCR NEGATIVE NEGATIVE Final    Comment:        The GeneXpert MRSA Assay (FDA approved for NASAL specimens only), is one component of a comprehensive MRSA colonization surveillance program. It is not intended to diagnose MRSA infection nor to guide or monitor treatment for MRSA infections.     RADIOLOGY:  No results found.  Follow up with PCP in 1 week.  Management plans discussed with the patient, family and they are in agreement.  CODE STATUS:     Code Status Orders        Start     Ordered   11/19/15 1533  Do not attempt resuscitation (DNR)  Continuous    Question Answer Comment  In the event of cardiac or respiratory ARREST Do not call a "code blue"   In the event of cardiac or respiratory ARREST Do not perform Intubation, CPR, defibrillation or ACLS   In the event of cardiac or respiratory ARREST  Use medication by any route, position, wound care, and other  measures to relive pain and suffering. May use oxygen, suction and manual treatment of airway obstruction as needed for comfort.      11/19/15 1532    Code Status History    Date Active Date Inactive Code Status Order ID Comments User Context   11/14/2014  2:35 AM 11/20/2014 11:17 PM DNR [id number]  Lance Coon, MD Inpatient   11/01/2014  1:31 AM 11/04/2014 10:31 PM Full Code [id number]  Lance Coon, MD Inpatient    Advance Directive Documentation   Flowsheet Row Most Recent Value  Type of Advance Directive  Out of facility DNR (pink MOST or yellow form)  Pre-existing out of facility DNR order (yellow form or pink MOST form)  Yellow form placed in chart (order not valid for inpatient use)  "MOST" Form in Place?  No data      TOTAL TIME TAKING CARE OF THIS PATIENT ON DAY OF DISCHARGE: more than 30 minutes.   Hillary Bow R M.D on 11/24/2015 at 10:38 AM  Between 7am to 6pm - Pager - [phone]  After 6pm go to [email or web address] - password [email or web address] [facility]  Office  [phone]  CC: Primary care physician; Pcp Not In System  Note: This dictation was prepared with Dragon dictation along with smaller phrase technology. Any transcriptional errors that result from this process are unintentional.

## 2015-11-24 NOTE — Progress Notes (Addendum)
Patient is medically stable for discharge back to [facility]. Per, [place] admission coordinator at [facility], patient can come to room 123-A. Clinical Education officer, museum (CSW) sent discharge orders in [place]. Social work Theatre manager met with patient at bedside to explain that patient will discharge today. Patient was asleep. Social work Theatre manager called patient's daughter [name] making her aware of above. RN will call report to [facility] and arrange EMS for transport. Please re-consult if future social work needs arise. Social work Architectural technologist off.   Danie Chandler, Social Work Intern  [phone]

## 2015-11-24 NOTE — Care Management Important Message (Signed)
Important Message  Patient Details  Name: [name] MRN: [id number] Date of Birth: [birth year]   Medicare Important Message Given:  Yes    Jolly Mango, RN 11/24/2015, 10:47 AM

## 2015-12-01 DIAGNOSIS — C8310 Mantle cell lymphoma, unspecified site: Secondary | ICD-10-CM | POA: Insufficient documentation

## 2015-12-01 NOTE — Progress Notes (Signed)
[place]  [phone] [phone]) [phone]  ID: [name] OB: December 06, 1940  MR#: [id number]  [id number]  Patient Care Team: Pcp Not In System as PCP - General  CHIEF COMPLAINT: Mantle cell lymphoma.  INTERVAL HISTORY: Patient was last evaluated in clinic in January 2017. She has had multiple hospital admissions since that time and returns to clinic now as a hospital follow-up and discussion of her mantle cell lymphoma. Patient is lethargic with a performance status at best a 3 and the entire history is given by her sister and 2 daughters. She continues to have increased weakness and fatigue. She has chronic pain. She continues to have shortness of breath. 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 She has a poor appetite, but there is no report of weight loss. She denies any fevers or night sweats.   REVIEW OF SYSTEMS:   Review of Systems  Constitutional: Positive for malaise/fatigue. Negative for fever and weight loss.  Respiratory: Positive for cough and shortness of breath.   Cardiovascular: Positive for leg swelling. Negative for chest pain.  Gastrointestinal: Positive for abdominal pain.  Genitourinary: Negative.   Musculoskeletal: Positive for neck pain.  Neurological: Positive for focal weakness and weakness.  Psychiatric/Behavioral: Positive for memory loss.    As per HPI. Otherwise, a complete review of systems is negative.  PAST MEDICAL HISTORY: Past Medical History:  Diagnosis Date  . Arthritis   . Diabetes mellitus without complication ([place])   . GERD (gastroesophageal reflux disease)   . HLD (hyperlipidemia)   . Hypertension   . MI (myocardial infarction)    1990s  . Non Hodgkin's lymphoma ([place]) 2009   s/p stem cell transplant  . Spinal stenosis   . TIA (transient ischemic attack)     PAST SURGICAL HISTORY: Past Surgical History:  Procedure Laterality Date  . ABDOMINAL HYSTERECTOMY    . EYE SURGERY  Bilateral   . PERIPHERAL VASCULAR CATHETERIZATION N/A 04/01/2015   Procedure: Abdominal Aortogram w/Lower Extremity;  Surgeon: Katha Cabal, MD;  Location: [facility];  Service: Cardiovascular;  Laterality: N/A;  . PERIPHERAL VASCULAR CATHETERIZATION  04/01/2015   Procedure: Lower Extremity Intervention;  Surgeon: Katha Cabal, MD;  Location: [facility];  Service: Cardiovascular;;  . TOE AMPUTATION Left     FAMILY HISTORY Family History  Problem Relation Age of Onset  . Hypertension Mother   . Heart disease Mother   . Diabetes Father   . Breast cancer Sister        ADVANCED DIRECTIVES:    HEALTH MAINTENANCE: Social History  Substance Use Topics  . Smoking status: Former Research scientist (life sciences)  . Smokeless tobacco: Never Used  . Alcohol use No     Colonoscopy:  PAP:  Bone density:  Lipid panel:  Allergies  Allergen Reactions  . Penicillins Hives    Current Outpatient Prescriptions  Medication Sig Dispense Refill  . acetaminophen (TYLENOL) 500 MG tablet Take 500 mg by mouth every 8 (eight) hours as needed.    Marland Kitchen amLODipine (NORVASC) 5 MG tablet Take 5 mg by mouth daily.    Marland Kitchen aspirin EC 81 MG tablet Take 81 mg by mouth daily.    . brimonidine-timolol (COMBIGAN) 0.2-0.5 % ophthalmic solution Place 1 drop into both eyes every 12 (twelve) hours.    . Carboxymethylcellul-Glycerin (OPTIVE) 0.5-0.9 % SOLN Apply 1-2 drops to eye 3 (three) times daily as needed.    . ferrous sulfate 325 (65 FE) MG tablet Take  325 mg by mouth daily. Monday, Wednesday, friday    . fluticasone (FLONASE) 50 MCG/ACT nasal spray Place 1 spray into both nostrils daily.    . furosemide (LASIX) 40 MG tablet Take 1 tablet (40 mg total) by mouth daily. 30 tablet 0  . gabapentin (NEURONTIN) 300 MG capsule TAKE ONE CAPSULE BY MOUTH EVERY 8 HOURS FOR PAIN  0  . insulin aspart (NOVOLOG) 100 UNIT/ML injection Inject 0-9 Units into the skin 3 (three) times daily with meals. (Patient taking  differently: Inject 0-9 Units into the skin 3 (three) times daily with meals. 0-99=0 units 100-149=2 units 150-199=3 units 200-249=4 units 250-299=5 units 300-349=6 units 350-399=7 units 400-449=8 units >/=450 notify md) 10 mL 11  . insulin glargine (LANTUS) 100 UNIT/ML injection Inject 0.05 mLs (5 Units total) into the skin at bedtime. 10 mL 11  . ipratropium-albuterol (DUONEB) 0.5-2.5 (3) MG/3ML SOLN Take 3 mLs by nebulization every 4 (four) hours as needed.    Marland Kitchen lisinopril (PRINIVIL,ZESTRIL) 40 MG tablet Take 40 mg by mouth daily.    Marland Kitchen loratadine (CLARITIN) 10 MG tablet Take 10 mg by mouth daily.    . metoprolol tartrate (LOPRESSOR) 25 MG tablet Take 1 tablet (25 mg total) by mouth 2 (two) times daily. 60 tablet 0  . Multiple Vitamin (MULTI-VITAMINS) TABS Take 1 tablet by mouth.    . oxyCODONE (OXY IR/ROXICODONE) 5 MG immediate release tablet Take 1 tablet by mouth 4 (four) times daily.    . polyethylene glycol (MIRALAX / GLYCOLAX) packet Take 17 g by mouth daily as needed.    . polyethylene glycol-electrolytes (TRILYTE) 420 g solution Take 4,000 mLs by mouth as directed. Drink one 8 oz glass every 30 mins until stools are clear. 4000 mL 0  . ranitidine (ZANTAC) 150 MG tablet Take 150 mg by mouth at bedtime.    . sodium chloride 1 G tablet Take 1 tablet (1 g total) by mouth 2 (two) times daily with a meal. 20 tablet 0  . Vitamin D, Ergocalciferol, (DRISDOL) 50000 UNITS CAPS capsule Take 50,000 Units by mouth every 30 (thirty) days.    . Zinc Oxide (DESITIN) 13 % CREA Apply topically.     No current facility-administered medications for this visit.     OBJECTIVE: Vitals:   12/02/15 1108  BP: (!) 143/77  Pulse: 78  Resp: 20  Temp: 97.6 F (36.4 C)     There is no height or weight on file to calculate BMI.    ECOG FS:4 - Bedbound  General: Ill-appearing. Eyes: Pink conjunctiva, anicteric sclera. Lungs: Clear to auscultation bilaterally. Heart: Regular rate and rhythm. No rubs,  murmurs, or gallops. Abdomen: Soft, nontender, nondistended. No organomegaly noted, normoactive bowel sounds. Musculoskeletal: No edema, cyanosis, or clubbing.  Neuro: Lethargic. Cranial nerves grossly intact. Skin: No rashes or petechiae noted. Psych: Normal affect.   LAB RESULTS:  Lab Results  Component Value Date   NA 132 (L) 11/24/2015   K 3.7 11/24/2015   CL 91 (L) 11/24/2015   CO2 35 (H) 11/24/2015   GLUCOSE 160 (H) 11/24/2015   BUN 14 11/24/2015   CREATININE 1.02 (H) 11/24/2015   CALCIUM 9.0 11/24/2015   PROT 7.7 11/19/2015   ALBUMIN 3.7 11/19/2015   AST 30 11/19/2015   ALT 17 11/19/2015   ALKPHOS 78 11/19/2015   BILITOT 0.7 11/19/2015   GFRNONAA 53 (L) 11/24/2015   GFRAA >60 11/24/2015    Lab Results  Component Value Date   WBC 7.4 11/24/2015  NEUTROABS 3.8 11/24/2015   HGB 9.3 (L) 11/24/2015   HCT 28.1 (L) 11/24/2015   MCV 81.1 11/24/2015   PLT 111 (L) 11/24/2015     Mr Brain Wo Contrast  Result Date: 11/21/2015 CLINICAL DATA:  Altered mental status EXAM: MRI HEAD WITHOUT CONTRAST TECHNIQUE: Multiplanar, multiecho pulse sequences of the brain and surrounding structures were obtained without intravenous contrast. COMPARISON:  11/19/2015 head CT FINDINGS: Brain: No acute infarction, hemorrhage, hydrocephalus, extra-axial collection or mass lesion. Generalized atrophy that is mild for age. Halo of FLAIR hyperintensity around the lateral ventricles attributed to chronic microvascular disease. Vascular: Preserved flow voids Skull and upper cervical spine: No focal lesion. Sinuses/Orbits: Generalized mucosal edema in the paranasal sinuses with right maxillary fluid level and near complete opacification. Bilateral mastoid fluid. IMPRESSION: 1. No acute intracranial finding, including infarct. 2. Mild chronic microvascular disease. 3. Bilateral sinusitis and mastoiditis with right maxillary fluid level. Electronically Signed   By: Monte Fantasia M.D.   On: 11/21/2015  12:47   US Carotid Bilateral  Result Date: 11/21/2015 CLINICAL DATA:  Altered mental status EXAM: BILATERAL CAROTID DUPLEX ULTRASOUND TECHNIQUE: Pearline Cables scale imaging, color Doppler and duplex ultrasound were performed of bilateral carotid and vertebral arteries in the neck. COMPARISON:  None. FINDINGS: Criteria: Quantification of carotid stenosis is based on velocity parameters that correlate the residual internal carotid diameter with NASCET-based stenosis levels, using the diameter of the distal internal carotid lumen as the denominator for stenosis measurement. The following velocity measurements were obtained: RIGHT ICA:  60 cm/sec CCA:  50 cm/sec SYSTOLIC ICA/CCA RATIO:  1.2 DIASTOLIC ICA/CCA RATIO:  5.6 ECA:  67 cm/sec LEFT ICA:  72 cm/sec CCA:  54 cm/sec SYSTOLIC ICA/CCA RATIO:  1.3 DIASTOLIC ICA/CCA RATIO:  2.4 ECA:  39 cm/sec RIGHT CAROTID ARTERY: Minimal intimal thickening in the bulb. Low resistance internal carotid Doppler pattern. RIGHT VERTEBRAL ARTERY:  Antegrade LEFT CAROTID ARTERY: Minimal soft smooth plaque in the bulb. Low resistance internal carotid Doppler pattern. Additional findings: Abnormally enlarged an appearing lymph nodes in the left side of the neck are noted. There are hypoechoic masses without a fatty hilum. Short axis diameter of 2 of these areas is 14 and 12 mm. IMPRESSION: Less than 50% stenosis in the right and left internal carotid arteries. Findings are worrisome for abnormal left cervical adenopathy. Malignancy is not excluded. CT neck with contrast is recommended. Electronically Signed   By: Marybelle Killings M.D.   On: 11/21/2015 12:35   US Venous Img Upper Uni Left  Result Date: 11/20/2015 CLINICAL DATA:  Left upper extremity edema EXAM: LEFT UPPER EXTREMITY VENOUS DOPPLER ULTRASOUND TECHNIQUE: Gray-scale sonography with graded compression, as well as color Doppler and duplex ultrasound were performed to evaluate the upper extremity deep venous system from the level of  the subclavian vein and including the jugular, axillary, basilic, radial, ulnar and upper cephalic vein. Spectral Doppler was utilized to evaluate flow at rest and with distal augmentation maneuvers. COMPARISON:  None. FINDINGS: Contralateral Subclavian Vein: Respiratory phasicity is normal and symmetric with the symptomatic side. No evidence of thrombus. Normal compressibility. Internal Jugular Vein: No evidence of thrombus. Normal compressibility, respiratory phasicity and response to augmentation. Subclavian Vein: No evidence of thrombus. Normal compressibility, respiratory phasicity and response to augmentation. Axillary Vein: No evidence of thrombus. Normal compressibility, respiratory phasicity and response to augmentation. Cephalic Vein: No evidence of thrombus. Normal compressibility, respiratory phasicity and response to augmentation. Basilic Vein: No evidence of thrombus. Normal compressibility, respiratory phasicity and response to  augmentation. Brachial Veins: No evidence of thrombus. Normal compressibility, respiratory phasicity and response to augmentation. Radial Veins: No evidence of thrombus. Normal compressibility, respiratory phasicity and response to augmentation. Ulnar Veins: No evidence of thrombus. Normal compressibility, respiratory phasicity and response to augmentation. Venous Reflux:  None visualized. Other Findings:  None visualized. IMPRESSION: No evidence of deep venous thrombosis. Electronically Signed   By: Jerilynn Mages.  Shick M.D.   On: 11/20/2015 09:53   Dg Chest Port 1 View  Result Date: 11/20/2015 CLINICAL DATA:  Acute onset of respiratory failure. Initial encounter. EXAM: PORTABLE CHEST 1 VIEW COMPARISON:  Chest radiograph performed 11/19/2015 FINDINGS: The lungs are well-aerated. Mild vascular congestion is noted. Mildly increased interstitial markings could reflect minimal interstitial edema. Small bilateral pleural effusions are seen. There is no evidence of pneumothorax. The  cardiomediastinal silhouette is borderline enlarged. No acute osseous abnormalities are seen. IMPRESSION: Mild vascular congestion and borderline cardiomegaly. Mildly increased interstitial markings could reflect minimal interstitial edema. Small bilateral pleural effusions noted. Electronically Signed   By: Garald Balding M.D.   On: 11/20/2015 04:03   Dg Chest Portable 1 View  Result Date: 11/19/2015 CLINICAL DATA:  Unresponsive. History of coronary artery disease, previous MI, non-Hodgkin's lymphoma, former smoker. EXAM: PORTABLE CHEST 1 VIEW COMPARISON:  Chest x-ray of November 15, 2014 FINDINGS: The lungs are adequately inflated. There are pleural effusions bilaterally greatest on the right. The interstitial markings are increased. The cardiac silhouette is enlarged. The pulmonary vascularity is mildly engorged. The mediastinum is subjectively widened. The bony thorax is unremarkable. IMPRESSION: CHF with mild interstitial edema and small bilateral pleural effusions greatest on the right. Electronically Signed   By: David  Martinique M.D.   On: 11/19/2015 13:24   Ct Head Code Stroke W/o Cm  Result Date: 11/19/2015 CLINICAL DATA:  Code stroke. Stroke symptoms. Possible right facial droop and right-sided weakness. History of non-Hodgkin's lymphoma. EXAM: CT HEAD WITHOUT CONTRAST TECHNIQUE: Contiguous axial images were obtained from the base of the skull through the vertex without intravenous contrast. COMPARISON:  01/20/2015 FINDINGS: Brain: There is no evidence of acute cortical infarct, intracranial hemorrhage, mass, midline shift, or extra-axial fluid collection. Mild cerebral atrophy is unchanged and within normal limits for age. Periventricular white matter hypodensities are unchanged and nonspecific but compatible with mild chronic small vessel ischemic disease. Vascular: Calcified atherosclerosis at the skullbase. No hyperdense vessel. Skull: No fracture or focal osseous lesion. Sinuses/Orbits: Prior  bilateral cataract extraction. Near complete opacification of the ethmoid sinuses. Mucosal thickening and fluid in the sphenoid greater than frontal sinuses. Large amount of right maxillary sinus fluid. Other: Partially visualize nasal airway. ASPECTS [facility] Stroke Program Early CT Score) - Ganglionic level infarction (caudate, lentiform nuclei, internal capsule, insula, M1-M3 cortex): 7 - Supraganglionic infarction (M4-M6 cortex): 3 Total score (0-10 with 10 being normal): 10 IMPRESSION: 1. No evidence of acute intracranial abnormality. 2. ASPECTS is 10. 3. Mild chronic small vessel ischemic disease. These results were called by telephone at the time of interpretation on 11/19/2015 at 12:26 pm to Dr. Merlyn Lot , who verbally acknowledged these results. Electronically Signed   By: Logan Bores M.D.   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  PLAN:    1. Mantle cell lymphoma: Given patient's multiple comorbidities and significantly decreased performance status, no further treatment or imaging is recommended. If patient had progression of disease, she would not be able to tolerate any treatment. There is  also no need to refer patient for biopsy to confirm recurrence. After lengthy discussion with patient's family, they agreed that treatment would be detrimental.  No further interventions are needed. No follow-up has been scheduled. 2. PET positive sigmoid lesion: Patient never underwent colonoscopy. This is not recommended at this time given her performance status.   Approximately 30 minutes was spent in discussion of which greater than 50% was consultation.  Patient expressed understanding and was in agreement with this plan. She also understands that She can call clinic at any time with any questions, concerns, or complaints.    Lloyd Huger, MD   12/06/2015 8:54 PM

## 2015-12-02 ENCOUNTER — Inpatient Hospital Stay: Payer: No Typology Code available for payment source | Attending: Oncology | Admitting: Oncology

## 2015-12-02 DIAGNOSIS — Z794 Long term (current) use of insulin: Secondary | ICD-10-CM | POA: Diagnosis not present

## 2015-12-02 DIAGNOSIS — R948 Abnormal results of function studies of other organs and systems: Secondary | ICD-10-CM | POA: Diagnosis not present

## 2015-12-02 DIAGNOSIS — M1990 Unspecified osteoarthritis, unspecified site: Secondary | ICD-10-CM | POA: Diagnosis not present

## 2015-12-02 DIAGNOSIS — E119 Type 2 diabetes mellitus without complications: Secondary | ICD-10-CM | POA: Diagnosis not present

## 2015-12-02 DIAGNOSIS — I10 Essential (primary) hypertension: Secondary | ICD-10-CM | POA: Diagnosis not present

## 2015-12-02 DIAGNOSIS — I252 Old myocardial infarction: Secondary | ICD-10-CM | POA: Insufficient documentation

## 2015-12-02 DIAGNOSIS — C8318 Mantle cell lymphoma, lymph nodes of multiple sites: Secondary | ICD-10-CM

## 2015-12-02 DIAGNOSIS — Z9481 Bone marrow transplant status: Secondary | ICD-10-CM | POA: Insufficient documentation

## 2015-12-02 DIAGNOSIS — Z7982 Long term (current) use of aspirin: Secondary | ICD-10-CM | POA: Insufficient documentation

## 2015-12-02 DIAGNOSIS — E785 Hyperlipidemia, unspecified: Secondary | ICD-10-CM | POA: Diagnosis not present

## 2015-12-02 DIAGNOSIS — K219 Gastro-esophageal reflux disease without esophagitis: Secondary | ICD-10-CM | POA: Insufficient documentation

## 2015-12-02 DIAGNOSIS — Z8673 Personal history of transient ischemic attack (TIA), and cerebral infarction without residual deficits: Secondary | ICD-10-CM | POA: Insufficient documentation

## 2015-12-02 DIAGNOSIS — Z79899 Other long term (current) drug therapy: Secondary | ICD-10-CM | POA: Diagnosis not present

## 2015-12-02 DIAGNOSIS — Z87891 Personal history of nicotine dependence: Secondary | ICD-10-CM | POA: Diagnosis not present

## 2015-12-02 NOTE — Progress Notes (Signed)
Patient here today for evaluation regarding mantle cell lymphoma, hospital follow up. Patient transported to clinic by EMS, family with patient.

## 2015-12-10 ENCOUNTER — Telehealth: Payer: Self-pay | Admitting: *Deleted

## 2015-12-10 NOTE — Telephone Encounter (Signed)
Patients daughter called in to request information on medical records, call return and voicemail left for patients daughter.

## 2016-01-04 DEATH — deceased

## 2017-11-24 IMAGING — MR MR HEAD W/O CM
10 series · 48 of 48 positions shown · non-contrast
Comparison: 11/19/2015 head CT

CLINICAL DATA: Altered mental status

EXAM:
MRI HEAD WITHOUT CONTRAST
TECHNIQUE: Multiplanar, multiecho pulse sequences of the brain and surrounding
structures were obtained without intravenous contrast.

[Series 3: GRE · sagittal · 5.0mm · 0.45mm/px · 5 of 27 slices shown (1 of 2)]
[im 1/27]
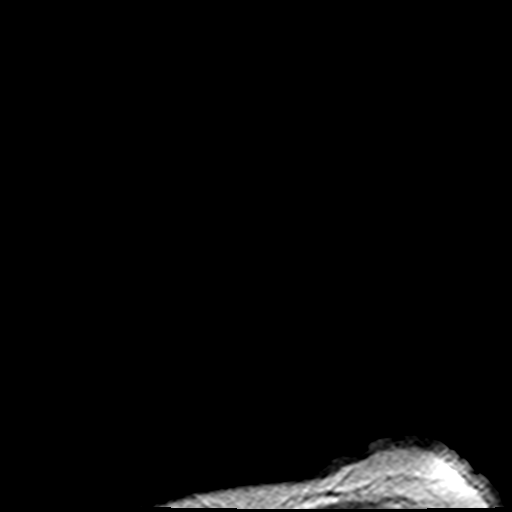
[im 7/27]
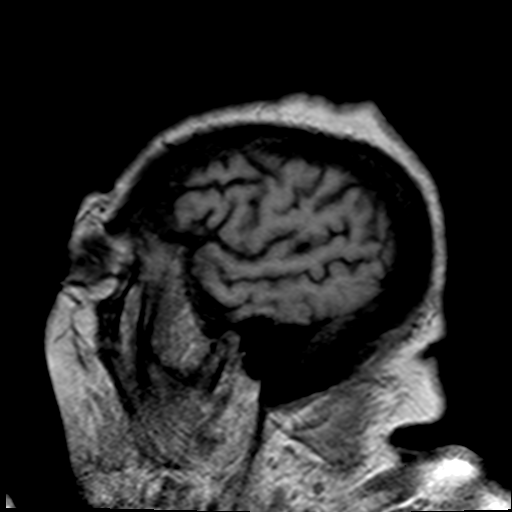
[im 14/27]
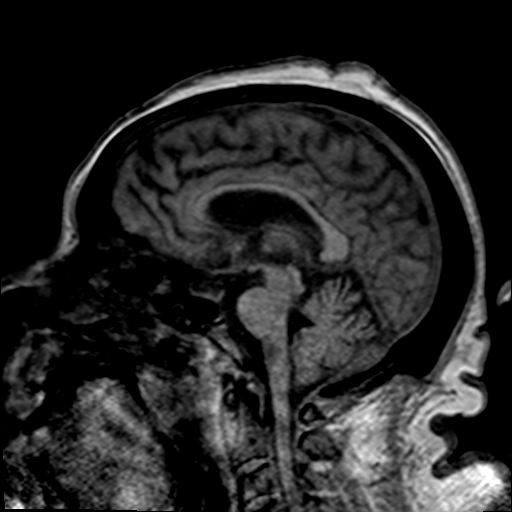
[im 20/27]
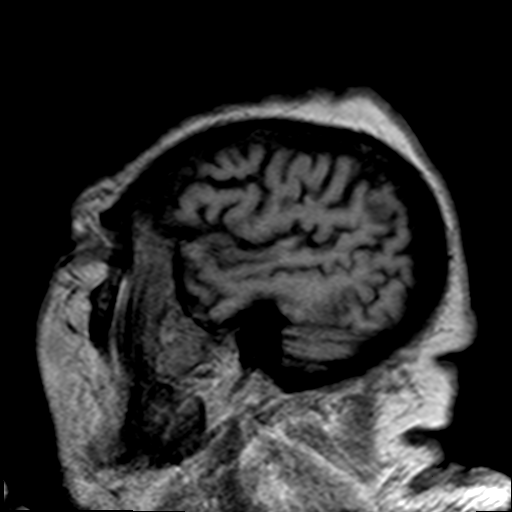
[im 27/27]
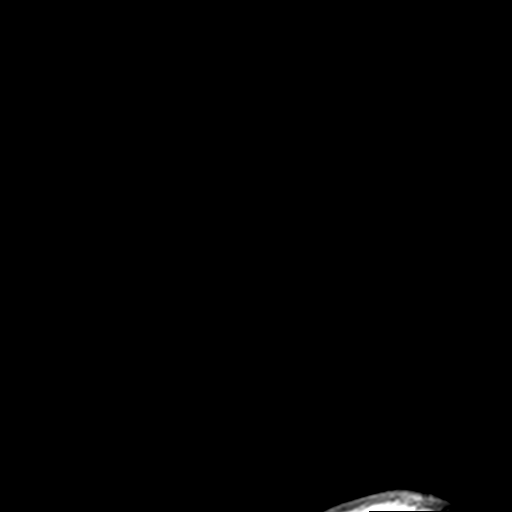

[Series 5: DWI · axial · 3.0mm · 1.80mm/px · z∈[-49,+104]mm · 7 of 55 slices shown (1 of 2)]
[im 1/55]
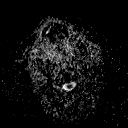
[im 10/55]
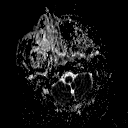
[im 19/55]
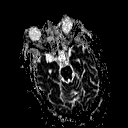
[im 28/55]
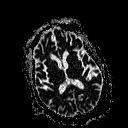
[im 37/55]
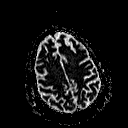
[im 46/55]
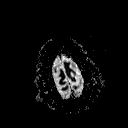
[im 55/55]
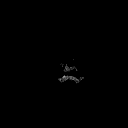

[Series 7: DWI · coronal · 3.0mm · 1.80mm/px · 7 of 49 slices shown (2 of 2)]
[im 1/49]
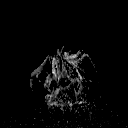
[im 9/49]
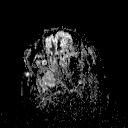
[im 17/49]
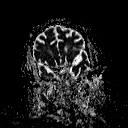
[im 25/49]
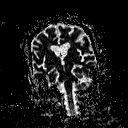
[im 33/49]
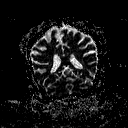
[im 41/49]
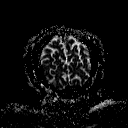
[im 49/49]
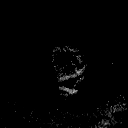

[Series 9: FLAIR · axial · 5.0mm · 0.45mm/px · z∈[-45,+103]mm · 3 of 25 slices shown]
[im 1/25]
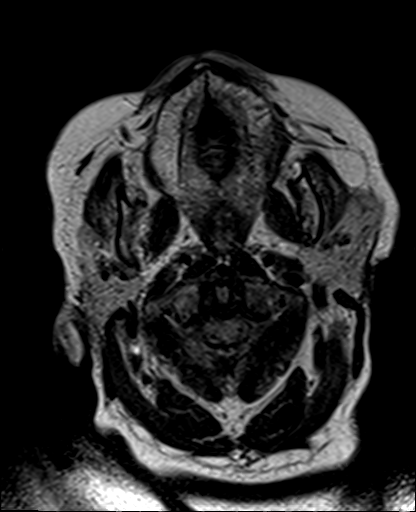
[im 13/25]
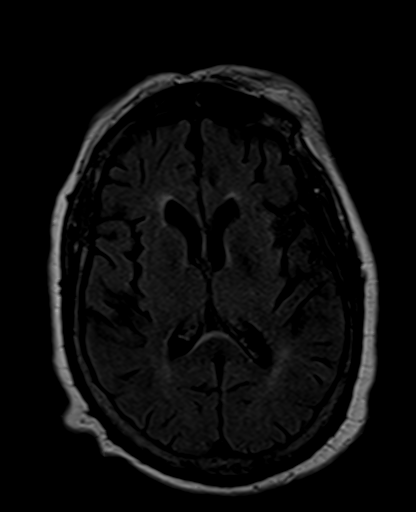
[im 25/25]
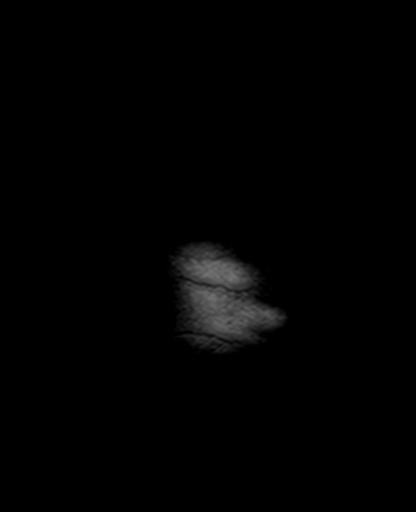

[Series 11: T2 · axial · 5.0mm · 0.45mm/px · z∈[-45,+103]mm · 3 of 25 slices shown (1 of 3)]
[im 1/25]
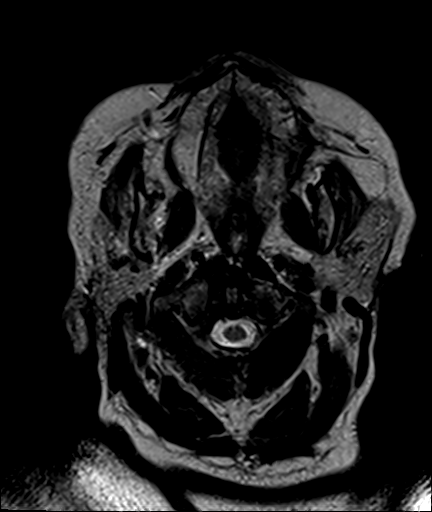
[im 13/25]
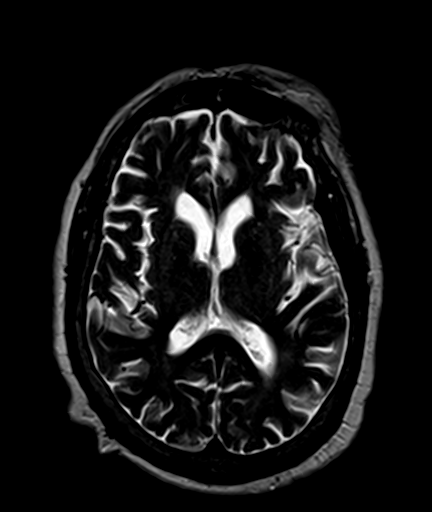
[im 25/25]
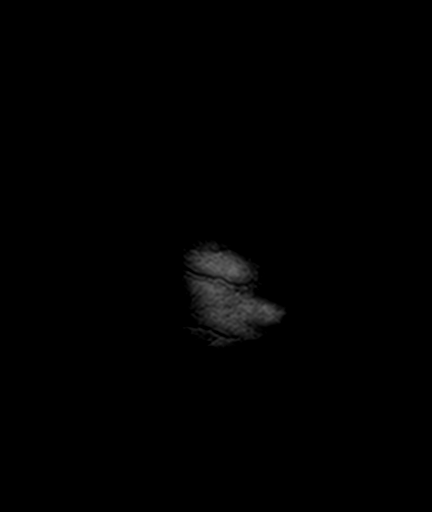

[Series 12: T2 · axial · 5.0mm · 1.20mm/px · z∈[-46,+102]mm · 3 of 25 slices shown (2 of 3)]
[im 1/25]
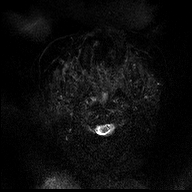
[im 13/25]
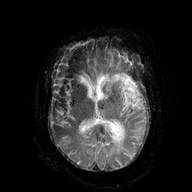
[im 25/25]
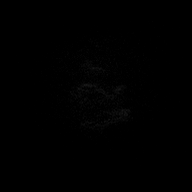

[Series 13: GRE · axial · 5.0mm · 0.45mm/px · z∈[-46,+102]mm · 3 of 25 slices shown (2 of 2)]
[im 1/25]
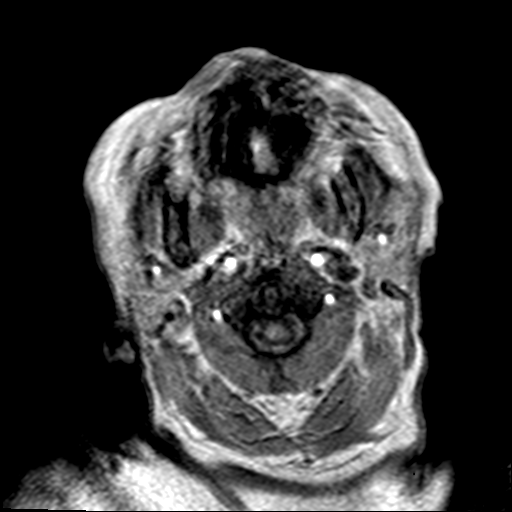
[im 13/25]
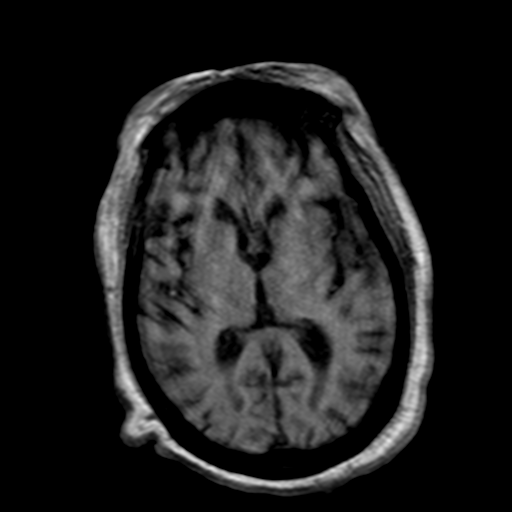
[im 25/25]
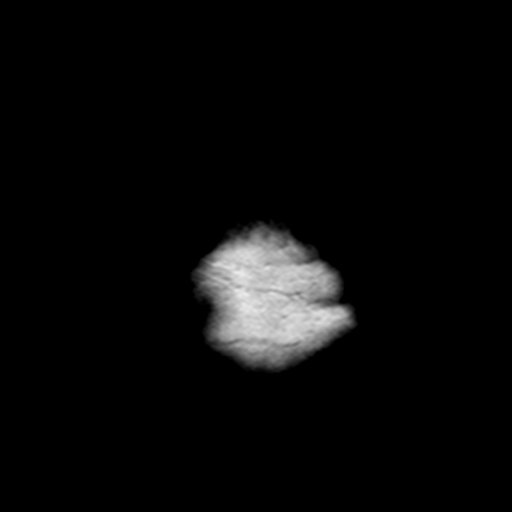

[Series 15: T2 · coronal · 5.0mm · 0.45mm/px · 3 of 25 slices shown (3 of 3)]
[im 1/25]
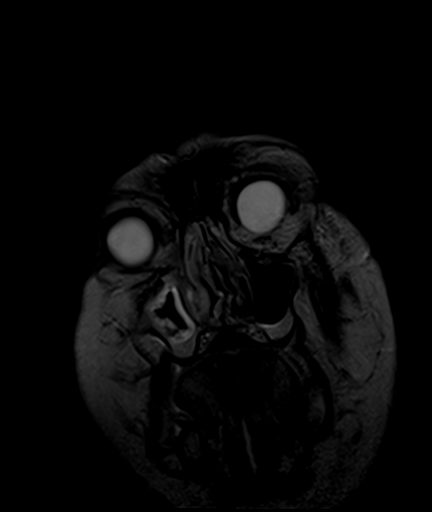
[im 13/25]
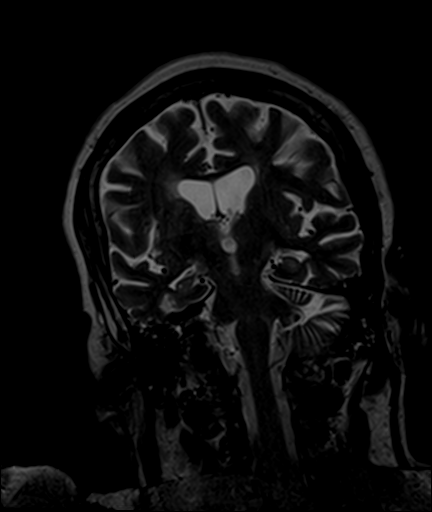
[im 25/25]
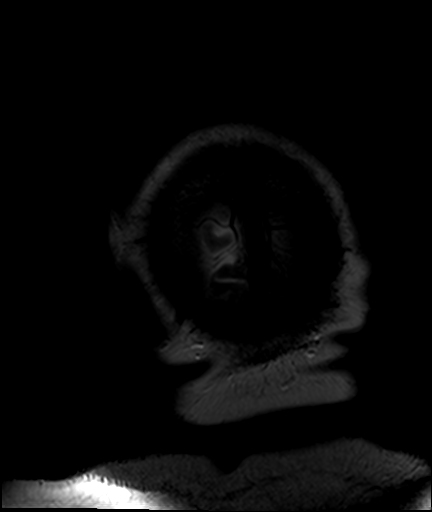

[Series 100: (id) · axial · 3.0mm · 1.80mm/px · z∈[-49,+104]mm · 7 of 55 slices shown]
[im 1/55]
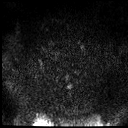
[im 10/55]
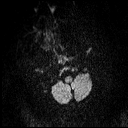
[im 19/55]
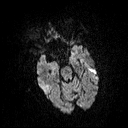
[im 28/55]
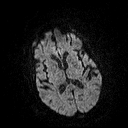
[im 37/55]
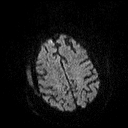
[im 46/55]
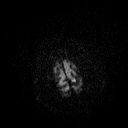
[im 55/55]
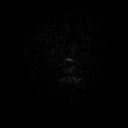

[Series 101: (id) cor · coronal · 3.0mm · 1.80mm/px · 7 of 49 slices shown]
[im 1/49]
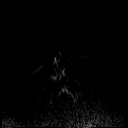
[im 9/49]
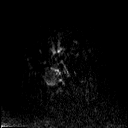
[im 17/49]
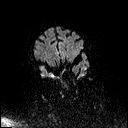
[im 25/49]
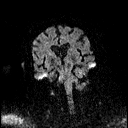
[im 33/49]
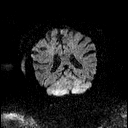
[im 41/49]
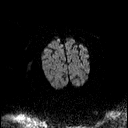
[im 49/49]
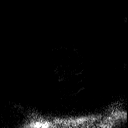

[48 of 48 positions shown; findings below may reference images not displayed]

FINDINGS: Brain: No acute infarction, hemorrhage, hydrocephalus, extra-axial
collection or mass lesion. Generalized atrophy that is mild for age.
Halo of FLAIR hyperintensity around the lateral ventricles
attributed to chronic microvascular disease.

Vascular: Preserved flow voids

Skull and upper cervical spine: No focal lesion.

Sinuses/Orbits: Generalized mucosal edema in the paranasal sinuses
with right maxillary fluid level and near complete opacification.
Bilateral mastoid fluid.
IMPRESSION: 1. No acute intracranial finding, including infarct.
2. Mild chronic microvascular disease.
3. Bilateral sinusitis and mastoiditis with right maxillary fluid
level.
